# Patient Record
Sex: Female | Born: 1935 | Race: White | Hispanic: No | Smoking: Never smoker
Health system: Southern US, Community
[De-identification: ages and names within clinical notes are randomized; demographics above are authoritative.]

## PROBLEM LIST (undated history)

## (undated) DIAGNOSIS — I10 Essential (primary) hypertension: Secondary | ICD-10-CM

## (undated) DIAGNOSIS — R011 Cardiac murmur, unspecified: Secondary | ICD-10-CM

## (undated) DIAGNOSIS — R202 Paresthesia of skin: Principal | ICD-10-CM

## (undated) DIAGNOSIS — R32 Unspecified urinary incontinence: Secondary | ICD-10-CM

## (undated) DIAGNOSIS — C50919 Malignant neoplasm of unspecified site of unspecified female breast: Secondary | ICD-10-CM

## (undated) DIAGNOSIS — F329 Major depressive disorder, single episode, unspecified: Secondary | ICD-10-CM

## (undated) DIAGNOSIS — H548 Legal blindness, as defined in USA: Secondary | ICD-10-CM

## (undated) DIAGNOSIS — C801 Malignant (primary) neoplasm, unspecified: Secondary | ICD-10-CM

## (undated) DIAGNOSIS — F32A Depression, unspecified: Secondary | ICD-10-CM

## (undated) DIAGNOSIS — C419 Malignant neoplasm of bone and articular cartilage, unspecified: Secondary | ICD-10-CM

## (undated) DIAGNOSIS — D649 Anemia, unspecified: Secondary | ICD-10-CM

## (undated) DIAGNOSIS — F419 Anxiety disorder, unspecified: Secondary | ICD-10-CM

## (undated) HISTORY — PX: ABDOMINAL HYSTERECTOMY: SHX81

## (undated) HISTORY — DX: Major depressive disorder, single episode, unspecified: F32.9

## (undated) HISTORY — PX: TONSILLECTOMY: SUR1361

## (undated) HISTORY — DX: Unspecified urinary incontinence: R32

## (undated) HISTORY — PX: EYE SURGERY: SHX253

## (undated) HISTORY — DX: Depression, unspecified: F32.A

## (undated) HISTORY — DX: Paresthesia of skin: R20.2

## (undated) HISTORY — PX: BACK SURGERY: SHX140

## (undated) HISTORY — DX: Legal blindness, as defined in USA: H54.8

## (undated) HISTORY — PX: BREAST LUMPECTOMY: SHX2

## (undated) HISTORY — DX: Anxiety disorder, unspecified: F41.9

---

## 1998-03-11 ENCOUNTER — Other Ambulatory Visit: Admission: RE | Admit: 1998-03-11 | Discharge: 1998-03-11 | Payer: Self-pay | Admitting: General Surgery

## 1998-03-19 ENCOUNTER — Ambulatory Visit (HOSPITAL_COMMUNITY): Admission: RE | Admit: 1998-03-19 | Discharge: 1998-03-20 | Payer: Self-pay | Admitting: General Surgery

## 1998-03-26 ENCOUNTER — Encounter: Admission: RE | Admit: 1998-03-26 | Discharge: 1998-06-24 | Payer: Self-pay | Admitting: Radiation Oncology

## 1998-12-29 ENCOUNTER — Encounter: Payer: Self-pay | Admitting: Family Medicine

## 1998-12-29 ENCOUNTER — Ambulatory Visit (HOSPITAL_COMMUNITY): Admission: RE | Admit: 1998-12-29 | Discharge: 1998-12-29 | Payer: Self-pay | Admitting: Family Medicine

## 1999-04-05 ENCOUNTER — Emergency Department (HOSPITAL_COMMUNITY): Admission: EM | Admit: 1999-04-05 | Discharge: 1999-04-05 | Payer: Self-pay | Admitting: Emergency Medicine

## 1999-11-24 ENCOUNTER — Encounter: Admission: RE | Admit: 1999-11-24 | Discharge: 1999-11-24 | Payer: Self-pay | Admitting: General Surgery

## 1999-11-24 ENCOUNTER — Encounter: Payer: Self-pay | Admitting: General Surgery

## 1999-12-10 ENCOUNTER — Other Ambulatory Visit: Admission: RE | Admit: 1999-12-10 | Discharge: 1999-12-10 | Payer: Self-pay | Admitting: *Deleted

## 2000-02-29 ENCOUNTER — Encounter: Payer: Self-pay | Admitting: Internal Medicine

## 2000-02-29 ENCOUNTER — Encounter: Admission: RE | Admit: 2000-02-29 | Discharge: 2000-02-29 | Payer: Self-pay | Admitting: Internal Medicine

## 2000-04-26 ENCOUNTER — Encounter: Admission: RE | Admit: 2000-04-26 | Discharge: 2000-04-26 | Payer: Self-pay | Admitting: General Surgery

## 2000-04-26 ENCOUNTER — Encounter: Payer: Self-pay | Admitting: General Surgery

## 2000-11-15 ENCOUNTER — Encounter: Payer: Self-pay | Admitting: General Surgery

## 2000-11-15 ENCOUNTER — Encounter: Admission: RE | Admit: 2000-11-15 | Discharge: 2000-11-15 | Payer: Self-pay | Admitting: General Surgery

## 2001-01-12 ENCOUNTER — Encounter: Payer: Self-pay | Admitting: Neurosurgery

## 2001-01-12 ENCOUNTER — Ambulatory Visit (HOSPITAL_COMMUNITY): Admission: RE | Admit: 2001-01-12 | Discharge: 2001-01-12 | Payer: Self-pay | Admitting: Neurosurgery

## 2001-01-31 ENCOUNTER — Encounter: Admission: RE | Admit: 2001-01-31 | Discharge: 2001-05-01 | Payer: Self-pay | Admitting: Anesthesiology

## 2001-04-17 ENCOUNTER — Encounter: Payer: Self-pay | Admitting: Neurosurgery

## 2001-04-20 ENCOUNTER — Inpatient Hospital Stay (HOSPITAL_COMMUNITY): Admission: RE | Admit: 2001-04-20 | Discharge: 2001-04-21 | Payer: Self-pay | Admitting: Neurosurgery

## 2001-04-20 ENCOUNTER — Encounter: Payer: Self-pay | Admitting: Neurosurgery

## 2001-05-29 ENCOUNTER — Encounter: Payer: Self-pay | Admitting: General Surgery

## 2001-05-29 ENCOUNTER — Encounter: Admission: RE | Admit: 2001-05-29 | Discharge: 2001-05-29 | Payer: Self-pay | Admitting: General Surgery

## 2002-01-04 ENCOUNTER — Encounter: Admission: RE | Admit: 2002-01-04 | Discharge: 2002-01-04 | Payer: Self-pay | Admitting: Family Medicine

## 2002-01-04 ENCOUNTER — Encounter: Payer: Self-pay | Admitting: Family Medicine

## 2002-05-31 ENCOUNTER — Encounter: Payer: Self-pay | Admitting: General Surgery

## 2002-05-31 ENCOUNTER — Encounter: Admission: RE | Admit: 2002-05-31 | Discharge: 2002-05-31 | Payer: Self-pay | Admitting: General Surgery

## 2003-06-12 ENCOUNTER — Encounter: Payer: Self-pay | Admitting: General Surgery

## 2003-06-12 ENCOUNTER — Encounter: Admission: RE | Admit: 2003-06-12 | Discharge: 2003-06-12 | Payer: Self-pay | Admitting: General Surgery

## 2004-05-04 ENCOUNTER — Emergency Department (HOSPITAL_COMMUNITY): Admission: EM | Admit: 2004-05-04 | Discharge: 2004-05-04 | Payer: Self-pay | Admitting: Emergency Medicine

## 2004-06-15 ENCOUNTER — Encounter: Admission: RE | Admit: 2004-06-15 | Discharge: 2004-06-15 | Payer: Self-pay | Admitting: General Surgery

## 2005-06-17 ENCOUNTER — Encounter: Admission: RE | Admit: 2005-06-17 | Discharge: 2005-06-17 | Payer: Self-pay | Admitting: General Surgery

## 2005-12-16 ENCOUNTER — Encounter: Admission: RE | Admit: 2005-12-16 | Discharge: 2005-12-16 | Payer: Self-pay | Admitting: Family Medicine

## 2006-06-12 ENCOUNTER — Inpatient Hospital Stay (HOSPITAL_COMMUNITY): Admission: EM | Admit: 2006-06-12 | Discharge: 2006-06-17 | Payer: Self-pay | Admitting: Emergency Medicine

## 2006-07-12 ENCOUNTER — Encounter: Admission: RE | Admit: 2006-07-12 | Discharge: 2006-07-12 | Payer: Self-pay | Admitting: Orthopedic Surgery

## 2006-08-11 ENCOUNTER — Encounter: Admission: RE | Admit: 2006-08-11 | Discharge: 2006-08-11 | Payer: Self-pay | Admitting: General Surgery

## 2007-08-21 ENCOUNTER — Encounter: Admission: RE | Admit: 2007-08-21 | Discharge: 2007-08-21 | Payer: Self-pay | Admitting: Family Medicine

## 2008-02-23 ENCOUNTER — Encounter: Admission: RE | Admit: 2008-02-23 | Discharge: 2008-02-23 | Payer: Self-pay | Admitting: Gastroenterology

## 2008-02-26 ENCOUNTER — Encounter: Admission: RE | Admit: 2008-02-26 | Discharge: 2008-02-26 | Payer: Self-pay | Admitting: Gastroenterology

## 2008-08-21 ENCOUNTER — Encounter: Admission: RE | Admit: 2008-08-21 | Discharge: 2008-08-21 | Payer: Self-pay | Admitting: Family Medicine

## 2009-03-21 ENCOUNTER — Encounter: Admission: RE | Admit: 2009-03-21 | Discharge: 2009-03-21 | Payer: Self-pay | Admitting: Family Medicine

## 2009-08-22 ENCOUNTER — Encounter: Admission: RE | Admit: 2009-08-22 | Discharge: 2009-08-22 | Payer: Self-pay | Admitting: Family Medicine

## 2010-03-28 ENCOUNTER — Emergency Department (HOSPITAL_COMMUNITY): Admission: EM | Admit: 2010-03-28 | Discharge: 2010-03-29 | Payer: Self-pay | Admitting: Emergency Medicine

## 2010-09-02 ENCOUNTER — Encounter: Admission: RE | Admit: 2010-09-02 | Discharge: 2010-09-02 | Payer: Self-pay | Admitting: Family Medicine

## 2010-11-22 ENCOUNTER — Encounter: Payer: Self-pay | Admitting: General Surgery

## 2011-01-18 LAB — POCT I-STAT, CHEM 8
Chloride: 101 mEq/L (ref 96–112)
Creatinine, Ser: 0.6 mg/dL (ref 0.4–1.2)
Glucose, Bld: 128 mg/dL — ABNORMAL HIGH (ref 70–99)
HCT: 41 % (ref 36.0–46.0)
Hemoglobin: 13.9 g/dL (ref 12.0–15.0)
Potassium: 3.8 mEq/L (ref 3.5–5.1)
Sodium: 136 mEq/L (ref 135–145)
TCO2: 31 mmol/L (ref 0–100)

## 2011-01-18 LAB — CBC
HCT: 39.2 % (ref 36.0–46.0)
Hemoglobin: 12.8 g/dL (ref 12.0–15.0)
RBC: 4.16 MIL/uL (ref 3.87–5.11)
RDW: 15.2 % (ref 11.5–15.5)

## 2011-01-18 LAB — URINALYSIS, ROUTINE W REFLEX MICROSCOPIC
Specific Gravity, Urine: 1.008 (ref 1.005–1.030)
Urobilinogen, UA: 0.2 mg/dL (ref 0.0–1.0)
pH: 7.5 (ref 5.0–8.0)

## 2011-01-18 LAB — DIFFERENTIAL
Basophils Absolute: 0.1 10*3/uL (ref 0.0–0.1)
Eosinophils Relative: 3 % (ref 0–5)
Neutro Abs: 6 10*3/uL (ref 1.7–7.7)

## 2011-03-19 NOTE — H&P (Signed)
Alejandra, Grant NO.:  [id number]   MEDICAL RECORD NO.:  [id number]          PATIENT TYPE:  INP   LOCATION:  1509                         FACILITY:  [facility]   PHYSICIAN:  Alejandra Neither, MD      DATE OF BIRTH:  Aug 10, 1923   DATE OF ADMISSION:  06/12/2006  DATE OF DISCHARGE:                                HISTORY & PHYSICAL   CHIEF COMPLAINT:  Painful, swollen left elbow and left facial injury.   PERTINENT HISTORY:  This is an 77 year old female, legally blindly, who was  out in her neighborhood doing her normal walking and had stumbled and lost  her footing, landing onto her right elbow and facial area. The patient was  brought to [facility] emergency room for treatment. The patient denies any  loss of consciousness. States she bruised her knees but denies any other  injuries.   PAST MEDICAL HISTORY:  1. High blood pressure.  2. Osteoporosis.  3. Hyperlipidemia.  4. Legally blind.   ALLERGIES:  None known.   MEDICATIONS:  Lipitor, KCl, Norvasc, Effexor, Remeron, Lasix, Fosamax,  atenolol, lisinopril, hydrochlorothiazide, timolol eye drops.   SOCIAL HISTORY:  Denies use of alcohol or tobacco.   FAMILY HISTORY:  Noncontributory.   PHYSICAL EXAMINATION:  GENERAL:  Alert and oriented. No acute distress.  VITAL SIGNS:  Blood pressure 110/61, pulse 61, respirations 17, temperature  100.4, height 5 foot 4 inches, weight 125.  HEENT:  The patient is legally blind, has her cane. Head:  Left orbital  ecchymosis with abrasion laterally. Skull nontender, no abrasion.  NECK:  Supple.  CHEST:  Nontender dorsal lumbar spine, nontender rib cage. Good expiratory  excursion. No crepitus. Mild discomfort on deep inspiration. Lungs are  clear.  EXTREMITIES:  Bilateral knees with superficial abrasion. Range of motion is  full. No swelling. Left elbow:  3+ effusion with ecchymosis, markedly  tender, range of motion limited due to pain. Left hand:  Good grip  __________  intact. Nail bed pink and blanched quite well.   LABORATORY DATA:  X-ray revealed left elbow fracture of the olecranon with  osteopenic bone.   IMPRESSION:  Fracture of left elbow olecranon fracture, left orbital facial  contusions, abrasions bilateral knees. The patient is legally blind  __________ high blood pressure and hyperlipidemia.   PLAN:  Open reduction internal fixation, left elbow.      Alejandra Neither, MD  Electronically Signed     AC/MEDQ  D:  06/16/2006  T:  06/16/2006  Job:  [id number]

## 2011-03-19 NOTE — Discharge Summary (Signed)
Alejandra Grant, Alejandra Grant               ACCOUNT NO.:  [id number]   MEDICAL RECORD NO.:  [id number]          PATIENT TYPE:  INP   LOCATION:  1509                         FACILITY:  [facility]   PHYSICIAN:  Myrtie Neither, MD      DATE OF BIRTH:  27-Mar-1923   DATE OF ADMISSION:  06/12/2006  DATE OF DISCHARGE:  06/16/2006                                 DISCHARGE SUMMARY   ADMITTING DIAGNOSES:  1. Fractured olecranon, left elbow.  2. Contused left orbit.   DISCHARGE DIAGNOSES:  1. Fracture olecranon, left elbow.  2. Contused left orbit.   COMPLICATIONS:  None.   __________  None.   OPERATION:  Open reduction and internal fixation of left elbow olecranon  fracture.   PERTINENT HISTORY:  This is an 77 year old female, legally blind patient who  was on her normal walk in her neighborhood and missed a step and fell  landing onto her left elbow as well as hitting the left side of her face.  The patient was brought to [facility] emergency room.  The patient denies  any loss of consciousness.  She complained of pain and swelling of the left  elbow and had abrasions about the left orbit and facial area.   PERTINENT PHYSICAL:  GENERAL:  Alert and oriented in no acute distress.  HEAD:  Left orbital ecchymosis with abrasion laterally.  SKULL:  Nontender.  No abrasion.  NECK:  Supple.  BILATERAL KNEE:  With superficial abrasions.  LEFT ELBOW:  There is 3+ effusion with ecchymosis and tender range of motion  of limb due to pain.  LEFT HAND:  Good grip and __________ .   X-ray revealed olecranon fracture of left elbow with osteopenic bone.   HOSPITAL COURSE:  The patient underwent open reduction and internal fixation  of left olecranon fracture on June 13, 2006.  The patient tolerated the  procedure quite.  Postoperative course was fairly benign.  The patient was  started on ice packs and elevation.  The patient's pain presently is under  control with hydrocodone, afebrile, good finger  movement and minimal  swelling.   The patient is being discharged to continue her home medications which are  multivitamin daily, lisinopril 40 mg daily, atenolol 50 mg daily, K-Lor/Con  8 mEq b.i.d., hydrochlorothiazide 12.5 mg daily, Lasix 40 mg daily,  amlodipine 10 mg daily, doxazosin 4 mg p.o. h.s., mirtazapine 30 mg p.o.  h.s., timolol maleate 0.5% 1 drop right eye every 12 hours, Effexor 75 mg  daily and Vicodin 1 q.6h. p.r.n.   The patient will be discharged in stable and satisfactory condition.  The  patient will need ice packs to the left elbow for another 24 hours.  The  patient is encouraged to use nerf on left hand for gripping exercise.  She  is to return to the office in 10 days.  The patient's son is instructed to  call the office Monday for that appointment.      Myrtie Neither, MD  Electronically Signed     AC/MEDQ  D:  06/16/2006  T:  06/16/2006  Job:  [id number] 

## 2011-03-19 NOTE — Op Note (Signed)
Alejandra Grant, XIN NO.:  [id number]   MEDICAL RECORD NO.:  [id number]          PATIENT TYPE:  INP   LOCATION:  1509                         FACILITY:  [facility]   PHYSICIAN:  Myrtie Neither, MD      DATE OF BIRTH:  May 10, 1923   DATE OF PROCEDURE:  06/13/2006  DATE OF DISCHARGE:                                 OPERATIVE REPORT   PREOPERATIVE DIAGNOSIS:  Fracture left olecranon.   POSTOPERATIVE DIAGNOSIS:  Fracture left olecranon.   ANESTHESIA:  General   PROCEDURE:  Open reduction internal fixation with a 6-mm compression screw  left olecranon fracture; and application of posterior splint.  The patient  was taken to the operating room and was given adequate preoperative  medications and given general anesthesia and intubated.  The left elbow and  upper extremity was prepped with DuraPrep and draped in a sterile manner.  Bovie was used for hemostasis.  The tourniquet was not elevated.  A  posterior incision was made over the left elbow going through the skin and  subcutaneous tissue down through the bursal sac.  Hematoma was evacuated.  The fracture site was identified and the soft tissue removed from the  fracture site and irrigation was done.  Manipulation and reduction of the  fracture site was done.  A guidewire was placed retrograde across the  fracture site.  Debridement was done only to the proximal fragment due to  osteoporotic bone.  A 6 mm compression cancellous screw was placed across  the fracture site holding it in stable and anatomic reduction.  Copious  irrigation was done.  The wound was closed with 2-0 Vicryl for the  subcutaneous tissue, and skin staples to the skin.  A bulky compressive  dressing was applied and a posterior splint applied; and a posterior splint  applied. The patient tolerated the procedure well and went to the recovery  room in stable and satisfactory condition.      Myrtie Neither, MD  Electronically Signed     AC/MEDQ   D:  06/13/2006  T:  06/14/2006  Job:  [id number]

## 2011-03-19 NOTE — Procedures (Signed)
[facility]  Patient:    Alejandra Grant                      MRN: [id number] Proc. Date: 02/01/01 Adm. Date:  [id number] Attending:  Thyra Breed CC:         Meredith Staggers, M.D.  Reinaldo Meeker, M.D.   Procedure Report  PROCEDURE:  Lumbar epidural steroid injection.  DIAGNOSES:  Lumbar radiculopathy with underlying lumbar degenerative disk disease at multiple levels.  HISTORY:  Alejandra Grant is a 77 year old who was sent to [place] by Dr. Aliene Beams for a series of two lumbar epidural steroid injections.  The patient stated that she has a history of low back discomfort, radiating out into the left lower extremity which began about a year ago.  At that time, she had pain in her left lateral thigh which would occur early in the morning and decline as the day went on.  She was initially seen by Dr. Arsenio Loader, her primary care physician, and sent to Dr. Jimmy Footman.  At that time, an MRI was performed which demonstrated broad-based disk protrusion at L4-5, left side worse than right.  There was bilateral L5 nerve encroachment.  In addition, she had facet joint arthropathy.  She was treated conservatively, and this essentially resolved on its own.  In early 2002, she redeveloped similar symptoms, but this persisted through the course of the day and seemed much more intense and worse.  It was a sharp pain with a difference in pain that she noted down the leg.  It is increased by standing or getting in and out of cars and improved by sitting.  There is occasional numbness and tingling along the lateral aspect of the left lower extremity.  She has intermittent weakness of the left lower extremity.  She denied bowel or bladder changes but does have a history of overactive bladder and stress incontinence for which she has been cystoscoped in the past.  Her treatments have included Vioxx and Celebrex with a one week course of corticosteroids which  were not helpful and most recently, Darvocet with Vioxx.  CURRENT MEDICATIONS:  Potassium chloride, Norvasc, Lasix, ______ , Fosamax, Effexor, Remeron, and Timoptic eye drops.  ALLERGIES:  ______ .  FAMILY HISTORY:  The patient really does not know her family history.  Her mother died at birth, and she does not know her father.  Her grandparents raised her, and she lost her grandfather when she was an infant.  She did not keep in touch with her grandmother.  ACTIVE MEDICAL PROBLEMS:  History of breast cancer status post lumpectomies x 3, radiation therapy x 2, currently on ______  followed by Dr. 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  SOCIAL HISTORY:  The patient is a nonsmoker, nondrinker.  She is widowed since 29.  PAST SURGICAL HISTORY:  Breast surgeries x 3, hysterectomy, and cystoscopy.  REVIEW OF SYSTEMS:  GENERAL:  Negative.  HEAD:  Negative.  EYES:  See active medical problems.  NOSE, MOUTH, THROAT:  Negative.  EARS:  Negative. LUNGS:  Negative.  CARDIOVASCULAR:  See active medical problems.  History of hypertension x 30 years.  GI:  Recent constipation which she attributes to her medications.  GU/MUSCULOSKELETAL:  See active medical problems.  NEUROLOGIC: No history of seizure or strokes.  See HPI for pertinent positives. HEMATOLOGIC:  Negative.  CUTANEOUS:  Significant for  recurrent skin cancers which have been excised.  ENDOCRINE:  Negative.  PSYCHIATRIC:  Positive for depression.  ALLERGY/IMMUNOLOGIC:  Negative.  PHYSICAL EXAMINATION:  VITAL SIGNS:  Blood pressure 166/73, heart rate 71, respiratory rate 18, O2 saturations 100%.  Pain level is 8/10.  Temperature is 98.1.  GENERAL:  This is a pleasant thin female in no acute distress.  HEENT:  Head is normocephalic.  Eyes demonstrate a disconjugate gaze with modest pterygium left a mild  pterygium.  Extraocular movements of the left eye are intact.  Nose with patent nares.  Oropharynx demonstrates no mucosal lesions.  NECK:  Odd restriction in range of motion with carotids 2+ and symmetric without bruits.  LUNGS:  Clear.  HEART:  Regular rate and rhythm.  BREASTS/ABDOMEN/PELVIC/RECTAL:  Not performed.  BACK:  Back examination reveals scoliosis to the right with no tenderness to percussion over the vertebrae.  EXTREMITIES:  The patient demonstrates bony enlargement of the PIPs, DIPs, and first carpal and metacarpal joints of the hands as well as of the first MTPs of the feet.  Dorsalis pedis pulses and radial pulses are 2+ and symmetric.  NEUROLOGIC:  The patient is oriented x 4.  Cranial nerves 2-12 are grossly intact.  Deep tendon reflexes are symmetric in the upper and lower extremities.  Motor is significant for left EHL weakness at 4/5.  Sensory exam reveals attenuated pinprick over the lateral aspect of the left calf. Coordination is grossly intact.  MRI most recently from a few weeks ago demonstrated that she had a progression of the L4-5 area with what looks like a fragment 7 mm round impressing on the anterior thecal sac.  IMPRESSION: 1. Low back pain with lumbar radiculopathy in the L5 distribution to the left    with underlying lumbar degenerative disk disease with what looks like a    significant disk protrusion in that direction. 2. Other medical problems as outlined in her HPI, which include history of    breast cancer, hypertension, osteoarthritis, glaucoma, depression,    osteoporosis, scoliosis, retina angiomata.  DISPOSITION:  I discussed the potential risks, benefits, and limitations of a lumbar epidural steroid injection as well as her alternatives to this.  She is interested in proceeding.  DESCRIPTION OF PROCEDURE:  After informed consent was obtained, the patient  was placed in a sitting position and monitored.  Her back was prepped  with Betadine x 3.  A skin wheal was raised at L4-L5 interspace with 1% lidocaine. A 20 gauge Tuohy needle was introduced to the lumbar epidural space to loss of resistance to preservative-free normal saline.  The depth was 4 cm.  There was no CSF nor blood.  Medrol 40 mg in 8 mL of preservative-free normal saline was gently injected.  The needle was flushed with preservative-free normal saline and removed intact.  Postprocedure condition - stable.  DISCHARGE INSTRUCTIONS: 1. Resume previous diet. 2. Limitations on activities per instruction sheet. 3. Continue on current medications. 4. Follow up with me in one week for a repeat injection. DD:  02/01/01 TD:  02/01/01 Job: [id number]

## 2011-03-19 NOTE — Op Note (Signed)
[place]  Patient:    Alejandra Grant, Alejandra Grant                      MRN: [id number] Proc. Date: 04/20/01 Adm. Date:  [id number] Attending:  Gerald Dexter                           Operative Report  PREOPERATIVE DIAGNOSIS:  Herniated disk, L4-5 left.  POSTOPERATIVE DIAGNOSIS:  Herniated disk, L4-5 left.  OPERATION PERFORMED:  Left L4-5 interlaminar laminotomy for excision of herniated disk with operating microscope.  SECONDARY PROCEDURE:  Microdissection L4-5 disk and L5 nerve root left.  SURGEON:  Reinaldo Meeker, M.D.  ASSISTANT:  Julio Sicks, M.D.  ANESTHESIA:  DESCRIPTION OF PROCEDURE:  After being placed in prone position, the patients back was prepped and draped in the usual sterile fashion.  Localizing x-ray was taken prior to incision to identify the appropriate level.  A midline incision was made over the spinous processes of L4 and L5.  Using Bovie cutting current, the incision was carried down to the spinous processes. Subperiosteal dissection was then carried out along the left-sided spinous processes and lamina and the McCullough self-retaining retractor was placed for exposure.  A second x-ray was taken to confirm approach to the L4-5 level and this was correct.  Using the high speed drill, the inferior one third of the L4 lamina and medial one third of the facet joint were removed.  The drill was then used to remove the superior one third of the L5 lamina.  Residual bone and ligamentum flavum were removed in a piecemeal fashion.  The microscope was draped and brought into the field and used for the remainder of the case.  Using microdissection technique, the lateral aspect of the thecal sac and L5 nerve root were identified.  Further coagulation was carried down to the floor of the canal to identify the L4-5 disk which was found to be significantly herniated.  After coagulating on the annulus, the annulus was incised with a 15  blade.  Using pituitary rongeurs and curet, a very thorough disk space was carried out but at the same time great care was taken to avoid injury to the neural elements and this was successfully done.  At this point inspection was carried out in all directions for any evidence of residual compression and none could be identified.  Large amounts of irrigation were carried out and any bleeding controlled with bipolar coagulation and Gelfoam. The wound was then closed using interrupted Vicryl on the muscle, fascia, subcutaneous and subcuticular tissues and staples on the skin.  A sterile dressing was then applied.  The patient was extubated and taken to the recovery room in stable condition. DD:  04/20/01 TD:  04/20/01 Job: [id number]

## 2011-03-19 NOTE — Procedures (Signed)
[facility]  Patient:    Alejandra Grant, Alejandra Grant                      MRN: [id number] Proc. Date: 02/08/01 Adm. Date:  [id number] Attending:  Thyra Breed CC:         Reinaldo Meeker, M.D.                           Procedure Report  PROCEDURE:  Lumbar epidural steroid injection.  DIAGNOSIS:  Lumbar radiculopathy with underlying lumbar degenerative disk disease.  INTERVAL HISTORY:  The patient has noted no improvement after the first injection.  She is here for a repeat injection today.  She noted no untoward effects from the injection such as hyperness, jitteriness, or feeling out of sorts.  MEDICATIONS:  Unchanged.  PHYSICAL EXAMINATION:  Blood pressure 174/76, heart rate 75, respiratory rate 16, and O2 sat 100%.  Pain level was 6/10.  Temperature was 98 degrees.  BACK:  Her back shows good healing from previous injection site.  PROCEDURE:  After an informed consent was obtained, the patient was placed in the sitting position and monitored.  Her back was prepped with Betadine x3.  A skin wheal was raised at the L4-5 interspace with 1% lidocaine.  A 20-gauge Tuohy needle was introduced through the lumbar epidural space with loss of resistance to preservative-free normal saline.  There was no CSF nor blood. 80 mg of Medrol and 8 ml of preservative-free normal saline were gently injected. The needle was flushed with preservative-free normal saline and removed intact.  Postprocedure condition was stable.  DISCHARGE INSTRUCTIONS: 1. Resume previous diet. 2. Limitation of activities per instruction sheet. 3. Continue on current medications. 4. The patient plans to follow up with Reinaldo Meeker, M.D.  I advised her    that we would see her back for a third injection if requested by Reinaldo Meeker, M.D. DD:  02/08/01 TD:  02/08/01 Job: [id number]

## 2011-07-04 ENCOUNTER — Emergency Department (HOSPITAL_COMMUNITY)
Admission: EM | Admit: 2011-07-04 | Discharge: 2011-07-04 | Disposition: A | Discharge status: Home / Self Care | Payer: Medicare Other | Attending: Emergency Medicine | Admitting: Emergency Medicine

## 2011-07-04 ENCOUNTER — Emergency Department (HOSPITAL_COMMUNITY): Payer: Medicare Other

## 2011-07-04 DIAGNOSIS — R42 Dizziness and giddiness: Secondary | ICD-10-CM | POA: Insufficient documentation

## 2011-07-04 DIAGNOSIS — H269 Unspecified cataract: Secondary | ICD-10-CM | POA: Insufficient documentation

## 2011-07-04 DIAGNOSIS — R5381 Other malaise: Secondary | ICD-10-CM | POA: Insufficient documentation

## 2011-07-04 DIAGNOSIS — E781 Pure hyperglyceridemia: Secondary | ICD-10-CM | POA: Insufficient documentation

## 2011-07-04 DIAGNOSIS — Z79899 Other long term (current) drug therapy: Secondary | ICD-10-CM | POA: Insufficient documentation

## 2011-07-04 DIAGNOSIS — I10 Essential (primary) hypertension: Secondary | ICD-10-CM | POA: Insufficient documentation

## 2011-07-04 LAB — URINALYSIS, ROUTINE W REFLEX MICROSCOPIC
Bilirubin Urine: NEGATIVE
Ketones, ur: NEGATIVE mg/dL
Nitrite: NEGATIVE
Protein, ur: NEGATIVE mg/dL
Urobilinogen, UA: 0.2 mg/dL (ref 0.0–1.0)
pH: 7.5 (ref 5.0–8.0)

## 2011-07-04 LAB — COMPREHENSIVE METABOLIC PANEL
Albumin: 3.7 g/dL (ref 3.5–5.2)
BUN: 18 mg/dL (ref 6–23)
Calcium: 10.2 mg/dL (ref 8.4–10.5)
GFR calc Af Amer: >60 mL/min (ref >60)
Glucose, Bld: 156 mg/dL — ABNORMAL HIGH (ref 70–99)
Total Protein: 6.5 g/dL (ref 6.0–8.3)

## 2011-07-04 LAB — DIFFERENTIAL
Basophils Absolute: 0 10*3/uL (ref 0.0–0.1)
Lymphs Abs: 1.9 10*3/uL (ref 0.7–4.0)
Neutrophils Relative %: 54 % (ref 43–77)

## 2011-07-04 LAB — CBC
HCT: 37.1 % (ref 36.0–46.0)
Hemoglobin: 12.5 g/dL (ref 12.0–15.0)
MCV: 90.9 fL (ref 78.0–100.0)
RBC: 4.08 MIL/uL (ref 3.87–5.11)
WBC: 5.6 10*3/uL (ref 4.0–10.5)

## 2011-07-15 ENCOUNTER — Emergency Department (HOSPITAL_COMMUNITY)
Admission: EM | Admit: 2011-07-15 | Discharge: 2011-07-15 | Disposition: A | Discharge status: Home / Self Care | Payer: Medicare Other | Attending: Emergency Medicine | Admitting: Emergency Medicine

## 2011-07-15 DIAGNOSIS — E781 Pure hyperglyceridemia: Secondary | ICD-10-CM | POA: Insufficient documentation

## 2011-07-15 DIAGNOSIS — I10 Essential (primary) hypertension: Secondary | ICD-10-CM | POA: Insufficient documentation

## 2011-07-15 DIAGNOSIS — H409 Unspecified glaucoma: Secondary | ICD-10-CM | POA: Insufficient documentation

## 2011-07-15 DIAGNOSIS — M810 Age-related osteoporosis without current pathological fracture: Secondary | ICD-10-CM | POA: Insufficient documentation

## 2011-07-15 LAB — POCT I-STAT, CHEM 8
BUN: 19 mg/dL (ref 6–23)
Creatinine, Ser: 0.8 mg/dL (ref 0.50–1.10)
HCT: 39 % (ref 36.0–46.0)
Potassium: 3.6 mEq/L (ref 3.5–5.1)
Sodium: 139 mEq/L (ref 135–145)

## 2011-07-15 LAB — URINALYSIS, ROUTINE W REFLEX MICROSCOPIC
Glucose, UA: NEGATIVE mg/dL
Ketones, ur: NEGATIVE mg/dL
Leukocytes, UA: NEGATIVE
Nitrite: NEGATIVE
Protein, ur: NEGATIVE mg/dL
Urobilinogen, UA: 0.2 mg/dL (ref 0.0–1.0)

## 2011-08-05 ENCOUNTER — Other Ambulatory Visit: Payer: Self-pay | Admitting: Family Medicine

## 2011-08-05 DIAGNOSIS — Z1231 Encounter for screening mammogram for malignant neoplasm of breast: Secondary | ICD-10-CM

## 2011-09-06 ENCOUNTER — Ambulatory Visit
Admission: RE | Admit: 2011-09-06 | Discharge: 2011-09-06 | Disposition: A | Discharge status: Home / Self Care | Payer: Medicare Other | Source: Ambulatory Visit | Attending: Family Medicine | Admitting: Family Medicine

## 2011-09-06 DIAGNOSIS — Z1231 Encounter for screening mammogram for malignant neoplasm of breast: Secondary | ICD-10-CM

## 2011-11-09 DIAGNOSIS — I10 Essential (primary) hypertension: Secondary | ICD-10-CM | POA: Diagnosis not present

## 2011-11-09 DIAGNOSIS — R5383 Other fatigue: Secondary | ICD-10-CM | POA: Diagnosis not present

## 2011-11-09 DIAGNOSIS — N318 Other neuromuscular dysfunction of bladder: Secondary | ICD-10-CM | POA: Diagnosis not present

## 2011-11-09 DIAGNOSIS — E7800 Pure hypercholesterolemia, unspecified: Secondary | ICD-10-CM | POA: Diagnosis not present

## 2011-11-13 DIAGNOSIS — H3530 Unspecified macular degeneration: Secondary | ICD-10-CM | POA: Diagnosis not present

## 2011-11-13 DIAGNOSIS — E785 Hyperlipidemia, unspecified: Secondary | ICD-10-CM | POA: Diagnosis not present

## 2011-11-13 DIAGNOSIS — H5410 Blindness, one eye, low vision other eye, unspecified eyes: Secondary | ICD-10-CM | POA: Diagnosis not present

## 2011-11-13 DIAGNOSIS — R262 Difficulty in walking, not elsewhere classified: Secondary | ICD-10-CM | POA: Diagnosis not present

## 2011-11-13 DIAGNOSIS — I10 Essential (primary) hypertension: Secondary | ICD-10-CM | POA: Diagnosis not present

## 2011-11-15 DIAGNOSIS — I10 Essential (primary) hypertension: Secondary | ICD-10-CM | POA: Diagnosis not present

## 2011-11-15 DIAGNOSIS — H3530 Unspecified macular degeneration: Secondary | ICD-10-CM | POA: Diagnosis not present

## 2011-11-15 DIAGNOSIS — R262 Difficulty in walking, not elsewhere classified: Secondary | ICD-10-CM | POA: Diagnosis not present

## 2011-11-15 DIAGNOSIS — H5410 Blindness, one eye, low vision other eye, unspecified eyes: Secondary | ICD-10-CM | POA: Diagnosis not present

## 2011-11-15 DIAGNOSIS — E785 Hyperlipidemia, unspecified: Secondary | ICD-10-CM | POA: Diagnosis not present

## 2011-11-16 DIAGNOSIS — H5410 Blindness, one eye, low vision other eye, unspecified eyes: Secondary | ICD-10-CM | POA: Diagnosis not present

## 2011-11-16 DIAGNOSIS — R262 Difficulty in walking, not elsewhere classified: Secondary | ICD-10-CM | POA: Diagnosis not present

## 2011-11-16 DIAGNOSIS — E785 Hyperlipidemia, unspecified: Secondary | ICD-10-CM | POA: Diagnosis not present

## 2011-11-16 DIAGNOSIS — I10 Essential (primary) hypertension: Secondary | ICD-10-CM | POA: Diagnosis not present

## 2011-11-16 DIAGNOSIS — H3530 Unspecified macular degeneration: Secondary | ICD-10-CM | POA: Diagnosis not present

## 2011-11-18 DIAGNOSIS — H5410 Blindness, one eye, low vision other eye, unspecified eyes: Secondary | ICD-10-CM | POA: Diagnosis not present

## 2011-11-18 DIAGNOSIS — E785 Hyperlipidemia, unspecified: Secondary | ICD-10-CM | POA: Diagnosis not present

## 2011-11-18 DIAGNOSIS — H3530 Unspecified macular degeneration: Secondary | ICD-10-CM | POA: Diagnosis not present

## 2011-11-18 DIAGNOSIS — R262 Difficulty in walking, not elsewhere classified: Secondary | ICD-10-CM | POA: Diagnosis not present

## 2011-11-18 DIAGNOSIS — I10 Essential (primary) hypertension: Secondary | ICD-10-CM | POA: Diagnosis not present

## 2011-11-19 DIAGNOSIS — H5410 Blindness, one eye, low vision other eye, unspecified eyes: Secondary | ICD-10-CM | POA: Diagnosis not present

## 2011-11-19 DIAGNOSIS — E785 Hyperlipidemia, unspecified: Secondary | ICD-10-CM | POA: Diagnosis not present

## 2011-11-19 DIAGNOSIS — H3530 Unspecified macular degeneration: Secondary | ICD-10-CM | POA: Diagnosis not present

## 2011-11-19 DIAGNOSIS — I10 Essential (primary) hypertension: Secondary | ICD-10-CM | POA: Diagnosis not present

## 2011-11-19 DIAGNOSIS — R262 Difficulty in walking, not elsewhere classified: Secondary | ICD-10-CM | POA: Diagnosis not present

## 2011-11-20 DIAGNOSIS — E785 Hyperlipidemia, unspecified: Secondary | ICD-10-CM | POA: Diagnosis not present

## 2011-11-20 DIAGNOSIS — H3530 Unspecified macular degeneration: Secondary | ICD-10-CM | POA: Diagnosis not present

## 2011-11-20 DIAGNOSIS — I10 Essential (primary) hypertension: Secondary | ICD-10-CM | POA: Diagnosis not present

## 2011-11-20 DIAGNOSIS — R262 Difficulty in walking, not elsewhere classified: Secondary | ICD-10-CM | POA: Diagnosis not present

## 2011-11-20 DIAGNOSIS — H5410 Blindness, one eye, low vision other eye, unspecified eyes: Secondary | ICD-10-CM | POA: Diagnosis not present

## 2011-11-22 DIAGNOSIS — H3530 Unspecified macular degeneration: Secondary | ICD-10-CM | POA: Diagnosis not present

## 2011-11-22 DIAGNOSIS — I10 Essential (primary) hypertension: Secondary | ICD-10-CM | POA: Diagnosis not present

## 2011-11-22 DIAGNOSIS — H5410 Blindness, one eye, low vision other eye, unspecified eyes: Secondary | ICD-10-CM | POA: Diagnosis not present

## 2011-11-22 DIAGNOSIS — E785 Hyperlipidemia, unspecified: Secondary | ICD-10-CM | POA: Diagnosis not present

## 2011-11-22 DIAGNOSIS — R262 Difficulty in walking, not elsewhere classified: Secondary | ICD-10-CM | POA: Diagnosis not present

## 2011-11-23 DIAGNOSIS — H3530 Unspecified macular degeneration: Secondary | ICD-10-CM | POA: Diagnosis not present

## 2011-11-23 DIAGNOSIS — E785 Hyperlipidemia, unspecified: Secondary | ICD-10-CM | POA: Diagnosis not present

## 2011-11-23 DIAGNOSIS — R262 Difficulty in walking, not elsewhere classified: Secondary | ICD-10-CM | POA: Diagnosis not present

## 2011-11-23 DIAGNOSIS — I10 Essential (primary) hypertension: Secondary | ICD-10-CM | POA: Diagnosis not present

## 2011-11-23 DIAGNOSIS — H5410 Blindness, one eye, low vision other eye, unspecified eyes: Secondary | ICD-10-CM | POA: Diagnosis not present

## 2011-11-25 DIAGNOSIS — H3530 Unspecified macular degeneration: Secondary | ICD-10-CM | POA: Diagnosis not present

## 2011-11-25 DIAGNOSIS — H5410 Blindness, one eye, low vision other eye, unspecified eyes: Secondary | ICD-10-CM | POA: Diagnosis not present

## 2011-11-25 DIAGNOSIS — R262 Difficulty in walking, not elsewhere classified: Secondary | ICD-10-CM | POA: Diagnosis not present

## 2011-11-25 DIAGNOSIS — E785 Hyperlipidemia, unspecified: Secondary | ICD-10-CM | POA: Diagnosis not present

## 2011-11-25 DIAGNOSIS — I10 Essential (primary) hypertension: Secondary | ICD-10-CM | POA: Diagnosis not present

## 2011-11-26 ENCOUNTER — Encounter (HOSPITAL_COMMUNITY): Payer: Self-pay | Admitting: *Deleted

## 2011-11-26 ENCOUNTER — Emergency Department (HOSPITAL_COMMUNITY)
Admission: EM | Admit: 2011-11-26 | Discharge: 2011-11-26 | Disposition: A | Discharge status: Home / Self Care | Payer: Medicare Other | Attending: Emergency Medicine | Admitting: Emergency Medicine

## 2011-11-26 DIAGNOSIS — R040 Epistaxis: Secondary | ICD-10-CM | POA: Insufficient documentation

## 2011-11-26 DIAGNOSIS — R42 Dizziness and giddiness: Secondary | ICD-10-CM | POA: Diagnosis not present

## 2011-11-26 DIAGNOSIS — I10 Essential (primary) hypertension: Secondary | ICD-10-CM | POA: Insufficient documentation

## 2011-11-26 DIAGNOSIS — Z79899 Other long term (current) drug therapy: Secondary | ICD-10-CM | POA: Insufficient documentation

## 2011-11-26 DIAGNOSIS — R011 Cardiac murmur, unspecified: Secondary | ICD-10-CM | POA: Diagnosis not present

## 2011-11-26 DIAGNOSIS — H548 Legal blindness, as defined in USA: Secondary | ICD-10-CM | POA: Diagnosis not present

## 2011-11-26 HISTORY — DX: Essential (primary) hypertension: I10

## 2011-11-26 LAB — URINALYSIS, ROUTINE W REFLEX MICROSCOPIC
Ketones, ur: NEGATIVE mg/dL
Leukocytes, UA: NEGATIVE
Protein, ur: NEGATIVE mg/dL
Urobilinogen, UA: 0.2 mg/dL (ref 0.0–1.0)

## 2011-11-26 LAB — BASIC METABOLIC PANEL
Calcium: 10.9 mg/dL — ABNORMAL HIGH (ref 8.4–10.5)
Creatinine, Ser: 0.7 mg/dL (ref 0.50–1.10)
GFR calc non Af Amer: 76 mL/min — ABNORMAL LOW (ref >90)
Sodium: 137 mEq/L (ref 135–145)

## 2011-11-26 MED ORDER — HYDRALAZINE HCL 10 MG PO TABS: 10.0000 mg | ORAL_TABLET | Freq: Three times a day (TID) | ORAL | Status: DC

## 2011-11-26 MED ORDER — LISINOPRIL 10 MG PO TABS
10.0000 mg | ORAL_TABLET | Freq: Once | ORAL | Status: AC
  Administered 2011-11-26: 10 mg via ORAL
  Filled 2011-11-26: qty 1

## 2011-11-26 NOTE — ED Provider Notes (Addendum)
History     CSN: [id number]  Arrival date & time 11/26/11  1057   First MD Initiated Contact with Patient 11/26/11 1121      Chief Complaint  Patient presents with  . Hypertension    pt c/o HTN, reports new medication- hydralazine- not helping BP. also c/o tingling in left toes and leg x's 1 week. pt denies complaints of pain.     (Consider location/radiation/quality/duration/timing/severity/associated sxs/prior treatment) The history is provided by the patient.   the patient is an 78 year old, female, who is legally blind and has hypertension, who presents to the emergency department complaining of lightheadedness.  For lightheadedness, caused her to go to the EMS station with a reported a systolic blood pressure greater than 200.  Therefore, she came to the emergency department for further evaluation.  She denies chest pain.  She denies headache, nausea, vomiting, or weakness.  She denies cough, shortness of breath, abdominal pain, or dysuria.  She has not missed any of her medications.her pcp is cynthia white.  Past Medical History  Diagnosis Date  . Hypertension     History reviewed. No pertinent past surgical history.  History reviewed. No pertinent family history.  History  Substance Use Topics  . Smoking status: Not on file  . Smokeless tobacco: Not on file  . Alcohol Use: No    OB History    Grav Para Term Preterm Abortions TAB SAB Ect Mult Living                  Review of Systems  Constitutional: Negative for fever.  HENT: Positive for nosebleeds. Negative for sore throat.        Epistaxis this morning, resolved  Respiratory: Negative for cough and shortness of breath.   Cardiovascular: Negative for chest pain.  Gastrointestinal: Negative for nausea and vomiting.  Genitourinary: Negative for dysuria.  Neurological: Positive for light-headedness. Negative for headaches.  Psychiatric/Behavioral: Negative for confusion.  All other systems reviewed and are  negative.    Allergies  Aspirin  Home Medications   Current Outpatient Rx  Name Route Sig Dispense Refill  . NORVASC PO Oral Take 1 tablet by mouth 2 (two) times daily.    Marland Kitchen DOXAZOSIN MESYLATE 4 MG PO TABS Oral Take 4 mg by mouth at bedtime.    Marland Kitchen HYDRALAZINE HCL 10 MG PO TABS Oral Take 5 mg by mouth 2 (two) times daily.    Marland Kitchen LISINOPRIL-HYDROCHLOROTHIAZIDE PO Oral Take 1 tablet by mouth daily.    Marland Kitchen POTASSIUM CHLORIDE ER PO Oral Take 1 tablet by mouth daily.    Marland Kitchen TIMOLOL MALEATE 0.5 % OP SOLN Both Eyes Place 1 drop into both eyes 2 (two) times daily.      BP 157/76  Pulse 55  Temp(Src) 98.7 F (37.1 C) (Oral)  Resp 12  Wt 122 lb (55.339 kg)  SpO2 98%  Physical Exam  Constitutional: She is oriented to person, place, and time. She appears well-developed and well-nourished.  HENT:  Head: Normocephalic and atraumatic.  Eyes: Pupils are equal, round, and reactive to light.  Neck: Normal range of motion.       Carotid bruit on the right  Cardiovascular: Normal rate and regular rhythm.   Murmur heard. Pulmonary/Chest: Effort normal and breath sounds normal. No respiratory distress. She has no wheezes. She has no rales.  Abdominal: Soft. She exhibits no distension and no mass. There is no tenderness. There is no rebound and no guarding.  Musculoskeletal: Normal range of  motion. She exhibits no edema and no tenderness.  Neurological: She is alert and oriented to person, place, and time. No cranial nerve deficit.  Skin: Skin is warm and dry. No rash noted. No erythema.  Psychiatric: She has a normal mood and affect. Her behavior is normal.    ED Course  Procedures (including critical care time) 78 year old female, with significant hypertension, and lightheadedness.  Presents to the ER for further evaluation.  She denies pain anywhere.  She has no other symptoms.  We will perform blood testing, urinalysis, to see if there were signs of endorgan damage.  Were an etiology for an  elevated blood pressure.  Labs Reviewed  BASIC METABOLIC PANEL - Abnormal; Notable for the following:    Calcium 10.9 (*)    GFR calc non Af Amer 76 (*)    GFR calc Af Amer 88 (*)    All other components within normal limits  URINALYSIS, ROUTINE W REFLEX MICROSCOPIC   No results found.   No diagnosis found.  Feels better.  sbp 157  MDM  htn - bp controlled. No end organ damage.        Nicholes Stairs, MD 11/26/11 1424  Nicholes Stairs, MD 11/29/11 [phone]

## 2011-11-26 NOTE — ED Notes (Signed)
Pt reports increased blood pressure with change in blood pressure medication over the past week; reports light nose bleed this morning with lightheadedness and went to fire station for bp check; reports bp was 200/100 so she came to er for evaluation

## 2011-11-26 NOTE — ED Notes (Signed)
MD at bedside. 

## 2011-11-28 DIAGNOSIS — I10 Essential (primary) hypertension: Secondary | ICD-10-CM | POA: Diagnosis not present

## 2011-11-29 DIAGNOSIS — E785 Hyperlipidemia, unspecified: Secondary | ICD-10-CM | POA: Diagnosis not present

## 2011-11-29 DIAGNOSIS — R262 Difficulty in walking, not elsewhere classified: Secondary | ICD-10-CM | POA: Diagnosis not present

## 2011-11-29 DIAGNOSIS — H3530 Unspecified macular degeneration: Secondary | ICD-10-CM | POA: Diagnosis not present

## 2011-11-29 DIAGNOSIS — I10 Essential (primary) hypertension: Secondary | ICD-10-CM | POA: Diagnosis not present

## 2011-11-29 DIAGNOSIS — H5410 Blindness, one eye, low vision other eye, unspecified eyes: Secondary | ICD-10-CM | POA: Diagnosis not present

## 2011-11-30 DIAGNOSIS — I10 Essential (primary) hypertension: Secondary | ICD-10-CM | POA: Diagnosis not present

## 2011-11-30 DIAGNOSIS — H5410 Blindness, one eye, low vision other eye, unspecified eyes: Secondary | ICD-10-CM | POA: Diagnosis not present

## 2011-11-30 DIAGNOSIS — R262 Difficulty in walking, not elsewhere classified: Secondary | ICD-10-CM | POA: Diagnosis not present

## 2011-11-30 DIAGNOSIS — E785 Hyperlipidemia, unspecified: Secondary | ICD-10-CM | POA: Diagnosis not present

## 2011-11-30 DIAGNOSIS — H3530 Unspecified macular degeneration: Secondary | ICD-10-CM | POA: Diagnosis not present

## 2011-12-01 DIAGNOSIS — H3530 Unspecified macular degeneration: Secondary | ICD-10-CM | POA: Diagnosis not present

## 2011-12-01 DIAGNOSIS — I10 Essential (primary) hypertension: Secondary | ICD-10-CM | POA: Diagnosis not present

## 2011-12-01 DIAGNOSIS — E785 Hyperlipidemia, unspecified: Secondary | ICD-10-CM | POA: Diagnosis not present

## 2011-12-01 DIAGNOSIS — R262 Difficulty in walking, not elsewhere classified: Secondary | ICD-10-CM | POA: Diagnosis not present

## 2011-12-01 DIAGNOSIS — H5410 Blindness, one eye, low vision other eye, unspecified eyes: Secondary | ICD-10-CM | POA: Diagnosis not present

## 2011-12-02 DIAGNOSIS — E785 Hyperlipidemia, unspecified: Secondary | ICD-10-CM | POA: Diagnosis not present

## 2011-12-02 DIAGNOSIS — H5410 Blindness, one eye, low vision other eye, unspecified eyes: Secondary | ICD-10-CM | POA: Diagnosis not present

## 2011-12-02 DIAGNOSIS — R262 Difficulty in walking, not elsewhere classified: Secondary | ICD-10-CM | POA: Diagnosis not present

## 2011-12-02 DIAGNOSIS — I10 Essential (primary) hypertension: Secondary | ICD-10-CM | POA: Diagnosis not present

## 2011-12-02 DIAGNOSIS — H3530 Unspecified macular degeneration: Secondary | ICD-10-CM | POA: Diagnosis not present

## 2011-12-07 DIAGNOSIS — H5410 Blindness, one eye, low vision other eye, unspecified eyes: Secondary | ICD-10-CM | POA: Diagnosis not present

## 2011-12-07 DIAGNOSIS — R262 Difficulty in walking, not elsewhere classified: Secondary | ICD-10-CM | POA: Diagnosis not present

## 2011-12-07 DIAGNOSIS — I10 Essential (primary) hypertension: Secondary | ICD-10-CM | POA: Diagnosis not present

## 2011-12-07 DIAGNOSIS — H3530 Unspecified macular degeneration: Secondary | ICD-10-CM | POA: Diagnosis not present

## 2011-12-07 DIAGNOSIS — E785 Hyperlipidemia, unspecified: Secondary | ICD-10-CM | POA: Diagnosis not present

## 2011-12-08 DIAGNOSIS — E785 Hyperlipidemia, unspecified: Secondary | ICD-10-CM | POA: Diagnosis not present

## 2011-12-08 DIAGNOSIS — I10 Essential (primary) hypertension: Secondary | ICD-10-CM | POA: Diagnosis not present

## 2011-12-08 DIAGNOSIS — H5410 Blindness, one eye, low vision other eye, unspecified eyes: Secondary | ICD-10-CM | POA: Diagnosis not present

## 2011-12-08 DIAGNOSIS — R262 Difficulty in walking, not elsewhere classified: Secondary | ICD-10-CM | POA: Diagnosis not present

## 2011-12-08 DIAGNOSIS — H3530 Unspecified macular degeneration: Secondary | ICD-10-CM | POA: Diagnosis not present

## 2011-12-09 DIAGNOSIS — R262 Difficulty in walking, not elsewhere classified: Secondary | ICD-10-CM | POA: Diagnosis not present

## 2011-12-09 DIAGNOSIS — H5410 Blindness, one eye, low vision other eye, unspecified eyes: Secondary | ICD-10-CM | POA: Diagnosis not present

## 2011-12-09 DIAGNOSIS — I10 Essential (primary) hypertension: Secondary | ICD-10-CM | POA: Diagnosis not present

## 2011-12-09 DIAGNOSIS — H3530 Unspecified macular degeneration: Secondary | ICD-10-CM | POA: Diagnosis not present

## 2011-12-09 DIAGNOSIS — E785 Hyperlipidemia, unspecified: Secondary | ICD-10-CM | POA: Diagnosis not present

## 2012-02-08 DIAGNOSIS — I10 Essential (primary) hypertension: Secondary | ICD-10-CM | POA: Diagnosis not present

## 2012-02-10 DIAGNOSIS — H31019 Macula scars of posterior pole (postinflammatory) (post-traumatic), unspecified eye: Secondary | ICD-10-CM | POA: Diagnosis not present

## 2012-02-10 DIAGNOSIS — H353290 Exudative age-related macular degeneration, unspecified eye, stage unspecified: Secondary | ICD-10-CM | POA: Diagnosis not present

## 2012-02-10 DIAGNOSIS — H409 Unspecified glaucoma: Secondary | ICD-10-CM | POA: Diagnosis not present

## 2012-02-10 DIAGNOSIS — H4010X0 Unspecified open-angle glaucoma, stage unspecified: Secondary | ICD-10-CM | POA: Diagnosis not present

## 2012-02-10 DIAGNOSIS — D1809 Hemangioma of other sites: Secondary | ICD-10-CM | POA: Diagnosis not present

## 2012-07-14 DIAGNOSIS — H6120 Impacted cerumen, unspecified ear: Secondary | ICD-10-CM | POA: Diagnosis not present

## 2012-07-14 DIAGNOSIS — Z23 Encounter for immunization: Secondary | ICD-10-CM | POA: Diagnosis not present

## 2012-07-14 DIAGNOSIS — I10 Essential (primary) hypertension: Secondary | ICD-10-CM | POA: Diagnosis not present

## 2012-07-14 DIAGNOSIS — E785 Hyperlipidemia, unspecified: Secondary | ICD-10-CM | POA: Diagnosis not present

## 2012-07-27 DIAGNOSIS — H353290 Exudative age-related macular degeneration, unspecified eye, stage unspecified: Secondary | ICD-10-CM | POA: Diagnosis not present

## 2012-07-27 DIAGNOSIS — H31019 Macula scars of posterior pole (postinflammatory) (post-traumatic), unspecified eye: Secondary | ICD-10-CM | POA: Diagnosis not present

## 2012-07-27 DIAGNOSIS — D1809 Hemangioma of other sites: Secondary | ICD-10-CM | POA: Diagnosis not present

## 2012-07-27 DIAGNOSIS — H409 Unspecified glaucoma: Secondary | ICD-10-CM | POA: Diagnosis not present

## 2012-07-27 DIAGNOSIS — H4010X0 Unspecified open-angle glaucoma, stage unspecified: Secondary | ICD-10-CM | POA: Diagnosis not present

## 2012-08-14 ENCOUNTER — Other Ambulatory Visit: Payer: Self-pay | Admitting: Family Medicine

## 2012-08-14 DIAGNOSIS — Z1231 Encounter for screening mammogram for malignant neoplasm of breast: Secondary | ICD-10-CM

## 2012-08-21 DIAGNOSIS — H9190 Unspecified hearing loss, unspecified ear: Secondary | ICD-10-CM | POA: Diagnosis not present

## 2012-09-08 ENCOUNTER — Ambulatory Visit
Admission: RE | Admit: 2012-09-08 | Discharge: 2012-09-08 | Disposition: A | Discharge status: Home / Self Care | Payer: Medicare Other | Source: Ambulatory Visit | Attending: Family Medicine | Admitting: Family Medicine

## 2012-09-08 DIAGNOSIS — Z1231 Encounter for screening mammogram for malignant neoplasm of breast: Secondary | ICD-10-CM | POA: Diagnosis not present

## 2012-09-11 DIAGNOSIS — H903 Sensorineural hearing loss, bilateral: Secondary | ICD-10-CM | POA: Diagnosis not present

## 2012-09-13 ENCOUNTER — Other Ambulatory Visit: Payer: Self-pay | Admitting: Family Medicine

## 2012-09-13 DIAGNOSIS — R928 Other abnormal and inconclusive findings on diagnostic imaging of breast: Secondary | ICD-10-CM

## 2012-09-27 ENCOUNTER — Ambulatory Visit
Admission: RE | Admit: 2012-09-27 | Discharge: 2012-09-27 | Disposition: A | Discharge status: Home / Self Care | Payer: Medicare Other | Source: Ambulatory Visit | Attending: Family Medicine | Admitting: Family Medicine

## 2012-09-27 DIAGNOSIS — R928 Other abnormal and inconclusive findings on diagnostic imaging of breast: Secondary | ICD-10-CM | POA: Diagnosis not present

## 2012-10-13 DIAGNOSIS — R159 Full incontinence of feces: Secondary | ICD-10-CM | POA: Diagnosis not present

## 2012-11-10 DIAGNOSIS — R159 Full incontinence of feces: Secondary | ICD-10-CM | POA: Diagnosis not present

## 2012-11-10 DIAGNOSIS — K5901 Slow transit constipation: Secondary | ICD-10-CM | POA: Diagnosis not present

## 2012-12-07 DIAGNOSIS — K5901 Slow transit constipation: Secondary | ICD-10-CM | POA: Diagnosis not present

## 2012-12-07 DIAGNOSIS — R159 Full incontinence of feces: Secondary | ICD-10-CM | POA: Diagnosis not present

## 2013-01-08 DIAGNOSIS — R159 Full incontinence of feces: Secondary | ICD-10-CM | POA: Diagnosis not present

## 2013-01-08 DIAGNOSIS — K5900 Constipation, unspecified: Secondary | ICD-10-CM | POA: Diagnosis not present

## 2013-01-25 DIAGNOSIS — G2581 Restless legs syndrome: Secondary | ICD-10-CM | POA: Diagnosis not present

## 2013-01-25 DIAGNOSIS — L600 Ingrowing nail: Secondary | ICD-10-CM | POA: Diagnosis not present

## 2013-01-25 DIAGNOSIS — I10 Essential (primary) hypertension: Secondary | ICD-10-CM | POA: Diagnosis not present

## 2013-01-25 DIAGNOSIS — E785 Hyperlipidemia, unspecified: Secondary | ICD-10-CM | POA: Diagnosis not present

## 2013-01-30 DIAGNOSIS — H6120 Impacted cerumen, unspecified ear: Secondary | ICD-10-CM | POA: Diagnosis not present

## 2013-01-30 DIAGNOSIS — M7710 Lateral epicondylitis, unspecified elbow: Secondary | ICD-10-CM | POA: Diagnosis not present

## 2013-01-30 DIAGNOSIS — H9190 Unspecified hearing loss, unspecified ear: Secondary | ICD-10-CM | POA: Diagnosis not present

## 2013-01-30 DIAGNOSIS — L723 Sebaceous cyst: Secondary | ICD-10-CM | POA: Diagnosis not present

## 2013-02-02 DIAGNOSIS — N3946 Mixed incontinence: Secondary | ICD-10-CM | POA: Diagnosis not present

## 2013-02-02 DIAGNOSIS — N390 Urinary tract infection, site not specified: Secondary | ICD-10-CM | POA: Diagnosis not present

## 2013-02-15 DIAGNOSIS — H353290 Exudative age-related macular degeneration, unspecified eye, stage unspecified: Secondary | ICD-10-CM | POA: Diagnosis not present

## 2013-02-15 DIAGNOSIS — H31019 Macula scars of posterior pole (postinflammatory) (post-traumatic), unspecified eye: Secondary | ICD-10-CM | POA: Diagnosis not present

## 2013-02-15 DIAGNOSIS — D1809 Hemangioma of other sites: Secondary | ICD-10-CM | POA: Diagnosis not present

## 2013-02-19 ENCOUNTER — Other Ambulatory Visit: Payer: Self-pay | Admitting: Family Medicine

## 2013-02-19 DIAGNOSIS — R921 Mammographic calcification found on diagnostic imaging of breast: Secondary | ICD-10-CM

## 2013-02-26 DIAGNOSIS — R159 Full incontinence of feces: Secondary | ICD-10-CM | POA: Diagnosis not present

## 2013-02-26 DIAGNOSIS — K5901 Slow transit constipation: Secondary | ICD-10-CM | POA: Diagnosis not present

## 2013-03-02 DIAGNOSIS — D1739 Benign lipomatous neoplasm of skin and subcutaneous tissue of other sites: Secondary | ICD-10-CM | POA: Diagnosis not present

## 2013-03-02 DIAGNOSIS — L723 Sebaceous cyst: Secondary | ICD-10-CM | POA: Diagnosis not present

## 2013-03-02 DIAGNOSIS — D481 Neoplasm of uncertain behavior of connective and other soft tissue: Secondary | ICD-10-CM | POA: Diagnosis not present

## 2013-04-02 ENCOUNTER — Ambulatory Visit
Admission: RE | Admit: 2013-04-02 | Discharge: 2013-04-02 | Disposition: A | Discharge status: Home / Self Care | Payer: Medicare Other | Source: Ambulatory Visit | Attending: Family Medicine | Admitting: Family Medicine

## 2013-04-02 DIAGNOSIS — R921 Mammographic calcification found on diagnostic imaging of breast: Secondary | ICD-10-CM

## 2013-04-02 DIAGNOSIS — Z853 Personal history of malignant neoplasm of breast: Secondary | ICD-10-CM | POA: Diagnosis not present

## 2013-04-09 ENCOUNTER — Other Ambulatory Visit: Payer: Self-pay | Admitting: Family Medicine

## 2013-04-09 DIAGNOSIS — R921 Mammographic calcification found on diagnostic imaging of breast: Secondary | ICD-10-CM

## 2013-04-18 ENCOUNTER — Ambulatory Visit
Admission: RE | Admit: 2013-04-18 | Discharge: 2013-04-18 | Disposition: A | Discharge status: Home / Self Care | Payer: Medicare Other | Source: Ambulatory Visit | Attending: Family Medicine | Admitting: Family Medicine

## 2013-04-18 ENCOUNTER — Other Ambulatory Visit (HOSPITAL_COMMUNITY): Payer: Self-pay | Admitting: Radiology

## 2013-04-18 DIAGNOSIS — D0590 Unspecified type of carcinoma in situ of unspecified breast: Secondary | ICD-10-CM | POA: Diagnosis not present

## 2013-04-18 DIAGNOSIS — R928 Other abnormal and inconclusive findings on diagnostic imaging of breast: Secondary | ICD-10-CM | POA: Diagnosis not present

## 2013-04-18 DIAGNOSIS — R921 Mammographic calcification found on diagnostic imaging of breast: Secondary | ICD-10-CM

## 2013-04-18 DIAGNOSIS — C50919 Malignant neoplasm of unspecified site of unspecified female breast: Secondary | ICD-10-CM | POA: Diagnosis not present

## 2013-04-20 ENCOUNTER — Telehealth: Payer: Self-pay | Admitting: *Deleted

## 2013-04-20 DIAGNOSIS — H6120 Impacted cerumen, unspecified ear: Secondary | ICD-10-CM | POA: Diagnosis not present

## 2013-04-20 DIAGNOSIS — C50411 Malignant neoplasm of upper-outer quadrant of right female breast: Secondary | ICD-10-CM | POA: Insufficient documentation

## 2013-04-20 DIAGNOSIS — H902 Conductive hearing loss, unspecified: Secondary | ICD-10-CM | POA: Diagnosis not present

## 2013-04-20 NOTE — Telephone Encounter (Signed)
Called and spoke with patient's son Tawanna Cooler to confirm [facility] appt. For 04/25/13 at 12N.  Instructions and contact information given.  No further questions at this time.

## 2013-04-25 ENCOUNTER — Telehealth: Payer: Self-pay | Admitting: Oncology

## 2013-04-25 ENCOUNTER — Encounter: Payer: Self-pay | Admitting: Oncology

## 2013-04-25 ENCOUNTER — Ambulatory Visit
Admission: RE | Admit: 2013-04-25 | Discharge: 2013-04-25 | Disposition: A | Discharge status: Home / Self Care | Payer: Medicare Other | Source: Ambulatory Visit | Attending: Radiation Oncology | Admitting: Radiation Oncology

## 2013-04-25 ENCOUNTER — Ambulatory Visit: Payer: Medicare Other

## 2013-04-25 ENCOUNTER — Ambulatory Visit (HOSPITAL_BASED_OUTPATIENT_CLINIC_OR_DEPARTMENT_OTHER): Payer: Medicare Other | Admitting: Oncology

## 2013-04-25 ENCOUNTER — Other Ambulatory Visit (HOSPITAL_BASED_OUTPATIENT_CLINIC_OR_DEPARTMENT_OTHER): Payer: Medicare Other | Admitting: Lab

## 2013-04-25 ENCOUNTER — Ambulatory Visit: Payer: Medicare Other | Admitting: Physical Therapy

## 2013-04-25 ENCOUNTER — Encounter: Payer: Self-pay | Admitting: *Deleted

## 2013-04-25 ENCOUNTER — Encounter (INDEPENDENT_AMBULATORY_CARE_PROVIDER_SITE_OTHER): Payer: Self-pay | Admitting: General Surgery

## 2013-04-25 ENCOUNTER — Ambulatory Visit (HOSPITAL_BASED_OUTPATIENT_CLINIC_OR_DEPARTMENT_OTHER): Payer: Medicare Other | Admitting: General Surgery

## 2013-04-25 VITALS — BP 163/70 | HR 67 | Temp 98.9°F | Resp 20 | Ht 64.0 in | Wt 117.3 lb

## 2013-04-25 DIAGNOSIS — C50411 Malignant neoplasm of upper-outer quadrant of right female breast: Secondary | ICD-10-CM

## 2013-04-25 DIAGNOSIS — C50419 Malignant neoplasm of upper-outer quadrant of unspecified female breast: Secondary | ICD-10-CM

## 2013-04-25 LAB — COMPREHENSIVE METABOLIC PANEL (CC13)
ALT: 33 U/L (ref 0–55)
AST: 43 U/L — ABNORMAL HIGH (ref 5–34)
CO2: 29 mEq/L (ref 22–29)
Creatinine: 0.8 mg/dL (ref 0.6–1.1)
Sodium: 139 mEq/L (ref 136–145)
Total Bilirubin: 0.3 mg/dL (ref 0.20–1.20)
Total Protein: 6.7 g/dL (ref 6.4–8.3)

## 2013-04-25 LAB — CBC WITH DIFFERENTIAL/PLATELET
BASO%: 0.7 % (ref 0.0–2.0)
EOS%: 1.6 % (ref 0.0–7.0)
HCT: 37 % (ref 34.8–46.6)
LYMPH%: 29.6 % (ref 14.0–49.7)
MCH: 30.9 pg (ref 25.1–34.0)
MCHC: 34 g/dL (ref 31.5–36.0)
MONO#: 0.7 10*3/uL (ref 0.1–0.9)
NEUT%: 57.9 % (ref 38.4–76.8)
Platelets: 241 10*3/uL (ref 145–400)
RBC: 4.07 10*6/uL (ref 3.70–5.45)
WBC: 6.8 10*3/uL (ref 3.9–10.3)
lymph#: 2 10*3/uL (ref 0.9–3.3)

## 2013-04-25 NOTE — Progress Notes (Signed)
Checked in new pt with no financial concerns. °

## 2013-04-25 NOTE — Progress Notes (Addendum)
Patient ID: Alejandra Grant, female   DOB: Nov 15, 1922, 79 y.o.   MRN: [id number]  No chief complaint on file.   HPI Alejandra Grant is a 79 y.o. female.  She is referred by Dr. Anselmo Pickler at the [facility] for evaluation of another invasive cancer of the right breast laterally. Her primary care physician is Dr. Laurann Montana. She is being evaluated in the in the [facility] today by Dr. Darnelle Catalan, Dr. Mitzi Hansen, and me.  This patient is a very elderly and is here with her 2 sons. She is legally blind but is otherwise independent and ambulatory. Under treatment for hypertension. Has had a lumbar laminectomy and a fractured elbow and is followed with a spastic bladder.  She has had 3 prior breast cancers. In 1991 she underwent left partial mastectomy and left axillary lymph node surgery by Dr. Francina Ames. She had postop radiation therapy. In 1994 she underwent right partial mastectomy Dr. Francina Ames with postop radiation therapy and was on tamoxifen. In 1999 she underwent a left partial mastectomy Dr. Francina Ames. At some point she took arimidex as well. She has not been on any specific treatment for 10 years or more.  Recent mammograms show a 1.6 cm area of microcalcifications deep within the upper outer quadrant of the right breast. Left breast mammogram was normal in November 2013. Image guided biopsy of the right breast showed high-grade DCIS and a focus of microinvasion, receptor positive, HER-2-negative. She has not had an MRI.  We discussed different options for therapy today. We discussed partial mastectomy, total mastectomy, use of antiestrogen therapy with or without surgery. She asked about doing nothing and we discussed the natural history of her cancer with progression and ulceration.. She knows if she cannot have any more radiation therapy. It seemed appropriate to everyone to do something about her cancer since she will probably live 5 or 10 years more.. At the end of the discussion, in her individual  case, we elected to proceed with right partial mastectomy. She states she will decide whether to take antiestrogen therapy or not later.   ADDENDUM: Review of old records from multiple breast surgeries (Dr. Paticia Stack notes) :  11/02/1989 - Right lumpectomy. 9mm IDC, ER / PR strongly positive.  11/16/1989 - right axillary node dissection. 0/17 nodes 1991 - radiation therapy, but no record of antiestrogen therapy  03/12/1992 - right breast biopsy for benign calcifications  07/21/1993 - right lumpectomy (for 1st recurrence on right) ICD; ER 90%, PR 30%, Ki67 11%, grade I Received tamoxifen 01/19/1994 - Left breast biopsy (at 12:30 position) Atypical ductal hyerplasia 03/19/1998 - left partial mastectomy with SLN biopsy Invasive lobular, 2.5 cm., close margin ER 98%, PR less than 10%; Her 2 positive SLN #1 negative SLN#2 rare cytokeratin positive cells/micro met Radiation therapy and 5 years arimidex(Dr. Sabino Gasser)  2014 - current case, second recurrence on right.   HPI  Past Medical History  Diagnosis Date  . Hypertension   . Urinary incontinence   . Depression   . Anxiety   . Blindness, [place] as defined in [place]     both eyes    Past Surgical History  Procedure Laterality Date  . Breast lumpectomy Bilateral     left 1991,right side 1994  . Abdominal hysterectomy    . Back surgery    . Eye surgery      History reviewed. No pertinent family history.  Social History History  Substance Use Topics  . Smoking status: Never Smoker   .  Smokeless tobacco: Not on file  . Alcohol Use: No    Allergies  Allergen Reactions  . Aspirin     Current Outpatient Prescriptions  Medication Sig Dispense Refill  . AmLODIPine Besylate (NORVASC PO) Take 1 tablet by mouth 2 (two) times daily.      . hydrALAZINE (APRESOLINE) 10 MG tablet Take 5 mg by mouth 2 (two) times daily.      . hydrALAZINE (APRESOLINE) 10 MG tablet Take 1 tablet (10 mg total) by mouth 3 (three) times daily.  60 tablet  0  .  LISINOPRIL-HYDROCHLOROTHIAZIDE PO Take 1 tablet by mouth daily.      Marland Kitchen POTASSIUM CHLORIDE ER PO Take 1 tablet by mouth daily.      . timolol (TIMOPTIC) 0.5 % ophthalmic solution Place 1 drop into both eyes 2 (two) times daily.       No current facility-administered medications for this visit.    Review of Systems Review of Systems  Constitutional: Negative for fever, chills and unexpected weight change.  HENT: Positive for hearing loss. Negative for congestion, sore throat, trouble swallowing and voice change.   Eyes: Positive for visual disturbance.  Respiratory: Negative for cough and wheezing.   Cardiovascular: Negative for chest pain, palpitations and leg swelling.  Gastrointestinal: Negative for nausea, vomiting, abdominal pain, diarrhea, constipation, blood in stool, abdominal distention and anal bleeding.  Genitourinary: Positive for urgency, frequency and difficulty urinating. Negative for hematuria and vaginal bleeding.  Musculoskeletal: Negative for arthralgias.  Skin: Negative for rash and wound.  Neurological: Negative for seizures, syncope and headaches.  Hematological: Negative for adenopathy. Does not bruise/bleed easily.  Psychiatric/Behavioral: Negative for confusion. The patient is nervous/anxious.     There were no vitals taken for this visit.  Physical Exam Physical Exam  Constitutional: She is oriented to person, place, and time. She appears well-developed and well-nourished. No distress.  HENT:  Head: Normocephalic and atraumatic.  Nose: Nose normal.  Mouth/Throat: No oropharyngeal exudate.  Eyes: Conjunctivae and EOM are normal. Pupils are equal, round, and reactive to light. Left eye exhibits no discharge. No scleral icterus.  Neck: Neck supple. No JVD present. No tracheal deviation present. No thyromegaly present.  Cardiovascular: Normal rate, regular rhythm and intact distal pulses.   Murmur heard. Soft systolic murmur  Pulmonary/Chest: Effort normal and  breath sounds normal. No respiratory distress. She has no wheezes. She has no rales. She exhibits no tenderness.    Bilateral axillary scars, and no axillary adenopathy. 2 lumpectomy incisions on the left and one on the right. Bruise from recent biopsy upper outer quadrant on the right. Nipple and areolar complexes is normal.  Abdominal: Soft. Bowel sounds are normal. She exhibits no distension and no mass. There is no tenderness. There is no rebound and no guarding.  Musculoskeletal: She exhibits no edema and no tenderness.  Lymphadenopathy:    She has no cervical adenopathy.  Neurological: She is alert and oriented to person, place, and time. She exhibits normal muscle tone. Coordination normal.  Skin: Skin is warm. No rash noted. She is not diaphoretic. No erythema. No pallor.  Psychiatric: She has a normal mood and affect. Her behavior is normal. Judgment and thought content normal.    Data Reviewed Imaging studies. Histology. The breast diagnostic profile. Treatment plan coordinated with Dr. Darnelle Catalan and Dr. Mitzi Hansen   Assessment    Invasive ductal carcinoma right breast, lateral, recurrent. Clinical stage T1c., N0.  History right partial mastectomy, axillary node surgery and radiation therapy in  1994. Upper inner quadrant  History two separate lumpectomies o left, one involving axillary node surgery and radiation therapy.  Hypertension  Legal blindness  Spastic bladder     Plan    I had a very long discussion with the patient and her sons. We discussed standard of care. We discussed mastectomy versus lumpectomy. We discussed doing nothing. We discussed antiestrogen therapy alone. Because of her advanced age we leaned away from mastectomy thinking that might be too morbid and without significant survival benefit. She opted for a right partial mastectomy with needle localization. She will decide whether she will take antiestrogen therapy later.   She will be scheduled for right  partial mastectomy with needle localization in the near future.  I discussed the indications, details, techniques, and numerous risks of the surgery with the patient her sons. She is aware of the risk of bleeding, infection, recurrent cancer, anesthesia complications, skin necrosis, and other unforseen problems. She understands all these issues and all of her questions are answered. She agrees with this plan.       [name]. Derrell Lolling, M.D., [facility] General and Minimally invasive Surgery Breast and Colorectal Surgery Office:   [phone] Pager:   [phone]  04/25/2013, 4:58 PM

## 2013-04-25 NOTE — Progress Notes (Signed)
ID: Alejandra Grant OB: 1923/07/24  MR#: [id number]  XLK#:[id number]  PCP: Alejandra Bradford, MD GYN:   SUClaud Grant OTHER MD: Alejandra Grant, Alejandra Grant, Alejandra Grant, Alejandra Grant   HISTORY OF PRESENT ILLNESS: [name] has a prior history of bilateral lumpectomies, bilateral rest irradiation, and prior treatment with tamoxifen (approximately 4 and half years) and an anastrozole (unknown duration). The data is being obtained from the archives.  More recently some new calcifications were noted in the right breast, and digital right breast mammography with magnification views confirmed a cluster in the posterior third of the breast spanning an area of 1.3 cm. Repeat within 6 months was suggested, and performed 04/02/2013. The patient's breasts are heterogeneously dense. The area of 1.3 cm of microcalcifications had increased in number, and spanned 1.6 cm and this study. Accordingly biopsy was obtained 04/18/2013, and this showed ([id number]) high-grade ductal carcinoma in situ, with a 2 mm area of invasive disease. The invasive tumor was estrogen receptor 100% positive, progesterone receptor negative, with an MIB-1 of 2% and no HER-2 amplification.  The patient's subsequent history is as detailed below  INTERVAL HISTORY: Alejandra was seen at the multidisciplinary breast cancer clinic 04/25/2013 accompanied by her sons Alejandra Grant and Alejandra Grant.  REVIEW OF SYSTEMS: The patient is legally blind. She wears a hearing aid. She has frequent urination and is being followed by urology for that. She has some difficulties walking. She feels anxious and depressed. Otherwise the patient and her family went over our extensive review of systems in noted no recent changes.   PAST MEDICAL HISTORY: Past Medical History  Diagnosis Date  . Hypertension   . Urinary incontinence   . Depression   . Anxiety   . Blindness, [place] defined in [place]     both eyes    PAST SURGICAL HISTORY: Past Surgical History   Procedure Laterality Date  . Breast lumpectomy Bilateral     left 1991,right side 1994  . Abdominal hysterectomy    . Back surgery    . Eye surgery      FAMILY HISTORY No family history on file. The patient's mother died giving birth to the patient, at age 48. The patient did not grow up with her father and has very little information regarding him or his side of the family. She believes he died in his 61s in a state hospital. The patient had no brothers or sisters.  GYNECOLOGIC HISTORY:  The patient does not recall how old she was at menarche. She thinks she went through menopause in her 41s. She is GX P2 with first live birth at age 5. The patient is status post hysterectomy but does not know if her ovaries were removed or not.  SOCIAL HISTORY:  The patient worked as a Nutritional therapist. She is a widow and lives by herself. She uses the [facility] and Center and other support services to get around, do her shopping, etc. Son Alejandra Grant lives in [place] and works in Audiological scientist estate. Son Alejandra Grant lives in [place] and is in Sports coach. The patient has 3 grandchildren. She attends the first [employer] locally.    ADVANCED DIRECTIVES: In place   HEALTH MAINTENANCE: History  Substance Use Topics  . Smoking status: Never Smoker   . Smokeless tobacco: Not on file  . Alcohol Use: No     Colonoscopy:  PAP:  Bone density: October 2008, showing osteoporosis (left hip T score -2.6)  Lipid panel:  Allergies  Allergen Reactions  . Aspirin     Current Outpatient Prescriptions  Medication Sig Dispense Refill  . AmLODIPine Besylate (NORVASC PO) Take 1 tablet by mouth 2 (two) times daily.      . hydrALAZINE (APRESOLINE) 10 MG tablet Take 5 mg by mouth 2 (two) times daily.      . hydrALAZINE (APRESOLINE) 10 MG tablet Take 1 tablet (10 mg total) by mouth 3 (three) times daily.  60 tablet  0  . LISINOPRIL-HYDROCHLOROTHIAZIDE PO Take 1 tablet by mouth daily.       Marland Kitchen POTASSIUM CHLORIDE ER PO Take 1 tablet by mouth daily.      . timolol (TIMOPTIC) 0.5 % ophthalmic solution Place 1 drop into both eyes 2 (two) times daily.       No current facility-administered medications for this visit.    OBJECTIVE: Elderly white woman in no acute distress Filed Vitals:   04/25/13 1310  BP: 163/70  Pulse: 67  Temp: 98.9 F (37.2 C)  Resp: 20     Body mass index is 20.12 kg/(m^2).    ECOG FS: 2  Sclerae unicteric No obvious cervical or supraclavicular adenopathy Lungs good excursion bilaterally Abd not distended MSK no peripheral edema Neuro: non-focal, pleasant affect Breasts: Status post bilateral lumpectomies and recent right biopsy  LAB RESULTS:  CMP     Component Value Date/Time   NA 139 04/25/2013 1250   NA 137 11/26/2011 1220   K 3.8 04/25/2013 1250   K 4.4 11/26/2011 1220   CL 102 04/25/2013 1250   CL 97 11/26/2011 1220   CO2 29 04/25/2013 1250   CO2 28 11/26/2011 1220   GLUCOSE 120* 04/25/2013 1250   GLUCOSE 92 11/26/2011 1220   BUN 19.5 04/25/2013 1250   BUN 18 11/26/2011 1220   CREATININE 0.8 04/25/2013 1250   CREATININE 0.70 11/26/2011 1220   CALCIUM 10.8* 04/25/2013 1250   CALCIUM 10.9* 11/26/2011 1220   PROT 6.7 04/25/2013 1250   PROT 6.5 07/04/2011 1324   ALBUMIN 3.7 04/25/2013 1250   ALBUMIN 3.7 07/04/2011 1324   AST 43* 04/25/2013 1250   AST 28 07/04/2011 1324   ALT 33 04/25/2013 1250   ALT 18 07/04/2011 1324   ALKPHOS 50 04/25/2013 1250   ALKPHOS 48 07/04/2011 1324   BILITOT 0.30 04/25/2013 1250   BILITOT 0.3 07/04/2011 1324   GFRNONAA 76* 11/26/2011 1220   GFRAA 88* 11/26/2011 1220    I No results found for this basename: SPEP, UPEP,  kappa and lambda light chains    Lab Results  Component Value Date   WBC 6.8 04/25/2013   NEUTROABS 3.9 04/25/2013   HGB 12.6 04/25/2013   HCT 37.0 04/25/2013   MCV 90.9 04/25/2013   PLT 241 04/25/2013      Chemistry      Component Value Date/Time   NA 139 04/25/2013 1250   NA 137 11/26/2011 1220   K 3.8  04/25/2013 1250   K 4.4 11/26/2011 1220   CL 102 04/25/2013 1250   CL 97 11/26/2011 1220   CO2 29 04/25/2013 1250   CO2 28 11/26/2011 1220   BUN 19.5 04/25/2013 1250   BUN 18 11/26/2011 1220   CREATININE 0.8 04/25/2013 1250   CREATININE 0.70 11/26/2011 1220      Component Value Date/Time   CALCIUM 10.8* 04/25/2013 1250   CALCIUM 10.9* 11/26/2011 1220   ALKPHOS 50 04/25/2013 1250   ALKPHOS 48 07/04/2011 1324   AST 43* 04/25/2013 1250   AST  28 07/04/2011 1324   ALT 33 04/25/2013 1250   ALT 18 07/04/2011 1324   BILITOT 0.30 04/25/2013 1250   BILITOT 0.3 07/04/2011 1324       No results found for this basename: LABCA2    No components found with this basename: LABCA125    No results found for this basename: INR,  in the last 168 hours  Urinalysis    Component Value Date/Time   COLORURINE YELLOW 11/26/2011 1210   APPEARANCEUR CLEAR 11/26/2011 1210   LABSPEC 1.012 11/26/2011 1210   PHURINE 7.5 11/26/2011 1210   GLUCOSEU NEGATIVE 11/26/2011 1210   HGBUR NEGATIVE 11/26/2011 1210   BILIRUBINUR NEGATIVE 11/26/2011 1210   KETONESUR NEGATIVE 11/26/2011 1210   PROTEINUR NEGATIVE 11/26/2011 1210   UROBILINOGEN 0.2 11/26/2011 1210   NITRITE NEGATIVE 11/26/2011 1210   LEUKOCYTESUR NEGATIVE 11/26/2011 1210    STUDIES: Mm Digital Diagnostic Unilat R  04/02/2013   *RADIOLOGY REPORT*  Clinical Data:  Follow-up right breast calcifications. Status post right lumpectomy for breast cancer in 1994 and left lumpectomy and radiation therapy for breast cancer in 1991.  DIGITAL DIAGNOSTIC RIGHT MAMMOGRAM WITH CAD  Comparison: Previous examinations, the most recent dated 09/27/2012 and 09/08/2012.  Findings:  ACR Breast Density Category 3: The breast tissue is heterogeneously dense.  The previously demonstrate 1.3 cm area of microcalcifications deep in the upper outer right breast have increased in number and area of distribution with more linear forms and more linearly arranged calcifications.  These currently span an area  measuring 1.6 cm in maximum diameter.  These previously spanned an area measuring 1.3 cm in maximum diameter on 09/27/2012.  No findings elsewhere in the right breast suspicious for malignancy.  Mammographic images were processed with CAD.  IMPRESSION: Mild increase in number and area of distribution of suspicious microcalcifications in the deep upper outer right breast.  These also have increased in linearity.  These are suspicious for ductal carcinoma in situ.  Biopsy is recommended.  The options of stereotactic guided core needle biopsy and surgical excisional biopsy were discussed with the patient.  She will discuss with her family these options and will call the [facility] or let her physician know if she decides to have the stereotactic guided core needle biopsy.  RECOMMENDATION: Right breast stereotactic guided core needle biopsy.  I have discussed the findings and recommendations with the patient. Results were also provided in writing at the conclusion of the visit.  If applicable, a reminder letter will be sent to the patient regarding her next appointment.  BI-RADS CATEGORY 4:  Suspicious abnormality - biopsy should be considered.   Original Report Authenticated By: Beckie Salts, M.D.   Mm Rt Breast Bx W Loc Dev 1st Lesion Image Bx Spec Stereo Guide  04/19/2013   **ADDENDUM** CREATED: 04/19/2013 10:53:19  Pathologic results have become available and indicate the presence of extensive high grade DCIS as well as, potentially, microinvasion.  Further evaluation of the tissue is being conducted to assess the possibility of invasion.  These results are concordant with the imaging findings.  I discussed these results with the patient by phone on 04/19/13 at 11am.  She indicated tenderness at the biopsy site but no other problems.  As we discussed, she is scheduled with the Multidisciplinary Clinic on 04/25/13.  **END ADDENDUM** SIGNED BY: Esperanza Heir, M.D.  04/18/2013   *RADIOLOGY  REPORT*  Clinical Data:  Right breast calcifications  STEREOTACTIC-GUIDED VACUUM ASSISTED BIOPSY OF THE RIGHT BREAST AND  SPECIMEN RADIOGRAPH  Comparison: Previous exams.  I met with the patient and we discussed the procedure of stereotactic-guided biopsy, including benefits and alternatives. We discussed the high likelihood of a successful procedure. We discussed the risks of the procedure, including infection, bleeding, tissue injury, clip migration, and inadequate sampling. Informed, written consent was given. The usual time-out protocol was performed immediately prior to the procedure.  Using sterile technique and 2% Lidocaine as local anesthetic, under stereotactic guidance, a 9 gauge vacuum-assisted device was used to perform core needle biopsy of the calcifications located laterally within the right breast using a superior craniocaudal approach. Specimen radiograph was performed, showing multiple representative calcifications within the specimen.  Specimens with calcifications are identified for pathology.  At the conclusion of the procedure, an hourglass shaped tissue marker clip was deployed into the biopsy cavity.  Follow-up 2-view mammogram confirmed clip to be in appropriate position.  IMPRESSION: Stereotactic-guided biopsy of right breast calcifications as discussed.  No apparent complications.   Original Report Authenticated By: Rolla Plate, M.D.    ASSESSMENT: 79 y.o. [place] woman  (1) status post remote lumpectomies to both breasts, with adjuvant radiation bilaterally, and prior history of tamoxifen and Arimidex (records to be reviewed)  (2) Status post right breast biopsy 04/18/2013 for ductal carcinoma in situ, high-grade, with an area of microinvasion; the invasive cancer was estrogen receptor positive at 100%, progesterone receptor and HER-2 negative, with an MIB-1 of 2%.  (3) genetics testing discussed  PLAN: We spent the better part of today's 45-minute visit discussing the  patient's situation at length. We do not have all the details of her past treatments available, but clearly if she has had radiation to both breasts in the past then further radiation is not an option.  The patient and her 2 sons were interested in the possibility of no treatment, but I would be concerned regarding the eventual development of uncontrolled local spread, and they understand I strongly recommend surgery.  The issue of antiestrogen therapy is more complicated. Tamoxifen and aromatase inhibitors are inexpensive and generally well-tolerated but they can have significant side effects, even if rare. Accordingly no decision was made regarding proceeding with anti-estrogen therapy after surgery. Instead we will review the margins and the size of the invasive component after surgery. We will make a definitive decision when the patient returns to see me in late July.  The patient has had bilateral breast cancer as well as multiple breast cancers and she has a noninformative family (her mother died at age 65, the patient had no siblings, and she knows nothing about her father or his family). I think she likely qualify for genetic testing. The patient's sons are considering this and we will discuss it further at the next visit.  Lowella Dell, MD   04/25/2013 3:45 PM

## 2013-04-25 NOTE — Patient Instructions (Signed)
You  have developed a new cancer in the lateral aspect of your right breast. This appears to be a localized finding.  You have had 3 prior breast cancers and have had lymph node surgery on both sides. You have also had radiation therapy on both sides.  We have discussed different options. We have decided to proceed with a right partial mastectomy with needle localization for local control of this disease. You will decide whether to take antiestrogen therapy later.    Lumpectomy, Breast Conserving Surgery A lumpectomy is breast surgery that removes only part of the breast. Another name used may be partial mastectomy. The amount removed varies. Make sure you understand how much of your breast will be removed. Reasons for a lumpectomy:  Any solid breast mass.  Grouped significant nodularity that may be confused with a solitary breast mass. Lumpectomy is the most common form of breast cancer surgery today. The surgeon removes the portion of your breast which contains the tumor (cancer). This is the lump. Some normal tissue around the lump is also removed to be sure that all the tumor has been removed.  If cancer cells are found in the margins where the breast tissue was removed, your surgeon will do more surgery to remove the remaining cancer tissue. This is called re-excision surgery. Radiation and/or chemotherapy treatments are often given following a lumpectomy to kill any cancer cells that could possibly remain.  REASONS YOU MAY NOT BE ABLE TO HAVE BREAST CONSERVING SURGERY:  The tumor is located in more than one place.  Your breast is small and the tumor is large so the breast would be disfigured.  The entire tumor removal is not successful with a lumpectomy.  You cannot commit to a full course of chemotherapy, radiation therapy or are pregnant and cannot have radiation.  You have previously had radiation to the breast to treat cancer. HOW A LUMPECTOMY IS PERFORMED If overnight nursing  is not required following a biopsy, a lumpectomy can be performed as a same-day surgery. This can be done in a hospital, clinic, or surgical center. The anesthesia used will depend on your surgeon. They will discuss this with you. A general anesthetic keeps you sleeping through the procedure. LET YOUR CAREGIVERS KNOW ABOUT THE FOLLOWING:  Allergies  Medications taken including herbs, eye drops, over the counter medications, and creams.  Use of steroids (by mouth or creams)  Previous problems with anesthetics or Novocaine.  Possibility of pregnancy, if this applies  History of blood clots (thrombophlebitis)  History of bleeding or blood problems.  Previous surgery  Other health problems BEFORE THE PROCEDURE You should be present one hour prior to your procedure unless directed otherwise.  AFTER THE PROCEDURE  After surgery, you will be taken to the recovery area where a nurse will watch and check your progress. Once you're awake, stable, and taking fluids well, barring other problems you will be allowed to go home.  Ice packs applied to your operative site may help with discomfort and keep the swelling down.  A small rubber drain may be placed in the breast for a couple of days to prevent a hematoma from developing in the breast.  A pressure dressing may be applied for 24 to 48 hours to prevent bleeding.  Keep the wound dry.  You may resume a normal diet and activities as directed. Avoid strenuous activities affecting the arm on the side of the biopsy site such as tennis, swimming, heavy lifting (more than 10 pounds)  or pulling.  Bruising in the breast is normal following this procedure.  Wearing a bra - even to bed - may be more comfortable and also help keep the dressing on.  Change dressings as directed.  Only take over-the-counter or prescription medicines for pain, discomfort, or fever as directed by your caregiver. Call for your results as instructed by your surgeon.  Remember it is your responsibility to get the results of your lumpectomy if your surgeon asked you to follow-up. Do not assume everything is fine if you have not heard from your caregiver. SEEK MEDICAL CARE IF:   There is increased bleeding (more than a small spot) from the wound.  You notice redness, swelling, or increasing pain in the wound.  Pus is coming from wound.  An unexplained oral temperature above 102 F (38.9 C) develops.  You notice a foul smell coming from the wound or dressing. SEEK IMMEDIATE MEDICAL CARE IF:   You develop a rash.  You have difficulty breathing.  You have any allergic problems. Document Released: 11/29/2006 Document Revised: 01/10/2012 Document Reviewed: 03/02/2007 [facility] Patient Information 2014 [place], [place].

## 2013-04-27 NOTE — Addendum Note (Signed)
Addended by: Billey Co on: 04/27/2013 04:57 PM   Modules accepted: Orders, Medications

## 2013-04-30 ENCOUNTER — Other Ambulatory Visit: Payer: Self-pay | Admitting: Oncology

## 2013-04-30 NOTE — Progress Notes (Signed)
  Additional infiormation on Alejandra Grant:   11/02/1989 - Right lumpectomy. 9mm IDC, ER / PR strongly positive. 11/16/1989 - right axillary node dissection. 0/17 nodes 1991 - radiation therapy, but no record of antiestrogen therapy 03/12/1992 - right breast biopsy for benign calcifications 07/21/1993 - right lumpectomy (for 1st recurrence on right) ICD; ER 90%, PR 30%, Ki67 11%, grade I Received tamoxifen 01/19/1994 - Left breast biopsy (at 12:30 position) Atypical ductal hyerplasia 03/19/1998 - left partial mastectomy with SLN biopsy Invasive lobular, 2.5 cm., close margin ER 98%, PR less than 10%; Her 2 positive SLN #1 negative SLN#2 rare cytokeratin positive cells/micro met Radiation therapy and 5 years arimidex(Dr. Sabino Gasser)

## 2013-05-01 ENCOUNTER — Telehealth: Payer: Self-pay | Admitting: *Deleted

## 2013-05-01 NOTE — Telephone Encounter (Signed)
Called and spoke with patient from [facility] 04/25/13.  No questions or concerns at this time.  Contact information given.

## 2013-05-01 NOTE — Progress Notes (Signed)
Brief note  The patient was seen briefly today in multidisciplinary breast clinic. She has had a history of 3 prior breast cancers. In the past this has included a lumpectomy on each side followed by adjuvant radiotherapy.  The patient is 79 years old but does appear to have a reasonable life expectancy remaining at this time. The consensus was to treat the patient with a lumpectomy with consideration of antiestrogen therapy. Given her history, radiotherapy is not recommended as part of her initial treatment plan.  I discussed this briefly with the patient and her family. Contact information was given to them. I therefore do not anticipate the patient needing radiotherapy but I would be more than happy to see the patient at any point in the future if I can be of assistance for her care.  ------------------------------------------------  Radene Gunning, MD, PhD

## 2013-05-02 ENCOUNTER — Telehealth (INDEPENDENT_AMBULATORY_CARE_PROVIDER_SITE_OTHER): Payer: Self-pay | Admitting: General Surgery

## 2013-05-02 ENCOUNTER — Telehealth (INDEPENDENT_AMBULATORY_CARE_PROVIDER_SITE_OTHER): Payer: Self-pay | Admitting: *Deleted

## 2013-05-02 ENCOUNTER — Other Ambulatory Visit (INDEPENDENT_AMBULATORY_CARE_PROVIDER_SITE_OTHER): Payer: Self-pay | Admitting: *Deleted

## 2013-05-02 DIAGNOSIS — R0789 Other chest pain: Secondary | ICD-10-CM

## 2013-05-02 DIAGNOSIS — C50919 Malignant neoplasm of unspecified site of unspecified female breast: Secondary | ICD-10-CM

## 2013-05-02 NOTE — Telephone Encounter (Signed)
Patient and son Tawanna Cooler are both aware of test 05/07/13 at  [address]  12:45 and NMwhole body bone scan at [facility] 05/15/13 at 11:45 for 12 injection and 3pm return

## 2013-05-02 NOTE — Telephone Encounter (Signed)
Is it the NM whole body bone scan that you are wanting?

## 2013-05-02 NOTE — Telephone Encounter (Signed)
Schedule for:  1)  bone scan 2)  CT chest  Clinical history:  "Recurrent breast cancer. New right chest wall pain"   hmi

## 2013-05-02 NOTE — Telephone Encounter (Signed)
Patient called to report that she has been having pain in her right breast into the rib cage area for a few weeks now.  Patient states she has mentioned this to Dr. Derrell Lolling over at the hospital and he said to let him know if it continues.  Patient states she has the pain all day long however at night when she is tossing and turning it becomes worse.  Patient concerned about what this pain could mean.

## 2013-05-07 ENCOUNTER — Ambulatory Visit
Admission: RE | Admit: 2013-05-07 | Discharge: 2013-05-07 | Disposition: A | Discharge status: Home / Self Care | Payer: Medicare Other | Source: Ambulatory Visit | Attending: General Surgery | Admitting: General Surgery

## 2013-05-07 ENCOUNTER — Encounter (HOSPITAL_COMMUNITY): Payer: Self-pay | Admitting: Pharmacy Technician

## 2013-05-07 DIAGNOSIS — S2249XA Multiple fractures of ribs, unspecified side, initial encounter for closed fracture: Secondary | ICD-10-CM | POA: Diagnosis not present

## 2013-05-07 DIAGNOSIS — R0789 Other chest pain: Secondary | ICD-10-CM

## 2013-05-07 MED ORDER — IOHEXOL 300 MG/ML  SOLN
75.0000 mL | Freq: Once | INTRAMUSCULAR | Status: AC | PRN
  Administered 2013-05-07: 75 mL via INTRAVENOUS

## 2013-05-08 ENCOUNTER — Telehealth (INDEPENDENT_AMBULATORY_CARE_PROVIDER_SITE_OTHER): Payer: Self-pay | Admitting: General Surgery

## 2013-05-08 NOTE — Telephone Encounter (Signed)
CT scan of the chest was performed because of new right anterior chest wall pain. This showed subacute fractures of the right seventh eighth and ninth ribs at the costochondral junction and with signs of early healing. There was no evidence of bony destruction. . There was also noted 10 cm slightly irregular nodule within the superior segment of the right lower lobe, felt to be indeterminate. Given the history of recurrent breast cancer the possibility of metastasis should be considered.  Her bone scan is pending  I called [name] and discussed these findings with her. I told her to go ahead and get the bone scan he also told her that we should proceed with the right breast surgery to remove the cancer from her right breast. I told her that Dr. Darnelle Catalan would discuss the right lung mass with her at some point, but that this would not necessarily change the breast surgery. She expressed understanding and appreciation for the information.   Alejandra Grant. Derrell Lolling, M.D., [facility] General and Minimally invasive Surgery Breast and Colorectal Surgery Office:   [phone] Pager:   [phone]

## 2013-05-10 ENCOUNTER — Other Ambulatory Visit: Payer: Self-pay | Admitting: Oncology

## 2013-05-11 ENCOUNTER — Encounter (HOSPITAL_COMMUNITY): Payer: Self-pay

## 2013-05-11 ENCOUNTER — Encounter (HOSPITAL_COMMUNITY)
Admission: RE | Admit: 2013-05-11 | Discharge: 2013-05-11 | Disposition: A | Discharge status: Home / Self Care | Payer: Medicare Other | Source: Ambulatory Visit | Attending: General Surgery | Admitting: General Surgery

## 2013-05-11 ENCOUNTER — Encounter: Payer: Self-pay | Admitting: *Deleted

## 2013-05-11 DIAGNOSIS — Z01818 Encounter for other preprocedural examination: Secondary | ICD-10-CM | POA: Diagnosis not present

## 2013-05-11 DIAGNOSIS — R071 Chest pain on breathing: Secondary | ICD-10-CM | POA: Diagnosis not present

## 2013-05-11 DIAGNOSIS — C50919 Malignant neoplasm of unspecified site of unspecified female breast: Secondary | ICD-10-CM | POA: Diagnosis not present

## 2013-05-11 HISTORY — DX: Malignant (primary) neoplasm, unspecified: C80.1

## 2013-05-11 HISTORY — DX: Cardiac murmur, unspecified: R01.1

## 2013-05-11 HISTORY — DX: Anemia, unspecified: D64.9

## 2013-05-11 LAB — COMPREHENSIVE METABOLIC PANEL
AST: 26 U/L (ref 0–37)
Albumin: 3.9 g/dL (ref 3.5–5.2)
Alkaline Phosphatase: 57 U/L (ref 39–117)
CO2: 29 mEq/L (ref 19–32)
Chloride: 99 mEq/L (ref 96–112)
Creatinine, Ser: 0.75 mg/dL (ref 0.50–1.10)
GFR calc non Af Amer: 73 mL/min — ABNORMAL LOW (ref >90)
Potassium: 3.6 mEq/L (ref 3.5–5.1)
Total Bilirubin: 0.3 mg/dL (ref 0.3–1.2)

## 2013-05-11 LAB — URINALYSIS, ROUTINE W REFLEX MICROSCOPIC
Bilirubin Urine: NEGATIVE
Glucose, UA: NEGATIVE mg/dL
Hgb urine dipstick: NEGATIVE
Ketones, ur: NEGATIVE mg/dL
Protein, ur: NEGATIVE mg/dL
Urobilinogen, UA: 1 mg/dL (ref 0.0–1.0)

## 2013-05-11 LAB — CBC WITH DIFFERENTIAL/PLATELET
Basophils Absolute: 0 10*3/uL (ref 0.0–0.1)
Basophils Relative: 1 % (ref 0–1)
HCT: 37.5 % (ref 36.0–46.0)
Hemoglobin: 12.8 g/dL (ref 12.0–15.0)
Lymphocytes Relative: 37 % (ref 12–46)
MCHC: 34.1 g/dL (ref 30.0–36.0)
Monocytes Absolute: 0.9 10*3/uL (ref 0.1–1.0)
Monocytes Relative: 12 % (ref 3–12)
Neutro Abs: 3.3 10*3/uL (ref 1.7–7.7)
Neutrophils Relative %: 47 % (ref 43–77)
RDW: 16 % — ABNORMAL HIGH (ref 11.5–15.5)
WBC: 7 10*3/uL (ref 4.0–10.5)

## 2013-05-11 NOTE — Pre-Procedure Instructions (Addendum)
DEZRA MANDELLA  05/11/2013   Your procedure is scheduled on: 7.21.14  Report to [facility] at after breast center [phone] AM.  Call this number if you have problems the morning of surgery: [phone]   Remember:   Do not eat food or drink liquids after midnight.   Take these medicines the morning of surgery with A SIP OF WATER: prozac,hydralazine, eye drops   Do not wear jewelry, make-up or nail polish.  Do not wear lotions, powders, or perfumes. You may wear deodorant.  Do not shave 48 hours prior to surgery. Men may shave face and neck.  Do not bring valuables to the hospital.  [facility] is not responsible                   for any belongings or valuables.  Contacts, dentures or bridgework may not be worn into surgery.  Leave suitcase in the car. After surgery it may be brought to your room.  For patients admitted to the hospital, checkout time is 11:00 AM the day of  discharge.   Patients discharged the day of surgery will not be allowed to drive  home.  Name and phone number of your driver:  Special Instructions: Shower using CHG 2 nights before surgery and the night before surgery.  If you shower the day of surgery use CHG.  Use special wash - you have one bottle of CHG for all showers.  You should use approximately 1/3 of the bottle for each shower.   Please read over the following fact sheets that you were given: Pain Booklet, Coughing and Deep Breathing and Surgical Site Infection Prevention

## 2013-05-12 ENCOUNTER — Encounter: Payer: Self-pay | Admitting: *Deleted

## 2013-05-12 NOTE — Progress Notes (Signed)
Mailed after appt letter to pt. 

## 2013-05-14 ENCOUNTER — Telehealth: Payer: Self-pay | Admitting: Oncology

## 2013-05-14 NOTE — Progress Notes (Signed)
Anesthesia chart review: Patient is a 79 year old female scheduled for right partial mastectomy with needle localization for recurrent right breast cancer on 05/21/2013 by Dr. Derrell Lolling. History includes nonsmoker, hypertension, anxiety, depression, legally blind (due to macular degeneration), anemia, breast cancer status post left lumpectomy 1991 with left partial mastectomy 1999 and right lumpectomy 1994, skin cancer, urinary incontinence, murmur (not specified), tonsillectomy, hysterectomy, cataract extraction, back surgery. PCP is Dr. Laurann Montana.  She was evaluated by cardiologist Dr. Donato Schultz in 01/2011 for first degree AVB with 1.7 second pause on her Holter monitor. He did not think that there was any indication for a PPM at that time.  His note from 02/24/11 mentions PRN cardiology follow-up with continued HTN follow-up with her PCP.  (BP was 159/66, HR 59 bpm at PAT.)  EKG on 05/11/13 showed SB @ 57 bpm, first degree AVB, PACs, septal infarct (age undetermined).  The interpreting cardiologist did not feel that it was significantly changed when compared to the prior tracing in September 2012.    Echo on 01/28/11 showed: Normal LV chamber size the focal basal septal hypertrophy, moderate to severe.  (There is no outflow gradient of SAM), inferoapical thinning and hypokinesis of the LV, LVEF 65-70%. Focal calcification of the non-coronary cusp, very small mobile calcified echodensity on leaflet tip of AV, possible etiologies include Lambl's excresence, old healed vegetation, or degeneration of leaflet tips.  (Dr. Anne Fu planned to monitor clinically.).  Mild aortic root dilatation.  40mm at the sinus of Valsalva.  Doppler findings suggestive of grade 1 diastolic dysfunction without elevated LA pressure. Mild pulmonic regurgitation.    Chest x-ray on 05/11/2013 showed hyperaeration suggestive of emphysema. Stable mild cardiomegaly.  Preoperative labs noted.  Patient has had cardiology evaluation in  2012 with continued PCP follow-up recommended.  Her BP is elevated, but reasonable for surgery.  I reviewed cardiology records with anesthesiologist Dr. Chaney Malling.  Patient will be evaluated by her assigned anesthesiologist on the day of surgery, but if no acute CV symptomology then anticipate that she could proceed as planned.  Velna Ochs [facility]/Anesthesiology Phone [phone] 05/14/2013 5:36 PM

## 2013-05-14 NOTE — Telephone Encounter (Signed)
Per 7/11 pof scheduled pt w/GM 8/6 in the PM. Called pt re appt was not able to reach her - line continues to ring busy. August schedule mailed.

## 2013-05-15 ENCOUNTER — Other Ambulatory Visit: Payer: Self-pay | Admitting: Oncology

## 2013-05-15 ENCOUNTER — Encounter: Payer: Self-pay | Admitting: *Deleted

## 2013-05-15 ENCOUNTER — Encounter (HOSPITAL_COMMUNITY)
Admission: RE | Admit: 2013-05-15 | Discharge: 2013-05-15 | Disposition: A | Discharge status: Home / Self Care | Payer: Medicare Other | Source: Ambulatory Visit | Attending: General Surgery | Admitting: General Surgery

## 2013-05-15 DIAGNOSIS — C50919 Malignant neoplasm of unspecified site of unspecified female breast: Secondary | ICD-10-CM | POA: Insufficient documentation

## 2013-05-15 DIAGNOSIS — R071 Chest pain on breathing: Secondary | ICD-10-CM | POA: Insufficient documentation

## 2013-05-15 DIAGNOSIS — R0789 Other chest pain: Secondary | ICD-10-CM

## 2013-05-15 DIAGNOSIS — R079 Chest pain, unspecified: Secondary | ICD-10-CM | POA: Diagnosis not present

## 2013-05-15 MED ORDER — TECHNETIUM TC 99M MEDRONATE IV KIT
25.0000 | PACK | Freq: Once | INTRAVENOUS | Status: AC | PRN
Start: 2013-05-15 — End: 2013-05-15
  Administered 2013-05-15: 25 via INTRAVENOUS

## 2013-05-15 NOTE — Progress Notes (Signed)
Called son Tawanna Cooler with the results of the bone scan and CT scan. I don't think the 1 cm lesion worsening in the lung has anything to do with her current breast cancer. It only needs to be followed. I suggested we do a chest x-ray in 3 months and a repeat CT of the chest and 6.

## 2013-05-15 NOTE — Progress Notes (Signed)
Mailed after appt letter to pt. 

## 2013-05-16 ENCOUNTER — Telehealth (INDEPENDENT_AMBULATORY_CARE_PROVIDER_SITE_OTHER): Payer: Self-pay

## 2013-05-16 NOTE — Telephone Encounter (Signed)
I called the pt and notified her of the bone scan results below. Surgery is Monday.

## 2013-05-16 NOTE — Telephone Encounter (Signed)
Message copied by Ivory Broad on Wed May 16, 2013 10:23 AM ------      Message from: [name]      Created: Tue May 15, 2013  5:36 PM       Call radiology reports to patient.Good news. No evidence of cancer spread to bones. Just old rib fractures.      hmi ------

## 2013-05-20 MED ORDER — CEFAZOLIN SODIUM-DEXTROSE 2-3 GM-% IV SOLR
2.0000 g | INTRAVENOUS | Status: AC
  Administered 2013-05-21: 2 g via INTRAVENOUS
  Filled 2013-05-20: qty 50

## 2013-05-20 NOTE — H&P (Signed)
Alejandra Grant    MRN:  [id number]   Description: 79 year old female  Provider: [name], MD  Department: [facility]      Diagnoses    Breast cancer of upper-outer quadrant of right female breast    -  Primary    174.4   History and Physical    [name], MD     Status: Addendum                         HPI Alejandra Grant is a 79 y.o. female.  She is referred by Dr. Anselmo Pickler at the [facility] for evaluation of another invasive cancer of the right breast laterally. Her primary care physician is Dr. Laurann Montana. She was evaluated  in the [facility] on 04/25/13  by Dr. Darnelle Catalan, Dr. Mitzi Hansen, and me.   This patient is a very elderly and came  with her 2 sons. She is legally blind but is otherwise independent and ambulatory. Under treatment for hypertension. Has had a lumbar laminectomy and a fractured elbow and is followed with a spastic bladder.   She has had 3 prior breast cancers. In 1991 she underwent left partial mastectomy and left axillary lymph node surgery by Dr. Francina Ames. She had postop radiation therapy. In 1994 she underwent right partial mastectomy Dr. Francina Ames with postop radiation therapy and was on tamoxifen. In 1999 she underwent a left partial mastectomy Dr. Francina Ames. At some point she took arimidex as well. She has not been on any specific treatment for 10 years or more.(see further details below)   Recent mammograms show a 1.6 cm area of microcalcifications deep within the upper outer quadrant of the right breast. Left breast mammogram was normal in November 2013. Image guided biopsy of the right breast showed high-grade DCIS and a focus of microinvasion, receptor positive, HER-2-negative. She has not had an MRI. She mentions some right rib pain.   We discussed different options for therapy today. We discussed partial mastectomy, total mastectomy, use of antiestrogen therapy with or without surgery. She asked about doing nothing  and we discussed the natural history of her cancer with progression and ulceration.. She knows if she cannot have any more radiation therapy. It seemed appropriate to everyone to do something about her cancer since she will probably live 5 or 10 years more.. At the end of the discussion, in her individual case, we elected to proceed with right partial mastectomy. She states she will decide whether to take antiestrogen therapy or not later.    ADDENDUM: Review of old records from multiple breast surgeries (Dr. Paticia Stack notes) :  11/02/1989 - Right lumpectomy. 9mm IDC, ER / PR strongly positive.  11/16/1989 - right axillary node dissection. 0/17 nodes 1991 - radiation therapy, but no record of antiestrogen therapy  03/12/1992 - right breast biopsy for benign calcifications  07/21/1993 - right lumpectomy (for 1st recurrence on right) ICD; ER 90%, PR 30%, Ki67 11%, grade I Received tamoxifen 01/19/1994 - Left breast biopsy (at 12:30 position) Atypical ductal hyerplasia 03/19/1998 - left partial mastectomy with SLN biopsy Invasive lobular, 2.5 cm., close margin ER 98%, PR less than 10%; Her 2 positive SLN #1 negative SLN#2 rare cytokeratin positive cells/micro met Radiation therapy and 5 years arimidex(Dr. Sabino Gasser)   2014 - current case, second recurrence on right.  ADDENDUM: CT Chest 05/07/2013 shows right rib fractures 7,8,9 at site of right chest  wall pain with slight healing. Also 10 mm nodule superior segment of RLL.  Bone scan 05/16/2013 consistent with healing right rib fractures; no evidence for bony mets.  Patient aware.  Discussed right lung nodule with Dr. Darnelle Catalan who recommends followup imaging at later date and no intervention at present. I agree.        Past Medical History   Diagnosis  Date   .  Hypertension     .  Urinary incontinence     .  Depression     .  Anxiety     .  Blindness, [place] defined in [place]         both eyes       Past Surgical History   Procedure   Laterality  Date   .  Breast lumpectomy  Bilateral         left 1991,right side 1994   .  Abdominal hysterectomy       .  Back surgery       .  Eye surgery          History reviewed. No pertinent family history.   Social History History   Substance Use Topics   .  Smoking status:  Never Smoker    .  Smokeless tobacco:  Not on file   .  Alcohol Use:  No       Allergies   Allergen  Reactions   .  Aspirin         Current Outpatient Prescriptions   Medication  Sig  Dispense  Refill   .  AmLODIPine Besylate (NORVASC PO)  Take 1 tablet by mouth 2 (two) times daily.         .  hydrALAZINE (APRESOLINE) 10 MG tablet  Take 5 mg by mouth 2 (two) times daily.         .  hydrALAZINE (APRESOLINE) 10 MG tablet  Take 1 tablet (10 mg total) by mouth 3 (three) times daily.   60 tablet   0   .  LISINOPRIL-HYDROCHLOROTHIAZIDE PO  Take 1 tablet by mouth daily.         Marland Kitchen  POTASSIUM CHLORIDE ER PO  Take 1 tablet by mouth daily.         .  timolol (TIMOPTIC) 0.5 % ophthalmic solution  Place 1 drop into both eyes 2 (two) times daily.              Review of Systems  Constitutional: Negative for fever, chills and unexpected weight change.  HENT: Positive for hearing loss. Negative for congestion, sore throat, trouble swallowing and voice change.   Eyes: Positive for visual disturbance.  Respiratory: Negative for cough and wheezing.   Cardiovascular: Negative for chest pain, palpitations and leg swelling.  Gastrointestinal: Negative for nausea, vomiting, abdominal pain, diarrhea, constipation, blood in stool, abdominal distention and anal bleeding.  Genitourinary: Positive for urgency, frequency and difficulty urinating. Negative for hematuria and vaginal bleeding.  Musculoskeletal: Negative for arthralgias.  Skin: Negative for rash and wound.  Neurological: Negative for seizures, syncope and headaches.  Hematological: Negative for adenopathy. Does not bruise/bleed easily.   Psychiatric/Behavioral: Negative for confusion. The patient is nervous/anxious.          Physical Exam   Constitutional: She is oriented to person, place, and time. She appears well-developed and well-nourished. No distress.  HENT:   Head: Normocephalic and atraumatic.   Nose: Nose normal.   Mouth/Throat: No oropharyngeal exudate.  Eyes: Conjunctivae and  EOM are normal. Pupils are equal, round, and reactive to light. Left eye exhibits no discharge. No scleral icterus.  Neck: Neck supple. No JVD present. No tracheal deviation present. No thyromegaly present.  Cardiovascular: Normal rate, regular rhythm and intact distal pulses.    Murmur heard. Soft systolic murmur  Pulmonary/Chest: Effort normal and breath sounds normal. No respiratory distress. She has no wheezes. She has no rales. She exhibits no tenderness.    Bilateral axillary scars, and no axillary adenopathy. 2 lumpectomy incisions on the left and one on the right. Bruise from recent biopsy upper outer quadrant on the right. Nipple and areolar complexes is normal.  Abdominal: Soft. Bowel sounds are normal. She exhibits no distension and no mass. There is no tenderness. There is no rebound and no guarding.  Musculoskeletal: She exhibits no edema and no tenderness.  Lymphadenopathy:    She has no cervical adenopathy.  Neurological: She is alert and oriented to person, place, and time. She exhibits normal muscle tone. Coordination normal.  Skin: Skin is warm. No rash noted. She is not diaphoretic. No erythema. No pallor.  Psychiatric: She has a normal mood and affect. Her behavior is normal. Judgment and thought content normal.     Data Reviewed Imaging studies. Histology. The breast diagnostic profile. Treatment plan coordinated with Dr. Darnelle Catalan and Dr. Mitzi Hansen    Assessment    Invasive ductal carcinoma right breast, lateral, recurrent. Clinical stage T1c., N0.   History right partial mastectomy, axillary node surgery  and radiation therapy in 1994. Upper inner quadrant   History two separate lumpectomies o left, one involving axillary node surgery and radiation therapy.  Right rib pain, due to benign, healing right 7,8,9  rib fractures  10 mm right lung nodule, sup. Segment RLL, indeterminant etiology.  Hypertension   Legal blindness   Spastic bladder      Plan    I had a very long discussion with the patient and her sons. We discussed standard of care. We discussed mastectomy versus lumpectomy. We discussed doing nothing. We discussed antiestrogen therapy alone. Because of her advanced age we leaned away from mastectomy thinking that might be too morbid and without significant survival benefit. She opted for a right partial mastectomy with needle localization. She will decide whether she will take antiestrogen therapy later.    She will be scheduled for right partial mastectomy with needle localization in the near future.  Dr. Darnelle Catalan will follow right lung nodule.   I discussed the indications, details, techniques, and numerous risks of the surgery with the patient her sons. She is aware of the risk of bleeding, infection, recurrent cancer, anesthesia complications, skin necrosis, and other unforseen problems. She understands all these issues and all of her questions are answered. She agrees with this plan.          [name]. Derrell Lolling, M.D., [facility] General and Minimally invasive Surgery Breast and Colorectal Surgery Office:   [phone] Pager:   [phone]

## 2013-05-21 ENCOUNTER — Encounter (HOSPITAL_COMMUNITY)
Admission: RE | Disposition: A | Discharge status: Home / Self Care | Payer: Self-pay | Source: Ambulatory Visit | Attending: General Surgery

## 2013-05-21 ENCOUNTER — Ambulatory Visit (HOSPITAL_COMMUNITY)
Admission: RE | Admit: 2013-05-21 | Discharge: 2013-05-21 | Disposition: A | Discharge status: Home / Self Care | Payer: Medicare Other | Source: Ambulatory Visit | Attending: General Surgery | Admitting: General Surgery

## 2013-05-21 ENCOUNTER — Ambulatory Visit
Admission: RE | Admit: 2013-05-21 | Discharge: 2013-05-21 | Disposition: A | Discharge status: Home / Self Care | Payer: Medicare Other | Source: Ambulatory Visit | Attending: General Surgery | Admitting: General Surgery

## 2013-05-21 ENCOUNTER — Encounter (HOSPITAL_COMMUNITY): Payer: Self-pay | Admitting: *Deleted

## 2013-05-21 ENCOUNTER — Encounter (HOSPITAL_COMMUNITY): Payer: Self-pay | Admitting: Vascular Surgery

## 2013-05-21 ENCOUNTER — Ambulatory Visit (HOSPITAL_COMMUNITY): Payer: Medicare Other | Admitting: Anesthesiology

## 2013-05-21 DIAGNOSIS — R32 Unspecified urinary incontinence: Secondary | ICD-10-CM | POA: Diagnosis not present

## 2013-05-21 DIAGNOSIS — R911 Solitary pulmonary nodule: Secondary | ICD-10-CM | POA: Insufficient documentation

## 2013-05-21 DIAGNOSIS — F329 Major depressive disorder, single episode, unspecified: Secondary | ICD-10-CM | POA: Insufficient documentation

## 2013-05-21 DIAGNOSIS — C50919 Malignant neoplasm of unspecified site of unspecified female breast: Secondary | ICD-10-CM | POA: Diagnosis not present

## 2013-05-21 DIAGNOSIS — H548 Legal blindness, as defined in USA: Secondary | ICD-10-CM | POA: Diagnosis not present

## 2013-05-21 DIAGNOSIS — F3289 Other specified depressive episodes: Secondary | ICD-10-CM | POA: Insufficient documentation

## 2013-05-21 DIAGNOSIS — Z170 Estrogen receptor positive status [ER+]: Secondary | ICD-10-CM | POA: Insufficient documentation

## 2013-05-21 DIAGNOSIS — H543 Unqualified visual loss, both eyes: Secondary | ICD-10-CM | POA: Diagnosis not present

## 2013-05-21 DIAGNOSIS — D0590 Unspecified type of carcinoma in situ of unspecified breast: Secondary | ICD-10-CM | POA: Diagnosis not present

## 2013-05-21 DIAGNOSIS — Z79899 Other long term (current) drug therapy: Secondary | ICD-10-CM | POA: Diagnosis not present

## 2013-05-21 DIAGNOSIS — F411 Generalized anxiety disorder: Secondary | ICD-10-CM | POA: Diagnosis not present

## 2013-05-21 DIAGNOSIS — C50411 Malignant neoplasm of upper-outer quadrant of right female breast: Secondary | ICD-10-CM

## 2013-05-21 DIAGNOSIS — Z888 Allergy status to other drugs, medicaments and biological substances status: Secondary | ICD-10-CM | POA: Insufficient documentation

## 2013-05-21 DIAGNOSIS — I10 Essential (primary) hypertension: Secondary | ICD-10-CM | POA: Insufficient documentation

## 2013-05-21 DIAGNOSIS — Z923 Personal history of irradiation: Secondary | ICD-10-CM | POA: Diagnosis not present

## 2013-05-21 DIAGNOSIS — N3289 Other specified disorders of bladder: Secondary | ICD-10-CM | POA: Diagnosis not present

## 2013-05-21 DIAGNOSIS — H9190 Unspecified hearing loss, unspecified ear: Secondary | ICD-10-CM | POA: Insufficient documentation

## 2013-05-21 HISTORY — PX: PARTIAL MASTECTOMY WITH NEEDLE LOCALIZATION: SHX6008

## 2013-05-21 SURGERY — PARTIAL MASTECTOMY WITH NEEDLE LOCALIZATION
Anesthesia: General | Site: Breast | Laterality: Right | Wound class: Clean

## 2013-05-21 MED ORDER — HYDROCODONE-ACETAMINOPHEN 5-325 MG PO TABS: 1.0000 | ORAL_TABLET | ORAL | Status: DC | PRN

## 2013-05-21 MED ORDER — FENTANYL CITRATE 0.05 MG/ML IJ SOLN
INTRAMUSCULAR | Status: DC | PRN
  Administered 2013-05-21: 50 ug via INTRAVENOUS

## 2013-05-21 MED ORDER — BUPIVACAINE-EPINEPHRINE PF 0.25-1:200000 % IJ SOLN
INTRAMUSCULAR | Status: AC
  Filled 2013-05-21: qty 30

## 2013-05-21 MED ORDER — CHLORHEXIDINE GLUCONATE 4 % EX LIQD: 1.0000 "application " | Freq: Once | CUTANEOUS | Status: DC

## 2013-05-21 MED ORDER — LACTATED RINGERS IV SOLN
INTRAVENOUS | Status: DC | PRN
  Administered 2013-05-21: 13:00:00 via INTRAVENOUS

## 2013-05-21 MED ORDER — LACTATED RINGERS IV SOLN
Freq: Once | INTRAVENOUS | Status: AC
  Administered 2013-05-21: 13:00:00 via INTRAVENOUS

## 2013-05-21 MED ORDER — BUPIVACAINE-EPINEPHRINE 0.25% -1:200000 IJ SOLN
INTRAMUSCULAR | Status: DC | PRN
  Administered 2013-05-21: 9 mL

## 2013-05-21 MED ORDER — LIDOCAINE HCL (CARDIAC) 20 MG/ML IV SOLN
INTRAVENOUS | Status: DC | PRN
  Administered 2013-05-21: 40 mg via INTRAVENOUS

## 2013-05-21 MED ORDER — PROPOFOL 10 MG/ML IV BOLUS
INTRAVENOUS | Status: DC | PRN
  Administered 2013-05-21: 120 mg via INTRAVENOUS

## 2013-05-21 MED ORDER — GLYCOPYRROLATE 0.2 MG/ML IJ SOLN
INTRAMUSCULAR | Status: DC | PRN
  Administered 2013-05-21: 0.4 mg via INTRAVENOUS

## 2013-05-21 MED ORDER — ONDANSETRON HCL 4 MG/2ML IJ SOLN: 4.0000 mg | Freq: Once | INTRAMUSCULAR | Status: DC | PRN

## 2013-05-21 MED ORDER — HYDROMORPHONE HCL PF 1 MG/ML IJ SOLN: 0.2500 mg | INTRAMUSCULAR | Status: DC | PRN

## 2013-05-21 MED ORDER — 0.9 % SODIUM CHLORIDE (POUR BTL) OPTIME
TOPICAL | Status: DC | PRN
  Administered 2013-05-21: 1000 mL

## 2013-05-21 MED ORDER — ONDANSETRON HCL 4 MG/2ML IJ SOLN
INTRAMUSCULAR | Status: DC | PRN
  Administered 2013-05-21: 4 mg via INTRAVENOUS

## 2013-05-21 SURGICAL SUPPLY — 41 items
BLADE SURG 15 STRL LF DISP TIS (BLADE) ×1 IMPLANT
BLADE SURG 15 STRL SS (BLADE) ×1
CANISTER SUCTION 2500CC (MISCELLANEOUS) ×2 IMPLANT
CHLORAPREP W/TINT 26ML (MISCELLANEOUS) ×2 IMPLANT
CLOTH BEACON ORANGE TIMEOUT ST (SAFETY) ×2 IMPLANT
COVER SURGICAL LIGHT HANDLE (MISCELLANEOUS) ×2 IMPLANT
DERMABOND ADVANCED (GAUZE/BANDAGES/DRESSINGS) ×1
DERMABOND ADVANCED .7 DNX12 (GAUZE/BANDAGES/DRESSINGS) ×1 IMPLANT
DEVICE DUBIN SPECIMEN MAMMOGRA (MISCELLANEOUS) ×2 IMPLANT
DRAPE CHEST BREAST 15X10 FENES (DRAPES) ×2 IMPLANT
DRAPE UTILITY 15X26 W/TAPE STR (DRAPE) ×4 IMPLANT
ELECT CAUTERY BLADE 6.4 (BLADE) ×2 IMPLANT
ELECT REM PT RETURN 9FT ADLT (ELECTROSURGICAL) ×2
ELECTRODE REM PT RTRN 9FT ADLT (ELECTROSURGICAL) ×1 IMPLANT
GLOVE BIO SURGEON STRL SZ7.5 (GLOVE) ×2 IMPLANT
GLOVE BIOGEL PI IND STRL 7.0 (GLOVE) ×1 IMPLANT
GLOVE BIOGEL PI IND STRL 7.5 (GLOVE) ×1 IMPLANT
GLOVE BIOGEL PI INDICATOR 7.0 (GLOVE) ×1
GLOVE BIOGEL PI INDICATOR 7.5 (GLOVE) ×1
GLOVE EUDERMIC 7 POWDERFREE (GLOVE) ×4 IMPLANT
GLOVE SURG SS PI 7.0 STRL IVOR (GLOVE) ×2 IMPLANT
GOWN STRL NON-REIN LRG LVL3 (GOWN DISPOSABLE) ×2 IMPLANT
GOWN STRL REIN XL XLG (GOWN DISPOSABLE) ×2 IMPLANT
KIT BASIN OR (CUSTOM PROCEDURE TRAY) ×2 IMPLANT
KIT MARKER MARGIN INK (KITS) ×2 IMPLANT
KIT ROOM TURNOVER OR (KITS) ×2 IMPLANT
NEEDLE HYPO 25GX1X1/2 BEV (NEEDLE) ×2 IMPLANT
NS IRRIG 1000ML POUR BTL (IV SOLUTION) ×2 IMPLANT
PACK SURGICAL SETUP 50X90 (CUSTOM PROCEDURE TRAY) ×2 IMPLANT
PAD ARMBOARD 7.5X6 YLW CONV (MISCELLANEOUS) ×2 IMPLANT
PENCIL BUTTON HOLSTER BLD 10FT (ELECTRODE) ×2 IMPLANT
SPONGE LAP 18X18 X RAY DECT (DISPOSABLE) ×2 IMPLANT
SUT MNCRL AB 4-0 PS2 18 (SUTURE) ×2 IMPLANT
SUT SILK 2 0 SH (SUTURE) ×2 IMPLANT
SUT VIC AB 3-0 SH 18 (SUTURE) ×2 IMPLANT
SYR BULB 3OZ (MISCELLANEOUS) ×2 IMPLANT
SYR CONTROL 10ML LL (SYRINGE) ×2 IMPLANT
TOWEL OR 17X24 6PK STRL BLUE (TOWEL DISPOSABLE) ×2 IMPLANT
TOWEL OR 17X26 10 PK STRL BLUE (TOWEL DISPOSABLE) ×2 IMPLANT
TUBE CONNECTING 12X1/4 (SUCTIONS) ×2 IMPLANT
YANKAUER SUCT BULB TIP NO VENT (SUCTIONS) ×2 IMPLANT

## 2013-05-21 NOTE — Anesthesia Preprocedure Evaluation (Addendum)
Anesthesia Evaluation  Patient identified by MRN, date of birth, ID band Patient awake    Reviewed: Allergy & Precautions, H&P , NPO status , Patient's Chart, lab work & pertinent test results  Airway Mallampati: II TM Distance: >3 FB Neck ROM: Full    Dental  (+) Teeth Intact and Dental Advisory Given   Pulmonary neg pulmonary ROS,  breath sounds clear to auscultation  Pulmonary exam normal       Cardiovascular hypertension, Pt. on medications + Valvular Problems/Murmurs Rhythm:Regular Rate:Bradycardia     Neuro/Psych PSYCHIATRIC DISORDERS Anxiety Depression    GI/Hepatic negative GI ROS, Neg liver ROS,   Endo/Other  negative endocrine ROS  Renal/GU negative Renal ROS     Musculoskeletal  (+) Arthritis -, Osteoarthritis,    Abdominal Normal abdominal exam  (+)   Peds  Hematology negative hematology ROS (+)   Anesthesia Other Findings Teeth fragile the ones which remain.  Reproductive/Obstetrics                         Anesthesia Physical Anesthesia Plan  ASA: III  Anesthesia Plan: General   Post-op Pain Management:    Induction: Intravenous  Airway Management Planned: LMA  Additional Equipment:   Intra-op Plan:   Post-operative Plan: Extubation in OR  Informed Consent: I have reviewed the patients History and Physical, chart, labs and discussed the procedure including the risks, benefits and alternatives for the proposed anesthesia with the patient or authorized representative who has indicated his/her understanding and acceptance.   Dental advisory given  Plan Discussed with: CRNA and Anesthesiologist  Anesthesia Plan Comments:         Anesthesia Quick Evaluation

## 2013-05-21 NOTE — Interval H&P Note (Signed)
History and Physical Interval Note:  05/21/2013 12:31 PM  Alejandra Grant  has presented today for surgery, with the diagnosis of recurrent cancer right breast  The goals and the  various methods of treatment have been discussed with the patient and family. After consideration of risks, benefits and other options for treatment, the patient has consented to  Procedure(s): PARTIAL MASTECTOMY WITH NEEDLE LOCALIZATION (Right) as a surgical intervention .  The patient's history has been reviewed, patient examined today, no change in status, stable for surgery.  I have reviewed the patient's chart and labs.  Questions were answered to the patient's and her son's satisfaction.     [name]

## 2013-05-21 NOTE — Preoperative (Signed)
Beta Blockers   Took Hydralazine this am,  Pt is on Timolol opthalmic gtts

## 2013-05-21 NOTE — Transfer of Care (Signed)
Immediate Anesthesia Transfer of Care Note  Patient: Alejandra Grant  Procedure(s) Performed: Procedure(s): PARTIAL MASTECTOMY WITH NEEDLE LOCALIZATION (Right)  Patient Location: PACU  Anesthesia Type:General  Level of Consciousness: awake, alert  and oriented  Airway & Oxygen Therapy: Patient Spontanous Breathing and Patient connected to nasal cannula oxygen  Post-op Assessment: Report given to PACU RN and Post -op Vital signs reviewed and stable  Post vital signs: Reviewed and stable  Complications: No apparent anesthesia complications

## 2013-05-21 NOTE — Op Note (Signed)
Patient Name:           Alejandra Grant   Date of Surgery:        05/21/2013  Pre op Diagnosis:     Recurrent invasive ductal carcinoma right breast, lateral, clinical stage T1c., N0   Post op Diagnosis:    Same  Procedure:                 Right partial mastectomy with needle localization  Surgeon:                     [name]. Derrell Lolling, M.D., FACS  Assistant:                      None  Operative Indications:   Alejandra Grant is a 79 y.o. female. She is referred by Dr. Anselmo Pickler at the [facility] for evaluation of another invasive cancer of the right breast laterally. Her primary care physician is Dr. Laurann Montana. She was evaluated in the [facility] on 04/25/13 by Dr. Darnelle Catalan, Dr. Mitzi Hansen, and me.  This patient is a very elderly and came with her 2 sons. She is legally blind but is otherwise independent and ambulatory. Under treatment for hypertension. Has had a lumbar laminectomy and a fractured elbow and is followed with a spastic bladder.  She has had 3 prior breast cancers. In 1991 she underwent left partial mastectomy and left axillary lymph node surgery by Dr. Francina Ames. She had postop radiation therapy. In 1994 she underwent right partial mastectomy Dr. Francina Ames with postop radiation therapy and was on tamoxifen. In 1999 she underwent a left partial mastectomy Dr. Francina Ames. At some point she took arimidex as well. She has not been on any specific treatment for 10 years or more.  Recent mammograms show a 1.6 cm area of microcalcifications deep within the upper outer quadrant of the right breast. Left breast mammogram was normal in November 2013. Image guided biopsy of the right breast showed high-grade DCIS and a focus of microinvasion, receptor positive, HER-2-negative. She has not had an MRI. She mentions some right rib pain, which turned out to be old rib fractures, non-neoplastic.  We discussed different options for therapy . We discussed partial mastectomy, total mastectomy, use of  antiestrogen therapy with or without surgery. She asked about doing nothing and we discussed the natural history of her cancer with progression and ulceration.. She knows if she cannot have any more radiation therapy. It seemed appropriate to everyone to do something about her cancer since she will probably live 5 or 10 years more.. At the end of the discussion, in her individual case, we elected to proceed with right partial mastectomy. She states she will decide whether to take antiestrogen therapy or not later.    Operative Findings:       The wire entered the right breast in the far lateral position at the 9:00 position and was directed medially. Specimen mammogram showed the wire and a marker clip in the near center of the specimen and was felt to be very acceptable by me and by Dr. Deboraha Grant.  Procedure in Detail:          The patient underwent wire localization at the breast center of  [facility] and was then brought to the operating room at [facility]. General anesthesia was induced. The right breast was prepped and draped in a sterile fashion. Surgical time out was performed. Intravenous antibiotics were given.  0.25% Marcaine with epinephrine was used as local infiltration anesthetic.  A transverse radially oriented incision was made in the right breast at the 9:00 position, essentially through the insertion site of the wire. Using  electrocautery I dissected widely around the wire. I could not actually palpate a discrete tumor and so I kept the dissection fairly wide. The specimen was marked with silk sutures and the 6 color ink kit. Specimen mammogram looked good as described above. Specimen was sent to the pathology lab for routine histology. Hemostasis was excellent and achieved with electrocautery. The wound was irrigated with saline. The deeper breast tissues were closed in layers with interrupted sutures of 3-0 Vicryl and the skin closed with a running subcuticular suture of 4-0 Monocryl and  Dermabond. The patient tolerated the procedure well and was taken to recovery room in stable condition. Counts correct. EBL 10 cc. Complications none.     [name]. Derrell Lolling, M.D., FACS General and Minimally Invasive Surgery Breast and Colorectal Surgery  05/21/2013 2:11 PM

## 2013-05-21 NOTE — Progress Notes (Signed)
Dr. Noreene Larsson notified of pt's elevated BP, MD to evaluate.

## 2013-05-21 NOTE — Anesthesia Postprocedure Evaluation (Signed)
  Anesthesia Post-op Note  Patient: Alejandra Grant  Procedure(s) Performed: Procedure(s): PARTIAL MASTECTOMY WITH NEEDLE LOCALIZATION (Right)  Patient Location: PACU  Anesthesia Type:General  Level of Consciousness: awake, alert  and oriented  Airway and Oxygen Therapy: Patient Spontanous Breathing and Patient connected to nasal cannula oxygen  Post-op Pain: mild  Post-op Assessment: Post-op Vital signs reviewed, Patient's Cardiovascular Status Stable, Respiratory Function Stable, Patent Airway and Pain level controlled  Post-op Vital Signs: stable  Complications: No apparent anesthesia complications

## 2013-05-22 ENCOUNTER — Telehealth (INDEPENDENT_AMBULATORY_CARE_PROVIDER_SITE_OTHER): Payer: Self-pay | Admitting: *Deleted

## 2013-05-22 ENCOUNTER — Encounter (HOSPITAL_COMMUNITY): Payer: Self-pay | Admitting: General Surgery

## 2013-05-22 NOTE — Telephone Encounter (Signed)
Patient called to ask if she should be wearing a bar following her lumpectomy yesterday.  Explained to patient that it really is whatever is most comfortable for her.  Explained that if wearing a bra helps with her comfort level then it is fine to do so but if it causes her pain then go without.  Patient states understanding at this time and agreeable.

## 2013-05-28 ENCOUNTER — Telehealth (INDEPENDENT_AMBULATORY_CARE_PROVIDER_SITE_OTHER): Payer: Self-pay

## 2013-05-28 NOTE — Telephone Encounter (Signed)
Message copied by Ivory Broad on Mon May 28, 2013  3:33 PM ------      Message from: Alejandra Grant Mention      Created: Mon May 28, 2013  2:05 PM       Inform patient of Pathology report,. ------

## 2013-05-28 NOTE — Progress Notes (Signed)
Quick Note:  Inform patient of Pathology report,. ______ 

## 2013-05-28 NOTE — Telephone Encounter (Signed)
I notified the pt of the results.

## 2013-05-31 DIAGNOSIS — H353290 Exudative age-related macular degeneration, unspecified eye, stage unspecified: Secondary | ICD-10-CM | POA: Diagnosis not present

## 2013-05-31 DIAGNOSIS — H31019 Macula scars of posterior pole (postinflammatory) (post-traumatic), unspecified eye: Secondary | ICD-10-CM | POA: Diagnosis not present

## 2013-05-31 DIAGNOSIS — D1809 Hemangioma of other sites: Secondary | ICD-10-CM | POA: Diagnosis not present

## 2013-06-06 ENCOUNTER — Ambulatory Visit (HOSPITAL_BASED_OUTPATIENT_CLINIC_OR_DEPARTMENT_OTHER): Payer: Medicare Other | Admitting: Oncology

## 2013-06-06 ENCOUNTER — Telehealth: Payer: Self-pay | Admitting: *Deleted

## 2013-06-06 VITALS — BP 166/76 | HR 70 | Temp 98.8°F | Resp 18 | Ht 64.0 in | Wt 115.9 lb

## 2013-06-06 DIAGNOSIS — C50419 Malignant neoplasm of upper-outer quadrant of unspecified female breast: Secondary | ICD-10-CM | POA: Diagnosis not present

## 2013-06-06 DIAGNOSIS — C50411 Malignant neoplasm of upper-outer quadrant of right female breast: Secondary | ICD-10-CM

## 2013-06-06 NOTE — Progress Notes (Signed)
ID: Alejandra Grant OB: Dec 22, 1922  MR#: [id number]  CSN#:[id number]  PCP: Cala Bradford, MD GYN:   SUClaud Kelp OTHER MD: Dorothy Puffer, Todd McDiarmid, Serena Colonel, Rupinder Kaur   HISTORY OF PRESENT ILLNESS: Ms. Dibenedetto has a prior history of bilateral lumpectomies, bilateral rest irradiation, and prior treatment with tamoxifen (approximately 4 and half years) and an anastrozole (unknown duration). The data is being obtained from the archives.  More recently some new calcifications were noted in the right breast, and digital right breast mammography with magnification views confirmed a cluster in the posterior third of the breast spanning an area of 1.3 cm. Repeat within 6 months was suggested, and performed 04/02/2013. The patient's breasts are heterogeneously dense. The area of 1.3 cm of microcalcifications had increased in number, and spanned 1.6 cm and this study. Accordingly biopsy was obtained 04/18/2013, and this showed ([id number]) high-grade ductal carcinoma in situ, with a 2 mm area of invasive disease. The invasive tumor was estrogen receptor 100% positive, progesterone receptor negative, with an MIB-1 of 2% and no HER-2 amplification.  The patient's subsequent history is as detailed below  INTERVAL HISTORY: Edithe returns today for followup of her breast cancer, accompanied by her son Tawanna Cooler and the patient's granddaughter. Since her last visit here Maame had her definitive surgery  REVIEW OF SYSTEMS: She did well with the surgery, with no unusual fever, bleeding, or pain. She is "back to baseline" doing her usual daily activities. Her biggest concern remains her vision. A detailed review of systems today was otherwise stable PAST MEDICAL HISTORY: Past Medical History  Diagnosis Date  . Hypertension   . Urinary incontinence   . Depression   . Anxiety   . 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 Heart murmur   . Cancer     breast, skin  . Anemia      PAST SURGICAL HISTORY: Past Surgical History  Procedure Laterality Date  . Breast lumpectomy Bilateral     left 1991,right side 1994  . Abdominal hysterectomy    . Back surgery    . Eye surgery Bilateral     cat  . Tonsillectomy    . Partial mastectomy with needle localization Right 05/21/2013    Procedure: PARTIAL MASTECTOMY WITH NEEDLE LOCALIZATION;  Surgeon: [name], MD;  Location: [facility] OR;  Service: General;  Laterality: Right;    FAMILY HISTORY No family history on file. The patient's mother died giving birth to the patient, at age 77. The patient did not grow up with her father and has very little information regarding him or his side of the family. She believes he died in his 45s in a state hospital. The patient had no brothers or sisters.  GYNECOLOGIC HISTORY:  The patient does not recall how old she was at menarche. She thinks she went through menopause in her 93s. She is GX P2 with first live birth at age 79. The patient is status post hysterectomy but does not know if her ovaries were removed or not.  SOCIAL HISTORY:  The patient worked as a Nutritional therapist. She is a widow and lives by herself. She uses the cervical and Center and other support services to get around, do her shopping, etc. Son Elijah Birk lives in [place] and works in Audiological scientist estate. Son Tawanna Cooler lives in [facility] [place] and is in Sports coach. The patient has 3 grandchildren. She attends the first [employer] locally.  ADVANCED DIRECTIVES: In place   HEALTH MAINTENANCE: History  Substance Use Topics  . Smoking status: Never Smoker   . Smokeless tobacco: Not on file  . Alcohol Use: No     Colonoscopy:  PAP:  Bone density: October 2008, showing osteoporosis (left hip T score -2.6)  Lipid panel:  Allergies  Allergen Reactions  . Aspirin     Bleeding / rash  . Emete-Con (Benzquinamide)     unknown    Current Outpatient Prescriptions  Medication Sig  Dispense Refill  . alendronate (FOSAMAX) 70 MG tablet Take 70 mg by mouth every 7 (seven) days.       . ferrous sulfate 325 (65 FE) MG tablet Take 325 mg by mouth daily with breakfast.      . FLUoxetine (PROZAC) 20 MG capsule Take 20 mg by mouth daily.       . hydrALAZINE (APRESOLINE) 25 MG tablet Take 25-50 mg by mouth 4 (four) times daily. 50 mg in the a.m. , 25 mg at noon, 50 mg at dinner, and 25 mg at bedtime      . HYDROcodone-acetaminophen (NORCO/VICODIN) 5-325 MG per tablet Take 1 tablet by mouth every 4 (four) hours as needed for pain.  15 tablet  1  . mirtazapine (REMERON) 30 MG tablet Take 30 mg by mouth at bedtime.       . Multiple Vitamins-Minerals (ICAPS MV PO) Take by mouth 1 day or 1 dose.      . potassium chloride (KLOR-CON) 8 MEQ tablet Take 16 mEq by mouth 2 (two) times daily.      . pravastatin (PRAVACHOL) 40 MG tablet Take 40 mg by mouth daily.      . timolol (TIMOPTIC) 0.5 % ophthalmic solution Place 1 drop into both eyes 2 (two) times daily.      Marya Landry 40-10-25 MG TABS Take 1 tablet by mouth every morning.        No current facility-administered medications for this visit.    OBJECTIVE: Elderly white woman who appears stated age 79 Vitals:   06/06/13 1354  BP: 166/76  Pulse: 70  Temp: 98.8 F (37.1 C)  Resp: 18     Body mass index is 19.88 kg/(m^2).    ECOG FS: 2  Sclerae unicteric No cervical or supraclavicular adenopathy Lungs good excursion bilaterally Abd benign MSK no peripheral edema, no focal spinal tenderness Neuro: non-focal, pleasant affect Breasts: Status post remote lumpectomies bilaterally and recent right lumpectomy. The incision on the right is healing very nicely. There is no dehiscence, erythema, or unusual tenderness. The right axilla is benign. The left breast s unremarkable  LAB RESULTS:  CMP     Component Value Date/Time   NA 139 05/11/2013 1318   NA 139 04/25/2013 1250   K 3.6 05/11/2013 1318   K 3.8 04/25/2013 1250   CL 99  05/11/2013 1318   CL 102 04/25/2013 1250   CO2 29 05/11/2013 1318   CO2 29 04/25/2013 1250   GLUCOSE 142* 05/11/2013 1318   GLUCOSE 120* 04/25/2013 1250   BUN 22 05/11/2013 1318   BUN 19.5 04/25/2013 1250   CREATININE 0.75 05/11/2013 1318   CREATININE 0.8 04/25/2013 1250   CALCIUM 10.6* 05/11/2013 1318   CALCIUM 10.8* 04/25/2013 1250   PROT 6.9 05/11/2013 1318   PROT 6.7 04/25/2013 1250   ALBUMIN 3.9 05/11/2013 1318   ALBUMIN 3.7 04/25/2013 1250   AST 26 05/11/2013 1318   AST 43* 04/25/2013 1250   ALT 17  05/11/2013 1318   ALT 33 04/25/2013 1250   ALKPHOS 57 05/11/2013 1318   ALKPHOS 50 04/25/2013 1250   BILITOT 0.3 05/11/2013 1318   BILITOT 0.30 04/25/2013 1250   GFRNONAA 73* 05/11/2013 1318   GFRAA 85* 05/11/2013 1318    I No results found for this basename: SPEP,  UPEP,   kappa and lambda light chains    Lab Results  Component Value Date   WBC 7.0 05/11/2013   NEUTROABS 3.3 05/11/2013   HGB 12.8 05/11/2013   HCT 37.5 05/11/2013   MCV 90.4 05/11/2013   PLT 249 05/11/2013      Chemistry      Component Value Date/Time   NA 139 05/11/2013 1318   NA 139 04/25/2013 1250   K 3.6 05/11/2013 1318   K 3.8 04/25/2013 1250   CL 99 05/11/2013 1318   CL 102 04/25/2013 1250   CO2 29 05/11/2013 1318   CO2 29 04/25/2013 1250   BUN 22 05/11/2013 1318   BUN 19.5 04/25/2013 1250   CREATININE 0.75 05/11/2013 1318   CREATININE 0.8 04/25/2013 1250      Component Value Date/Time   CALCIUM 10.6* 05/11/2013 1318   CALCIUM 10.8* 04/25/2013 1250   ALKPHOS 57 05/11/2013 1318   ALKPHOS 50 04/25/2013 1250   AST 26 05/11/2013 1318   AST 43* 04/25/2013 1250   ALT 17 05/11/2013 1318   ALT 33 04/25/2013 1250   BILITOT 0.3 05/11/2013 1318   BILITOT 0.30 04/25/2013 1250       No results found for this basename: LABCA2    No components found with this basename: LABCA125    No results found for this basename: INR,  in the last 168 hours  Urinalysis    Component Value Date/Time   COLORURINE YELLOW 05/11/2013 1317    APPEARANCEUR CLEAR 05/11/2013 1317   LABSPEC 1.014 05/11/2013 1317   PHURINE 7.5 05/11/2013 1317   GLUCOSEU NEGATIVE 05/11/2013 1317   HGBUR NEGATIVE 05/11/2013 1317   BILIRUBINUR NEGATIVE 05/11/2013 1317   KETONESUR NEGATIVE 05/11/2013 1317   PROTEINUR NEGATIVE 05/11/2013 1317   UROBILINOGEN 1.0 05/11/2013 1317   NITRITE NEGATIVE 05/11/2013 1317   LEUKOCYTESUR NEGATIVE 05/11/2013 1317    STUDIES: Chest 2 View  05/11/2013   *RADIOLOGY REPORT*  Clinical Data: Preop for partial mastectomy, hypertension  CHEST - 2 VIEW  Comparison: CT chest of 05/07/2013 and chest x-ray of 05/02/2007  Findings: The lungs are clear but hyperaerated suggesting a degree of emphysema.  Mediastinal contours appear stable and cardiomegaly is stable.  The bones are osteopenic.  Surgical clips overlie the right axilla.  IMPRESSION: Hyperaeration suggestive of emphysema.  Stable mild cardiomegaly.   Original Report Authenticated By: Dwyane Dee, M.D.   Nm Bone Scan Whole Body  05/15/2013   *RADIOLOGY REPORT*  Clinical Data: 79 year old female with recurrent breast cancer. Right chest wall and rib pain.  Breast biopsy 4 weeks ago.  The recent chest CT suggesting subacute right seventh - nine rib fractures.  NUCLEAR MEDICINE WHOLE BODY BONE SCINTIGRAPHY  Technique:  Whole body anterior and posterior images were obtained approximately 3 hours after intravenous injection of radiopharmaceutical.  Radiopharmaceutical: CURIE TC-MDP TECHNETIUM TC 28M MEDRONATE IV KIT  Comparison: Chest CT 05/07/2013.  Findings: Linear arrangement of increased radiotracer activity at the right lower ribs near the costovertebral junctions.  These correspond to the findings on the recent CT, and the linear arrangement and appearance on the comparison does suggest sequelae of trauma.  Expected radiotracer  activity in both kidneys in the bladder.  Mild asymmetric right lower lumbar spine (probably L5) activity appears most likely to be degenerative.  Mild  upper thoracic scoliosis.  Elsewhere homogeneous radiotracer activity in the axial skeleton. Appendicular skeleton activity normal except for degenerative appearing asymmetry at the wrists and feet.  IMPRESSION: 1.  Linear arrangement of increased osseous remodelling in the right lower ribs near the costochondral margins.  In conjunction with recent CT these are most compatible with healing rib fractures. 2. No findings suspicious for metastatic disease to bone.   Original Report Authenticated By: Erskine Speed, M.D.   Mm Rt Plc Breast Loc Dev   1st Lesion  Inc Mammo Guide  05/21/2013   *RADIOLOGY REPORT*  Clinical Data: Patient with recurrent right breast cancer  NEEDLE LOCALIZATION WITH MAMMOGRAPHIC GUIDANCE AND SPECIMEN RADIOGRAPH  Comparison:  Previous exam(s).  Patient presents for needle localization prior to surgery. I met with the patient and we discussed the procedure of needle localization including benefits and alternatives. We discussed the high likelihood of a successful procedure. We discussed the risks of the procedure, including infection, bleeding, tissue injury, and further surgery. Informed, written consent was given. The usual time-out protocol was performed immediately prior to the procedure.  Using mammographic guidance, sterile technique, 2% lidocaine and a 5 cm modified Kopans needle the biopsy site marker in the upper outer right breast was localized using a lateral to medial approach.  The films are marked for Dr. Derrell Lolling. Specimen radiograph was performed at Day Surgery  and confirms the hookwire and clip to be present in the tissue sample.  The specimen is marked for pathology.  IMPRESSION: Needle localization of the right breast.  No apparent complications.   Original Report Authenticated By: Jerene Dilling, M.D.   ASSESSMENT: 79 y.o. [place] woman  (1) status post remote lumpectomies to both breasts, with adjuvant radiation bilaterally, and prior history of tamoxifen and  Arimidex (records to be reviewed)  (2) Status post right breast biopsy 04/18/2013 for ductal carcinoma in situ, high-grade, with an area of microinvasion; the invasive cancer was estrogen receptor positive at 100%, progesterone receptor and HER-2 negative, with anMIB-1 of 2%.  (3)  status post right lumpectomy 05/21/2013 for a pT1b pnX, stage IA invasive ductal carcinoma, with negative margins(closest 1 mm from anterior margin)  (4) genetics testing discussed--patient and family prefer to forego genetic testing  (5) the patient has a 1 cm right lower lobe lung mass of unclear etiology, requiring followup.  PLAN: The patient's overall situation was discussed at this morning's multidisciplinary breast cancer conference. It was felt that anti-estrogens should be considered and these were discussed today at that visit extensively.  I would be concerned regarding using tamoxifen, with its risk of clotting, and this woman given her other comorbidities. The concern with aromatase inhibitors, of course, this axilla ration of bone density loss. The patient is already on Fosamax, she is frail, and I think she would be at increasing risk of fracture if we went with an aromatase inhibitor.  The chance of this tumor recurring distally and taking her life is exceedingly remote. We discussed all this in detail, and I think we all feel comfortable with observation alone in Biance's situation. She will see Dr. Derrell Lolling in 3 months, see me again in February, and have this every visit we will obtain a chest x-ray to take another look at the right lower lobe 1 cm lung mass we need to follow. We are going  to repeat a CT scan a year from now.  The patient is a very good understanding of this plan. She is very much in agreement with that.  Lowella Dell, MD   06/06/2013 2:21 PM

## 2013-06-06 NOTE — Telephone Encounter (Signed)
appt made and printed. Pt plans on getting her cxr on 12/11/13...td

## 2013-06-13 ENCOUNTER — Encounter (INDEPENDENT_AMBULATORY_CARE_PROVIDER_SITE_OTHER): Payer: Self-pay | Admitting: General Surgery

## 2013-06-13 ENCOUNTER — Ambulatory Visit (INDEPENDENT_AMBULATORY_CARE_PROVIDER_SITE_OTHER): Payer: Medicare Other | Admitting: General Surgery

## 2013-06-13 VITALS — BP 180/86 | HR 82 | Resp 16 | Ht 64.0 in | Wt 117.0 lb

## 2013-06-13 DIAGNOSIS — C50419 Malignant neoplasm of upper-outer quadrant of unspecified female breast: Secondary | ICD-10-CM

## 2013-06-13 DIAGNOSIS — C50411 Malignant neoplasm of upper-outer quadrant of right female breast: Secondary | ICD-10-CM

## 2013-06-13 NOTE — Patient Instructions (Signed)
You are recovering from your right lumpectomy without any obvious surgical complications.  Keep your appointment with Dr. Darnelle Catalan in 6 months, and be sure to get the chest x-ray and CT scan of chest at that time.  Since you are going to see Dr. Darnelle Catalan in 6 months, and I advise you to return to see me in one year after you get your annual mammograms.

## 2013-06-13 NOTE — Progress Notes (Signed)
Patient ID: Alejandra Grant, female   DOB: 12-28-1922, 79 y.o.   MRN: [id number] History: This patient underwent right partial mastectomy with needle localization on 05/21/2013. She had a invasive ductal carcinoma, stage T1b,  NX, stage IA, negative margins, 1 mm anterior margin. She also has a 1 cm right lower lobe lung mass. She has no complaints about her wound healing. She feels fine. She saw Dr. Darnelle Catalan last week. He felt that antiestrogen therapy was associated with high-risk for complications and elected to follow her with observation only. I agree with this. He is going to see her in 6 months and repeat CT scan chest and chest x-ray. She has had numerous bilateral breast surgery for bilateral breast cancer.  Exam: Patient is alert. Elderly. Ambulating independently. Her sons are not with her today Breasts: Right breast exam shows multiple scars. Recent lumpectomy scar laterally is healing uneventfully. No hematoma. No infection. No drainage.  Assessment: Invasive ductal carcinoma right breast(second recurrence)  , 9:00 position, pathologic stage T1b, NX, negative margins, close but negative anteriorly. Recovering uneventfully in the early postoperative period History right breast cancer x2, 1991 and again in 1994. Status post radiation therapy. Status post tamoxifen therapy History left breast cancer in 1999, treated with radiation therapy and arimidex.  Plan: Wound care discussed. ROM exercises. Follow up with Dr. Darnelle Catalan in 6 months. He will repeat her chest CT for interval evaluationof her right lung nodule Return to see me in one year after she gets her annual mammograms.   [name]. Derrell Lolling, M.D., [facility] General and Minimally invasive Surgery Breast and Colorectal Surgery Office:   [phone] Pager:   [phone]

## 2013-07-23 DIAGNOSIS — D509 Iron deficiency anemia, unspecified: Secondary | ICD-10-CM | POA: Diagnosis not present

## 2013-07-23 DIAGNOSIS — E785 Hyperlipidemia, unspecified: Secondary | ICD-10-CM | POA: Diagnosis not present

## 2013-07-23 DIAGNOSIS — I10 Essential (primary) hypertension: Secondary | ICD-10-CM | POA: Diagnosis not present

## 2013-07-23 DIAGNOSIS — Z23 Encounter for immunization: Secondary | ICD-10-CM | POA: Diagnosis not present

## 2013-07-23 DIAGNOSIS — G2581 Restless legs syndrome: Secondary | ICD-10-CM | POA: Diagnosis not present

## 2013-08-14 ENCOUNTER — Other Ambulatory Visit: Payer: Self-pay | Admitting: Obstetrics

## 2013-08-14 ENCOUNTER — Other Ambulatory Visit: Payer: Self-pay | Admitting: Family Medicine

## 2013-08-14 DIAGNOSIS — Z853 Personal history of malignant neoplasm of breast: Secondary | ICD-10-CM

## 2013-08-14 DIAGNOSIS — Z9889 Other specified postprocedural states: Secondary | ICD-10-CM

## 2013-08-17 ENCOUNTER — Other Ambulatory Visit: Payer: Self-pay | Admitting: *Deleted

## 2013-09-10 ENCOUNTER — Ambulatory Visit
Admission: RE | Admit: 2013-09-10 | Discharge: 2013-09-10 | Disposition: A | Discharge status: Home / Self Care | Payer: Medicare Other | Source: Ambulatory Visit | Attending: Family Medicine | Admitting: Family Medicine

## 2013-09-10 DIAGNOSIS — Z9889 Other specified postprocedural states: Secondary | ICD-10-CM

## 2013-09-10 DIAGNOSIS — R928 Other abnormal and inconclusive findings on diagnostic imaging of breast: Secondary | ICD-10-CM | POA: Diagnosis not present

## 2013-09-10 DIAGNOSIS — Z853 Personal history of malignant neoplasm of breast: Secondary | ICD-10-CM

## 2013-09-25 DIAGNOSIS — H353290 Exudative age-related macular degeneration, unspecified eye, stage unspecified: Secondary | ICD-10-CM | POA: Diagnosis not present

## 2013-09-25 DIAGNOSIS — H409 Unspecified glaucoma: Secondary | ICD-10-CM | POA: Diagnosis not present

## 2013-09-25 DIAGNOSIS — D1809 Hemangioma of other sites: Secondary | ICD-10-CM | POA: Diagnosis not present

## 2013-09-25 DIAGNOSIS — H31019 Macula scars of posterior pole (postinflammatory) (post-traumatic), unspecified eye: Secondary | ICD-10-CM | POA: Diagnosis not present

## 2013-09-25 DIAGNOSIS — H4010X0 Unspecified open-angle glaucoma, stage unspecified: Secondary | ICD-10-CM | POA: Diagnosis not present

## 2013-10-01 DIAGNOSIS — H903 Sensorineural hearing loss, bilateral: Secondary | ICD-10-CM | POA: Diagnosis not present

## 2013-10-09 DIAGNOSIS — H353290 Exudative age-related macular degeneration, unspecified eye, stage unspecified: Secondary | ICD-10-CM | POA: Diagnosis not present

## 2013-10-09 DIAGNOSIS — H31019 Macula scars of posterior pole (postinflammatory) (post-traumatic), unspecified eye: Secondary | ICD-10-CM | POA: Diagnosis not present

## 2013-10-09 DIAGNOSIS — D1809 Hemangioma of other sites: Secondary | ICD-10-CM | POA: Diagnosis not present

## 2013-10-12 ENCOUNTER — Telehealth: Payer: Self-pay | Admitting: *Deleted

## 2013-10-12 NOTE — Telephone Encounter (Signed)
sw pt informed the pt that GCM is on call 12/11/13. gv appt for 12/05/13 w/labs@ 1:30p and ov@ 2pm. Pt is aware...td

## 2013-11-27 DIAGNOSIS — H6120 Impacted cerumen, unspecified ear: Secondary | ICD-10-CM | POA: Diagnosis not present

## 2013-11-27 DIAGNOSIS — H902 Conductive hearing loss, unspecified: Secondary | ICD-10-CM | POA: Diagnosis not present

## 2013-11-29 DIAGNOSIS — H31019 Macula scars of posterior pole (postinflammatory) (post-traumatic), unspecified eye: Secondary | ICD-10-CM | POA: Diagnosis not present

## 2013-11-29 DIAGNOSIS — H353290 Exudative age-related macular degeneration, unspecified eye, stage unspecified: Secondary | ICD-10-CM | POA: Diagnosis not present

## 2013-11-29 DIAGNOSIS — H409 Unspecified glaucoma: Secondary | ICD-10-CM | POA: Diagnosis not present

## 2013-11-29 DIAGNOSIS — D1809 Hemangioma of other sites: Secondary | ICD-10-CM | POA: Diagnosis not present

## 2013-11-29 DIAGNOSIS — H4010X0 Unspecified open-angle glaucoma, stage unspecified: Secondary | ICD-10-CM | POA: Diagnosis not present

## 2013-11-29 DIAGNOSIS — H18429 Band keratopathy, unspecified eye: Secondary | ICD-10-CM | POA: Diagnosis not present

## 2013-12-05 ENCOUNTER — Ambulatory Visit (HOSPITAL_BASED_OUTPATIENT_CLINIC_OR_DEPARTMENT_OTHER): Payer: Medicare Other | Admitting: Oncology

## 2013-12-05 ENCOUNTER — Other Ambulatory Visit (HOSPITAL_BASED_OUTPATIENT_CLINIC_OR_DEPARTMENT_OTHER): Payer: Medicare Other

## 2013-12-05 ENCOUNTER — Ambulatory Visit (HOSPITAL_COMMUNITY)
Admission: RE | Admit: 2013-12-05 | Discharge: 2013-12-05 | Disposition: A | Discharge status: Home / Self Care | Payer: Medicare Other | Source: Ambulatory Visit | Attending: Oncology | Admitting: Oncology

## 2013-12-05 ENCOUNTER — Telehealth: Payer: Self-pay | Admitting: *Deleted

## 2013-12-05 VITALS — BP 148/77 | HR 66 | Temp 97.5°F | Resp 17 | Ht 64.0 in | Wt 116.7 lb

## 2013-12-05 DIAGNOSIS — M948X9 Other specified disorders of cartilage, unspecified sites: Secondary | ICD-10-CM | POA: Insufficient documentation

## 2013-12-05 DIAGNOSIS — C50919 Malignant neoplasm of unspecified site of unspecified female breast: Secondary | ICD-10-CM | POA: Diagnosis not present

## 2013-12-05 DIAGNOSIS — Z170 Estrogen receptor positive status [ER+]: Secondary | ICD-10-CM

## 2013-12-05 DIAGNOSIS — C50419 Malignant neoplasm of upper-outer quadrant of unspecified female breast: Secondary | ICD-10-CM | POA: Diagnosis not present

## 2013-12-05 DIAGNOSIS — C50411 Malignant neoplasm of upper-outer quadrant of right female breast: Secondary | ICD-10-CM

## 2013-12-05 DIAGNOSIS — I771 Stricture of artery: Secondary | ICD-10-CM | POA: Diagnosis not present

## 2013-12-05 DIAGNOSIS — R222 Localized swelling, mass and lump, trunk: Secondary | ICD-10-CM

## 2013-12-05 LAB — COMPREHENSIVE METABOLIC PANEL (CC13)
ALK PHOS: 50 U/L (ref 40–150)
ALT: 21 U/L (ref 0–55)
ANION GAP: 9 meq/L (ref 3–11)
AST: 26 U/L (ref 5–34)
Albumin: 3.9 g/dL (ref 3.5–5.0)
BILIRUBIN TOTAL: 0.24 mg/dL (ref 0.20–1.20)
BUN: 21.2 mg/dL (ref 7.0–26.0)
CO2: 30 mEq/L — ABNORMAL HIGH (ref 22–29)
CREATININE: 0.8 mg/dL (ref 0.6–1.1)
Calcium: 10.4 mg/dL (ref 8.4–10.4)
Chloride: 103 mEq/L (ref 98–109)
GLUCOSE: 124 mg/dL (ref 70–140)
Potassium: 4.2 mEq/L (ref 3.5–5.1)
SODIUM: 142 meq/L (ref 136–145)
TOTAL PROTEIN: 6.5 g/dL (ref 6.4–8.3)

## 2013-12-05 LAB — CBC WITH DIFFERENTIAL/PLATELET
BASO%: 0.7 % (ref 0.0–2.0)
BASOS ABS: 0 10*3/uL (ref 0.0–0.1)
EOS%: 2.7 % (ref 0.0–7.0)
Eosinophils Absolute: 0.2 10*3/uL (ref 0.0–0.5)
HEMATOCRIT: 37.1 % (ref 34.8–46.6)
HEMOGLOBIN: 12.2 g/dL (ref 11.6–15.9)
LYMPH#: 2.3 10*3/uL (ref 0.9–3.3)
LYMPH%: 30.8 % (ref 14.0–49.7)
MCH: 30.9 pg (ref 25.1–34.0)
MCHC: 32.9 g/dL (ref 31.5–36.0)
MCV: 93.9 fL (ref 79.5–101.0)
MONO#: 0.8 10*3/uL (ref 0.1–0.9)
MONO%: 10.7 % (ref 0.0–14.0)
NEUT#: 4.1 10*3/uL (ref 1.5–6.5)
NEUT%: 55.1 % (ref 38.4–76.8)
PLATELETS: 259 10*3/uL (ref 145–400)
RBC: 3.96 10*6/uL (ref 3.70–5.45)
RDW: 16.3 % — ABNORMAL HIGH (ref 11.2–14.5)
WBC: 7.4 10*3/uL (ref 3.9–10.3)

## 2013-12-05 NOTE — Telephone Encounter (Signed)
appts made and printed. Pt is aware of her cxr...td

## 2013-12-05 NOTE — Progress Notes (Signed)
ID: Alejandra Grant OB: [birth year]  MR#: [id number]  HKV#:[id number]  PCP: Vidal Schwalbe, MD GYN:   SUFanny Skates OTHER MD: Kyung Rudd, Todd McDiarmid, Izora Gala, Rupinder Orene Desanctis   HISTORY OF PRESENT ILLNESS: [name] has a prior history of bilateral lumpectomies, bilateral rest irradiation, and prior treatment with tamoxifen (approximately 4 and half years) and an anastrozole (unknown duration). The data is being obtained from the archives.  More recently some new calcifications were noted in the right breast, and digital right breast mammography with magnification views confirmed a cluster in the posterior third of the breast spanning an area of 1.3 cm. Repeat within 6 months was suggested, and performed 04/02/2013. The patient's breasts are heterogeneously dense. The area of 1.3 cm of microcalcifications had increased in number, and spanned 1.6 cm and this study. Accordingly biopsy was obtained 04/18/2013, and this showed ([id number]) high-grade ductal carcinoma in situ, with a 2 mm area of invasive disease. The invasive tumor was estrogen receptor 100% positive, progesterone receptor negative, with an MIB-1 of 2% and no HER-2 amplification.  The patient's subsequent history is as detailed below  INTERVAL HISTORY: Alejandra Grant returns today for followup of her breast cancer. The interval history is unremarkable. She enjoyed the holidays, which is spent with the family visiting. She was driven here today by her friend, normal  REVIEW OF SYSTEMS: She is having some problems with her hearing aid. Her eye problems are followed at [facility] and she is very satisfied with the care she is getting there. She has a balance problem but there have been no falls and certainly she doesn't have any unusual headaches, nausea, vomiting, or dizziness. There is no pain. She has no cough, phlegm production, or pleurisy. A detailed review of systems today was otherwise stable  PAST MEDICAL  HISTORY: Past Medical History  Diagnosis Date  . Hypertension   . Urinary incontinence   . Depression   . Anxiety   . 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 Heart murmur   . Cancer     breast, skin  . Anemia     PAST SURGICAL HISTORY: Past Surgical History  Procedure Laterality Date  . Breast lumpectomy Bilateral     left 1991,right side 1994  . Abdominal hysterectomy    . Back surgery    . Eye surgery Bilateral     cat  . Tonsillectomy    . Partial mastectomy with needle localization Right 05/21/2013    Procedure: PARTIAL MASTECTOMY WITH NEEDLE LOCALIZATION;  Surgeon: Adin Hector, MD;  Location: [facility];  Service: General;  Laterality: Right;    FAMILY HISTORY No family history on file. The patient's mother died giving birth to the patient, at age 80. The patient did not grow up with her father and has very little information regarding him or his side of the family. She believes he died in his 5s in a state hospital. The patient had no brothers or sisters.  GYNECOLOGIC HISTORY:  The patient does not recall how old she was at menarche. She thinks she went through menopause in her 57s. She is GX P2 with first live birth at age 57. The patient is status post hysterectomy but does not know if her ovaries were removed or not.  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 She uses the [employer] and other support services to  get around, do her shopping, etc. Son [name] lives in [place] and works in Scientist, research (life sciences) estate. Son [name] lives in [place] and is in Economist. The patient has 3 grandchildren. She attends the first [employer] locally.    ADVANCED DIRECTIVES: In place   HEALTH MAINTENANCE: History  Substance Use Topics  . Smoking status: Never Smoker   . Smokeless tobacco: Not on file  . Alcohol Use: No     Colonoscopy:  PAP:  Bone density: October 2008,  showing osteoporosis (left hip T score -2.6)  Lipid panel:  Allergies  Allergen Reactions  . Aspirin     Bleeding / rash  . Emete-Con [Benzquinamide]     unknown    Current Outpatient Prescriptions  Medication Sig Dispense Refill  . alendronate (FOSAMAX) 70 MG tablet Take 70 mg by mouth every 7 (seven) days.       . ferrous sulfate 325 (65 FE) MG tablet Take 325 mg by mouth daily with breakfast.      . FLUoxetine (PROZAC) 20 MG capsule Take 20 mg by mouth daily.       . hydrALAZINE (APRESOLINE) 25 MG tablet Take 25-50 mg by mouth 4 (four) times daily. 50 mg in the a.m. , 25 mg at noon, 50 mg at dinner, and 25 mg at bedtime      . HYDROcodone-acetaminophen (NORCO/VICODIN) 5-325 MG per tablet Take 1 tablet by mouth every 4 (four) hours as needed for pain.  15 tablet  1  . mirtazapine (REMERON) 30 MG tablet Take 30 mg by mouth at bedtime.       . Multiple Vitamins-Minerals (ICAPS MV PO) Take by mouth 1 day or 1 dose.      . potassium chloride (KLOR-CON) 8 MEQ tablet Take 16 mEq by mouth 2 (two) times daily.      . pravastatin (PRAVACHOL) 40 MG tablet Take 40 mg by mouth daily.      . timolol (TIMOPTIC) 0.5 % ophthalmic solution Place 1 drop into both eyes 2 (two) times daily.      [name] 40-10-25 MG TABS Take 1 tablet by mouth every morning.        No current facility-administered medications for this visit.    OBJECTIVE: Elderly white woman in no acute distress Filed Vitals:   12/05/13 1300  BP: 148/77  Pulse: 66  Temp: 97.5 F (36.4 C)  Resp: 17     Body mass index is 20.02 kg/(m^2).    ECOG FS: 2  Sclerae unicteric; right eye is blind No cervical or supraclavicular adenopathy Lungs good excursion bilaterally, no rales or rhonchi Abd soft, nontender, positive bowel sounds MSK no peripheral edema, no focal spinal tenderness Neuro: non-focal, pleasant affect, well oriented Breasts: Status post remote lumpectomies bilaterally and never recent right lumpectomy. There is no  evidence of local recurrence on either side, and both axillae are benign. LAB RESULTS:  CMP     Component Value Date/Time   NA 142 12/05/2013 1313   NA 139 05/11/2013 1318   K 4.2 12/05/2013 1313   K 3.6 05/11/2013 1318   CL 99 05/11/2013 1318   CL 102 04/25/2013 1250   CO2 30* 12/05/2013 1313   CO2 29 05/11/2013 1318   GLUCOSE 124 12/05/2013 1313   GLUCOSE 142* 05/11/2013 1318   GLUCOSE 120* 04/25/2013 1250   BUN 21.2 12/05/2013 1313   BUN 22 05/11/2013 1318   CREATININE 0.8 12/05/2013 1313   CREATININE 0.75 05/11/2013  1318   CALCIUM 10.4 12/05/2013 1313   CALCIUM 10.6* 05/11/2013 1318   PROT 6.5 12/05/2013 1313   PROT 6.9 05/11/2013 1318   ALBUMIN 3.9 12/05/2013 1313   ALBUMIN 3.9 05/11/2013 1318   AST 26 12/05/2013 1313   AST 26 05/11/2013 1318   ALT 21 12/05/2013 1313   ALT 17 05/11/2013 1318   ALKPHOS 50 12/05/2013 1313   ALKPHOS 57 05/11/2013 1318   BILITOT 0.24 12/05/2013 1313   BILITOT 0.3 05/11/2013 1318   GFRNONAA 73* 05/11/2013 1318   GFRAA 85* 05/11/2013 1318    I No results found for this basename: SPEP,  UPEP,   kappa and lambda light chains    Lab Results  Component Value Date   WBC 7.4 12/05/2013   NEUTROABS 4.1 12/05/2013   HGB 12.2 12/05/2013   HCT 37.1 12/05/2013   MCV 93.9 12/05/2013   PLT 259 12/05/2013      Chemistry      Component Value Date/Time   NA 142 12/05/2013 1313   NA 139 05/11/2013 1318   K 4.2 12/05/2013 1313   K 3.6 05/11/2013 1318   CL 99 05/11/2013 1318   CL 102 04/25/2013 1250   CO2 30* 12/05/2013 1313   CO2 29 05/11/2013 1318   BUN 21.2 12/05/2013 1313   BUN 22 05/11/2013 1318   CREATININE 0.8 12/05/2013 1313   CREATININE 0.75 05/11/2013 1318      Component Value Date/Time   CALCIUM 10.4 12/05/2013 1313   CALCIUM 10.6* 05/11/2013 1318   ALKPHOS 50 12/05/2013 1313   ALKPHOS 57 05/11/2013 1318   AST 26 12/05/2013 1313   AST 26 05/11/2013 1318   ALT 21 12/05/2013 1313   ALT 17 05/11/2013 1318   BILITOT 0.24 12/05/2013 1313   BILITOT 0.3 05/11/2013 1318       No results found for this  basename: LABCA2    No components found with this basename: LABCA125    No results found for this basename: INR,  in the last 168 hours  Urinalysis    Component Value Date/Time   COLORURINE YELLOW 05/11/2013 [place] 05/11/2013 1317   LABSPEC 1.014 05/11/2013 1317   PHURINE 7.5 05/11/2013 1317   GLUCOSEU NEGATIVE 05/11/2013 1317   HGBUR NEGATIVE 05/11/2013 1317   BILIRUBINUR NEGATIVE 05/11/2013 1317   KETONESUR NEGATIVE 05/11/2013 1317   PROTEINUR NEGATIVE 05/11/2013 1317   UROBILINOGEN 1.0 05/11/2013 1317   NITRITE NEGATIVE 05/11/2013 1317   LEUKOCYTESUR NEGATIVE 05/11/2013 1317    STUDIES:  EXAM:  DIGITAL DIAGNOSTIC BILATERAL MAMMOGRAM WITH CAD  COMPARISON: With priors  ACR Breast Density Category c: The breasts are heterogeneously  dense, which may obscure small masses. Gas  FINDINGS:  There is no suspicious mass in either breast. Two punctate  calcifications are seen in the lateral aspect of the right breast.  They are felt to likely be benign.  Mammographic images were processed with CAD.  IMPRESSION:  Probable benign calcifications in the right breast.  RECOMMENDATION:  Diagnostic right mammogram in 6 months is recommended.  I have discussed the findings and recommendations with the patient.  Results were also provided in writing at the conclusion of the  visit. If applicable, a reminder letter will be sent to the patient  regarding the next appointment.  BI-RADS CATEGORY 3: Probably benign finding(s) - short interval  follow-up suggested.  Electronically Signed  By: Lillia Mountain M.D.  On: 09/10/2013 13:03  ASSESSMENT: 80 y.o. [place] woman  (1)  status post remote lumpectomies to both breasts, with adjuvant radiation bilaterally, and prior history of tamoxifen and Arimidex  (2) Status post right breast biopsy 04/18/2013 for ductal carcinoma in situ, high-grade, with an area of microinvasion; the invasive cancer was estrogen receptor positive at 100%,  progesterone receptor and HER-2 negative, with anMIB-1 of 2%.  (3)  status post right lumpectomy 05/21/2013 for a pT1b pnX, stage IA invasive ductal carcinoma, with negative margins(closest 1 mm from anterior margin)  (4) genetics testing discussed--patient and family prefer to forego genetic testing  (5) the patient has a 1 cm right lower lobe lung mass of unclear etiology, requiring followup.  PLAN: Josslynn is doing remarkably well and will turn 89 next month. She has no symptoms relating to her history of breast cancer or to her pulmonary nodule. We are going to obtain a chest x-ray today and again in a year the same day as the next visit. She is scheduled already for repeat mammography in May and to followup with Dr. Dalbert Batman in July.   I am delighted that she is doing so well. She knows to call for any problems that may develop before her next visit here.  Chauncey Cruel, MD   12/05/2013 2:07 PM

## 2013-12-11 ENCOUNTER — Ambulatory Visit: Payer: Medicare Other | Admitting: Oncology

## 2013-12-11 ENCOUNTER — Other Ambulatory Visit (HOSPITAL_COMMUNITY): Payer: Medicare Other

## 2013-12-11 ENCOUNTER — Other Ambulatory Visit: Payer: Medicare Other

## 2014-01-24 DIAGNOSIS — R071 Chest pain on breathing: Secondary | ICD-10-CM | POA: Diagnosis not present

## 2014-01-25 ENCOUNTER — Other Ambulatory Visit: Payer: Self-pay | Admitting: Family Medicine

## 2014-01-25 ENCOUNTER — Ambulatory Visit
Admission: RE | Admit: 2014-01-25 | Discharge: 2014-01-25 | Disposition: A | Discharge status: Home / Self Care | Payer: Medicare Other | Source: Ambulatory Visit | Attending: Family Medicine | Admitting: Family Medicine

## 2014-01-25 DIAGNOSIS — R0789 Other chest pain: Secondary | ICD-10-CM

## 2014-01-25 DIAGNOSIS — S2239XA Fracture of one rib, unspecified side, initial encounter for closed fracture: Secondary | ICD-10-CM | POA: Diagnosis not present

## 2014-02-11 ENCOUNTER — Other Ambulatory Visit: Payer: Self-pay | Admitting: Family Medicine

## 2014-02-11 DIAGNOSIS — Z853 Personal history of malignant neoplasm of breast: Secondary | ICD-10-CM

## 2014-02-11 DIAGNOSIS — R921 Mammographic calcification found on diagnostic imaging of breast: Secondary | ICD-10-CM

## 2014-02-25 DIAGNOSIS — I10 Essential (primary) hypertension: Secondary | ICD-10-CM | POA: Diagnosis not present

## 2014-02-25 DIAGNOSIS — Z23 Encounter for immunization: Secondary | ICD-10-CM | POA: Diagnosis not present

## 2014-02-25 DIAGNOSIS — E785 Hyperlipidemia, unspecified: Secondary | ICD-10-CM | POA: Diagnosis not present

## 2014-02-25 DIAGNOSIS — M810 Age-related osteoporosis without current pathological fracture: Secondary | ICD-10-CM | POA: Diagnosis not present

## 2014-02-25 DIAGNOSIS — R222 Localized swelling, mass and lump, trunk: Secondary | ICD-10-CM | POA: Diagnosis not present

## 2014-02-26 ENCOUNTER — Other Ambulatory Visit: Payer: Self-pay | Admitting: *Deleted

## 2014-02-26 ENCOUNTER — Other Ambulatory Visit: Payer: Self-pay | Admitting: Family Medicine

## 2014-02-26 DIAGNOSIS — R222 Localized swelling, mass and lump, trunk: Secondary | ICD-10-CM

## 2014-03-01 ENCOUNTER — Ambulatory Visit
Admission: RE | Admit: 2014-03-01 | Discharge: 2014-03-01 | Disposition: A | Discharge status: Home / Self Care | Payer: Medicare Other | Source: Ambulatory Visit | Attending: Family Medicine | Admitting: Family Medicine

## 2014-03-01 DIAGNOSIS — R222 Localized swelling, mass and lump, trunk: Secondary | ICD-10-CM

## 2014-03-01 DIAGNOSIS — R911 Solitary pulmonary nodule: Secondary | ICD-10-CM | POA: Diagnosis not present

## 2014-03-01 MED ORDER — IOHEXOL 300 MG/ML  SOLN
75.0000 mL | Freq: Once | INTRAMUSCULAR | Status: AC | PRN
  Administered 2014-03-01: 75 mL via INTRAVENOUS

## 2014-03-11 ENCOUNTER — Ambulatory Visit
Admission: RE | Admit: 2014-03-11 | Discharge: 2014-03-11 | Disposition: A | Discharge status: Home / Self Care | Payer: Medicare Other | Source: Ambulatory Visit | Attending: Family Medicine | Admitting: Family Medicine

## 2014-03-11 DIAGNOSIS — R928 Other abnormal and inconclusive findings on diagnostic imaging of breast: Secondary | ICD-10-CM | POA: Diagnosis not present

## 2014-03-11 DIAGNOSIS — R921 Mammographic calcification found on diagnostic imaging of breast: Secondary | ICD-10-CM

## 2014-03-11 DIAGNOSIS — Z853 Personal history of malignant neoplasm of breast: Secondary | ICD-10-CM

## 2014-03-13 DIAGNOSIS — T148XXA Other injury of unspecified body region, initial encounter: Secondary | ICD-10-CM | POA: Diagnosis not present

## 2014-03-13 DIAGNOSIS — S2239XA Fracture of one rib, unspecified side, initial encounter for closed fracture: Secondary | ICD-10-CM | POA: Diagnosis not present

## 2014-03-14 DIAGNOSIS — M810 Age-related osteoporosis without current pathological fracture: Secondary | ICD-10-CM | POA: Diagnosis not present

## 2014-03-15 ENCOUNTER — Other Ambulatory Visit (HOSPITAL_COMMUNITY): Payer: Self-pay | Admitting: Family Medicine

## 2014-03-15 ENCOUNTER — Other Ambulatory Visit: Payer: Self-pay | Admitting: Radiology

## 2014-03-15 DIAGNOSIS — T148XXA Other injury of unspecified body region, initial encounter: Secondary | ICD-10-CM

## 2014-03-19 ENCOUNTER — Other Ambulatory Visit (HOSPITAL_COMMUNITY): Payer: Self-pay | Admitting: Family Medicine

## 2014-03-19 ENCOUNTER — Ambulatory Visit (HOSPITAL_COMMUNITY)
Admission: RE | Admit: 2014-03-19 | Discharge: 2014-03-19 | Disposition: A | Discharge status: Home / Self Care | Payer: Medicare Other | Source: Ambulatory Visit | Attending: Family Medicine | Admitting: Family Medicine

## 2014-03-19 DIAGNOSIS — J90 Pleural effusion, not elsewhere classified: Secondary | ICD-10-CM | POA: Diagnosis not present

## 2014-03-19 DIAGNOSIS — R899 Unspecified abnormal finding in specimens from other organs, systems and tissues: Secondary | ICD-10-CM | POA: Diagnosis not present

## 2014-03-19 DIAGNOSIS — T148XXA Other injury of unspecified body region, initial encounter: Secondary | ICD-10-CM

## 2014-03-19 DIAGNOSIS — R222 Localized swelling, mass and lump, trunk: Secondary | ICD-10-CM | POA: Insufficient documentation

## 2014-03-19 DIAGNOSIS — R609 Edema, unspecified: Secondary | ICD-10-CM | POA: Diagnosis not present

## 2014-03-19 NOTE — Procedures (Signed)
Interventional Radiology Procedure Note  Procedure: US aspiration of complex fluid collection overlying the left scapula.  170 mL thin serosanguinous fluid aspirated.  Fluid appearance most c/w liquified hematoma.  Complications: None Recommendations: - Fluid sent for gram stain and culture  Signed,  Criselda Peaches, MD [facility]

## 2014-03-22 LAB — BODY FLUID CULTURE: Culture: NO GROWTH

## 2014-04-04 DIAGNOSIS — H4010X0 Unspecified open-angle glaucoma, stage unspecified: Secondary | ICD-10-CM | POA: Diagnosis not present

## 2014-04-04 DIAGNOSIS — D1809 Hemangioma of other sites: Secondary | ICD-10-CM | POA: Diagnosis not present

## 2014-04-04 DIAGNOSIS — H353290 Exudative age-related macular degeneration, unspecified eye, stage unspecified: Secondary | ICD-10-CM | POA: Diagnosis not present

## 2014-04-04 DIAGNOSIS — H31019 Macula scars of posterior pole (postinflammatory) (post-traumatic), unspecified eye: Secondary | ICD-10-CM | POA: Diagnosis not present

## 2014-04-04 DIAGNOSIS — H409 Unspecified glaucoma: Secondary | ICD-10-CM | POA: Diagnosis not present

## 2014-04-04 DIAGNOSIS — H18429 Band keratopathy, unspecified eye: Secondary | ICD-10-CM | POA: Diagnosis not present

## 2014-04-24 ENCOUNTER — Encounter: Payer: Medicare Other | Admitting: Surgery

## 2014-04-24 ENCOUNTER — Other Ambulatory Visit: Payer: Self-pay | Admitting: Surgery

## 2014-04-24 DIAGNOSIS — C50411 Malignant neoplasm of upper-outer quadrant of right female breast: Secondary | ICD-10-CM

## 2014-04-26 ENCOUNTER — Encounter: Payer: Self-pay | Admitting: Surgery

## 2014-04-26 ENCOUNTER — Encounter (INDEPENDENT_AMBULATORY_CARE_PROVIDER_SITE_OTHER): Payer: Self-pay

## 2014-04-26 ENCOUNTER — Ambulatory Visit
Admission: RE | Admit: 2014-04-26 | Discharge: 2014-04-26 | Disposition: A | Discharge status: Home / Self Care | Payer: Medicare Other | Source: Ambulatory Visit | Attending: Surgery | Admitting: Surgery

## 2014-04-26 ENCOUNTER — Institutional Professional Consult (permissible substitution) (INDEPENDENT_AMBULATORY_CARE_PROVIDER_SITE_OTHER): Payer: Medicare Other | Admitting: Surgery

## 2014-04-26 VITALS — BP 126/70 | HR 65 | Resp 16 | Ht 65.0 in | Wt 118.0 lb

## 2014-04-26 DIAGNOSIS — S2249XA Multiple fractures of ribs, unspecified side, initial encounter for closed fracture: Secondary | ICD-10-CM | POA: Insufficient documentation

## 2014-04-26 DIAGNOSIS — S2239XA Fracture of one rib, unspecified side, initial encounter for closed fracture: Secondary | ICD-10-CM | POA: Insufficient documentation

## 2014-04-26 DIAGNOSIS — S20212A Contusion of left front wall of thorax, initial encounter: Secondary | ICD-10-CM

## 2014-04-26 DIAGNOSIS — J449 Chronic obstructive pulmonary disease, unspecified: Secondary | ICD-10-CM | POA: Diagnosis not present

## 2014-04-26 DIAGNOSIS — S20219A Contusion of unspecified front wall of thorax, initial encounter: Secondary | ICD-10-CM | POA: Insufficient documentation

## 2014-04-26 DIAGNOSIS — S2232XA Fracture of one rib, left side, initial encounter for closed fracture: Secondary | ICD-10-CM

## 2014-04-26 DIAGNOSIS — C50919 Malignant neoplasm of unspecified site of unspecified female breast: Secondary | ICD-10-CM | POA: Diagnosis not present

## 2014-04-26 DIAGNOSIS — C50411 Malignant neoplasm of upper-outer quadrant of right female breast: Secondary | ICD-10-CM

## 2014-04-26 NOTE — Progress Notes (Signed)
Cardiothoracic Surgery Consultation   PCP is Vidal Schwalbe, MD Referring Provider is Vidal Schwalbe, MD  Chief Complaint  Patient presents with  . Fall    resulting in rib fractures and persistent hematoma left posterior chest    HPI:  The patient is an 80 year old woman who reports developing pain in the left back in late March after rolling over in bed. She subsequently developed a mass over this area and xray of the ribs on 01/25/2014 showed left seventh and eighth posterior rib fractures with the seventh appearing old and stable since a prior xray on 12/05/2013. The eighth rib fracture appeared new. A CT scan of the chest showed that the mass appeared to be a fluid collection like a hematoma over the fracture sites. An US guided aspiration yielded 170 cc of bloody fluid and culture and cytology were negative. The fluid collection decreased in size after aspiration but has increased in size since. It is not causing any pain. Past Medical History  Diagnosis Date  . Hypertension   . Urinary incontinence   . Depression   . Anxiety   . 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 Heart murmur   . Cancer     breast, skin  . Anemia     Past Surgical History  Procedure Laterality Date  . Breast lumpectomy Bilateral     left 1991,right side 1994  . Abdominal hysterectomy    . Back surgery    . Eye surgery Bilateral     cat  . Tonsillectomy    . Partial mastectomy with needle localization Right 05/21/2013    Procedure: PARTIAL MASTECTOMY WITH NEEDLE LOCALIZATION;  Surgeon: Adin Hector, MD;  Location: [facility];  Service: General;  Laterality: Right;    History reviewed. No pertinent family history.  Social History History  Substance Use Topics  . Smoking status: Never Smoker   . Smokeless tobacco: Not on file  . Alcohol Use: No    Current Outpatient Prescriptions  Medication Sig Dispense Refill  . alendronate (FOSAMAX) 70 MG tablet Take 70 mg by mouth  every 7 (seven) days.       Marland Kitchen FLUoxetine (PROZAC) 20 MG capsule Take 20 mg by mouth daily.       . hydrALAZINE (APRESOLINE) 25 MG tablet Take 25-50 mg by mouth 4 (four) times daily. 50 mg in the a.m. , 25 mg at noon, 50 mg at dinner, and 25 mg at bedtime      . mirtazapine (REMERON) 30 MG tablet Take 30 mg by mouth at bedtime.       . Multiple Vitamins-Minerals (ICAPS MV PO) Take by mouth 1 day or 1 dose.      . potassium chloride (KLOR-CON) 8 MEQ tablet Take 16 mEq by mouth 2 (two) times daily.      . pravastatin (PRAVACHOL) 40 MG tablet Take 40 mg by mouth daily.      . timolol (TIMOPTIC) 0.5 % ophthalmic solution Place 1 drop into both eyes 2 (two) times daily.      [name] 40-10-25 MG TABS Take 1 tablet by mouth every morning.        No current facility-administered medications for this visit.    Allergies  Allergen Reactions  . Aspirin     Bleeding / rash  . Emete-Con [Benzquinamide]     unknown    Review of Systems  Constitutional: Negative for fever, chills  and unexpected weight change.  HENT: Negative.   Eyes:       Legally blind  Respiratory: Negative for cough, chest tightness and shortness of breath.   Cardiovascular: Negative for chest pain.  Gastrointestinal: Negative.   Musculoskeletal: Positive for back pain.  Skin: Negative.   Neurological: Negative.     BP 126/70  Pulse 65  Resp 16  Ht 5\' 5"  (1.651 m)  Wt 118 lb (53.524 kg)  BMI 19.64 kg/m2  SpO2 98% Physical Exam  Constitutional: She appears well-developed and well-nourished.  Elderly woman in no distress   Neck: Normal range of motion. Neck supple. No thyromegaly present.  Cardiovascular: Normal rate, regular rhythm and normal heart sounds.   No murmur heard. Pulmonary/Chest: Effort normal and breath sounds normal. No respiratory distress. She has no rales. She exhibits no tenderness.  Moderately large seroma over the left posterior back that is compressible with free-flowing fluid. No sign of  infection and no tenderness.  Abdominal: Soft. Bowel sounds are normal. She exhibits no distension. There is no tenderness.  Lymphadenopathy:    She has no cervical adenopathy.  Skin: Skin is warm and dry. No erythema.     Diagnostic Tests:  CLINICAL DATA: Soft tissue mass in left upper back  EXAM:  CT CHEST WITH CONTRAST  TECHNIQUE:  Multidetector CT imaging of the chest was performed during  intravenous contrast administration.  CONTRAST: 37mL OMNIPAQUE IOHEXOL 300 MG/ML SOLN  COMPARISON: None.  FINDINGS:  No pleural effusion identified. No airspace consolidation or  atelectasis. Stable 8 mm right lower lobe lung nodule, image  24/series 3. No new pulmonary nodules are mass is noted.  Trachea is patent and appears midline. There is mild cardiac  enlargement. No pericardial effusion. There is calcified  atherosclerotic disease involving the thoracic aorta. No  pathologically enlarged mediastinal or hilar lymph nodes identified.  No pathologically enlarged axillary or supraclavicular adenopathy.  Surgical clips in the right axilla are noted has are clips in the  left breast. Areas of dystrophic calcification are identified within  the right breast parenchyma.  Incidental imaging through the upper abdomen shows a stable low  attenuation structure within the lateral segment of left hepatic  lobe which measures 7 mm, image 47/series 2. No acute findings are  identified within the upper abdomen.  Review of the visualized osseous structures shows subacute to  chronic left seventh and eighth rib fractures. Within the left  posterior chest wall there is an associated intermediate to low  attenuation mass which measures 6.2 x 3.4 x 7.8 cm. This measures 20  Hounsfield units, image 35/series 2.  IMPRESSION:  1. Intermediate to the low-attenuation mass within the left  posterior chest wall accounts for the large lump and left upper  back. Adjacent subacute to chronic rib fractures are  identified. The  mass is favored to represent hematoma. If clinically indicated, this  could be aspirated under ultrasound guidance.  2. Stable 8 mm right lower lobe nodule. This is been stable for 10  months. Continued followup is indicated. The next followup  examination should be obtained in 18-24 months. This recommendation  follows the consensus statement: Guidelines for Management of Small  Pulmonary Nodules Detected on CT Scans: A Statement from the  [place] as published in Radiology 2005; 237:395-400. Marland Kitchen  Electronically Signed  By: Kerby Moors M.D.  On: 03/01/2014 14:28    CLINICAL DATA: 80 year old female with a several month history of  complex fluid collection in the superficial  soft tissues posterior  to the left scapula. This is associated with several left-sided rib  fractures and may represent a hematoma. Ultrasound-guided aspiration  is required both for therapeutic and diagnostic purposes.  EXAM:  US ASPIRATION  Date: 03/19/2014  PROCEDURE:  1. Ultrasound-guided aspiration of subcutaneous fluid collection  Interventional Radiologist: Criselda Peaches, MD  ANESTHESIA/SEDATION:  None required  TECHNIQUE:  Informed consent was obtained from the patient following explanation  of the procedure, risks, benefits and alternatives. The patient  understands, agrees and consents for the procedure. All questions  were addressed. A time out was performed.  The left posterior thorax was interrogated with ultrasound. There is  a large superficial mildly complex fluid collection overlying the  scapula. A suitable skin entry site was selected and marked. The  region was sterilely prepped and draped in the usual fashion using  Betadine skin prep. Local anesthesia was attained by infiltration  with 1% lidocaine. A small dermatotomy was made. Under real-time  sonographic guidance, a 5 [place] Yueh centesis catheter was advanced  into the fluid collection.  Approximately 170 mL of bloody fluid was  aspirated nearly completely resolving the fluid collection.  The material was divided and sent for Gram stain/culture and  cytology. The patient tolerated the procedure well. There was no  immediate complication.  IMPRESSION:  Successful aspiration of superficial complex fluid collection  overlying the left scapula. 170 mL of thin bloody fluid was  aspirated. The appearance of the fluid is most consistent with  liquified hematoma.  Material was sent for both Gram stain/culture and cytology.  Signed,  Criselda Peaches, MD  [facility]  [facility]  Electronically Signed  By: Jacqulynn Cadet M.D.  On: 03/19/2014 18:51   Impression:  She has an asymptomatic seroma over the left posterior back which probably started out as a hematoma with influx of serous fluid. I would recommend leaving this alone. If it is drained with a needle as before it will recur and will probably end up getting infected due to skin contamination. Then this will turn into a large abscess. Hopefully this will gradually resolve on its own. If it does get infected it will required surgical drainage which could be a long course in this elderly patient.   Plan:  She is in agreement with continuing to observe this since it is asymptomatic. She will contact me if she develops any tenderness, redness, fever, or drainage from the area.

## 2014-05-02 ENCOUNTER — Encounter (HOSPITAL_COMMUNITY): Payer: Self-pay | Admitting: Emergency Medicine

## 2014-05-02 ENCOUNTER — Emergency Department (HOSPITAL_COMMUNITY)
Admission: EM | Admit: 2014-05-02 | Discharge: 2014-05-02 | Disposition: A | Discharge status: Home / Self Care | Payer: Medicare Other | Attending: Emergency Medicine | Admitting: Emergency Medicine

## 2014-05-02 ENCOUNTER — Emergency Department (HOSPITAL_COMMUNITY): Payer: Medicare Other

## 2014-05-02 DIAGNOSIS — Z862 Personal history of diseases of the blood and blood-forming organs and certain disorders involving the immune mechanism: Secondary | ICD-10-CM | POA: Diagnosis not present

## 2014-05-02 DIAGNOSIS — F329 Major depressive disorder, single episode, unspecified: Secondary | ICD-10-CM | POA: Insufficient documentation

## 2014-05-02 DIAGNOSIS — Y939 Activity, unspecified: Secondary | ICD-10-CM | POA: Insufficient documentation

## 2014-05-02 DIAGNOSIS — M25551 Pain in right hip: Secondary | ICD-10-CM

## 2014-05-02 DIAGNOSIS — S79919A Unspecified injury of unspecified hip, initial encounter: Secondary | ICD-10-CM | POA: Insufficient documentation

## 2014-05-02 DIAGNOSIS — Z853 Personal history of malignant neoplasm of breast: Secondary | ICD-10-CM | POA: Insufficient documentation

## 2014-05-02 DIAGNOSIS — R011 Cardiac murmur, unspecified: Secondary | ICD-10-CM | POA: Insufficient documentation

## 2014-05-02 DIAGNOSIS — F3289 Other specified depressive episodes: Secondary | ICD-10-CM | POA: Diagnosis not present

## 2014-05-02 DIAGNOSIS — Y929 Unspecified place or not applicable: Secondary | ICD-10-CM | POA: Insufficient documentation

## 2014-05-02 DIAGNOSIS — M25559 Pain in unspecified hip: Secondary | ICD-10-CM | POA: Diagnosis not present

## 2014-05-02 DIAGNOSIS — I10 Essential (primary) hypertension: Secondary | ICD-10-CM | POA: Diagnosis not present

## 2014-05-02 DIAGNOSIS — F411 Generalized anxiety disorder: Secondary | ICD-10-CM | POA: Insufficient documentation

## 2014-05-02 DIAGNOSIS — M1610 Unilateral primary osteoarthritis, unspecified hip: Secondary | ICD-10-CM | POA: Diagnosis not present

## 2014-05-02 DIAGNOSIS — Z79899 Other long term (current) drug therapy: Secondary | ICD-10-CM | POA: Insufficient documentation

## 2014-05-02 DIAGNOSIS — M169 Osteoarthritis of hip, unspecified: Secondary | ICD-10-CM | POA: Diagnosis not present

## 2014-05-02 DIAGNOSIS — H548 Legal blindness, as defined in USA: Secondary | ICD-10-CM | POA: Diagnosis not present

## 2014-05-02 DIAGNOSIS — R296 Repeated falls: Secondary | ICD-10-CM | POA: Insufficient documentation

## 2014-05-02 DIAGNOSIS — S79929A Unspecified injury of unspecified thigh, initial encounter: Principal | ICD-10-CM

## 2014-05-02 MED ORDER — TRAMADOL HCL 50 MG PO TABS
50.0000 mg | ORAL_TABLET | Freq: Once | ORAL | Status: AC
  Administered 2014-05-02: 50 mg via ORAL
  Filled 2014-05-02: qty 1

## 2014-05-02 NOTE — ED Provider Notes (Signed)
CSN: [id number]     Arrival date & time 05/02/14  1039 History   First MD Initiated Contact with Patient 05/02/14 1048     Chief Complaint  Patient presents with  . Hip Pain    right     (Consider location/radiation/quality/duration/timing/severity/associated sxs/prior Treatment) Patient is a 80 y.o. female presenting with hip pain. The history is provided by the patient.  Hip Pain Pertinent negatives include no chest pain, no abdominal pain, no headaches and no shortness of breath.  pt c/o stumble at fall last pm at home.  States felt a catch in right hip, fell onto right hip. Pain constant, dull, moderate, non radiating. No radicular pain. No numbness/weakness. No faintness or dizziness prior to fall. No head injury or headache. No nv. No neck or back pain. Denies any other pain or injury. No fever or chills. No gu c/o.     Past Medical History  Diagnosis Date  . Hypertension   . Urinary incontinence   . Depression   . Anxiety   . 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 Heart murmur   . Cancer     breast, skin  . Anemia    Past Surgical History  Procedure Laterality Date  . Breast lumpectomy Bilateral     left 1991,right side 1994  . Abdominal hysterectomy    . Back surgery    . Eye surgery Bilateral     cat  . Tonsillectomy    . Partial mastectomy with needle localization Right 05/21/2013    Procedure: PARTIAL MASTECTOMY WITH NEEDLE LOCALIZATION;  Surgeon: Adin Hector, MD;  Location: [facility];  Service: General;  Laterality: Right;   No family history on file. History  Substance Use Topics  . Smoking status: Never Smoker   . Smokeless tobacco: Not on file  . Alcohol Use: No   OB History   Grav Para Term Preterm Abortions TAB SAB Ect Mult Living                 Review of Systems  Constitutional: Negative for fever and chills.  HENT: Negative for sore throat.   Eyes: Negative for pain and visual disturbance.  Respiratory: Negative for  shortness of breath.   Cardiovascular: Negative for chest pain.  Gastrointestinal: Negative for vomiting, abdominal pain and diarrhea.  Genitourinary: Negative for flank pain.  Musculoskeletal: Negative for back pain and neck pain.  Skin: Negative for rash.  Neurological: Negative for weakness, numbness and headaches.  Hematological: Does not bruise/bleed easily.  Psychiatric/Behavioral: Negative for confusion.      Allergies  Aspirin and Emete-con  Home Medications   Prior to Admission medications   Medication Sig Start Date End Date Taking? Authorizing Provider  alendronate (FOSAMAX) 70 MG tablet Take 70 mg by mouth every 7 (seven) days.  02/23/13   Historical Provider, MD  FLUoxetine (PROZAC) 20 MG capsule Take 20 mg by mouth daily.  04/19/13   Historical Provider, MD  hydrALAZINE (APRESOLINE) 25 MG tablet Take 25-50 mg by mouth 4 (four) times daily. 50 mg in the a.m. , 25 mg at noon, 50 mg at dinner, and 25 mg at bedtime    Historical Provider, MD  mirtazapine (REMERON) 30 MG tablet Take 30 mg by mouth at bedtime.  01/24/13   Historical Provider, MD  Multiple Vitamins-Minerals (ICAPS MV PO) Take by mouth 1 day or 1 dose.    Historical Provider, MD  potassium chloride (KLOR-CON) 8  MEQ tablet Take 16 mEq by mouth 2 (two) times daily.    Historical Provider, MD  pravastatin (PRAVACHOL) 40 MG tablet Take 40 mg by mouth daily.    Historical Provider, MD  timolol (TIMOPTIC) 0.5 % ophthalmic solution Place 1 drop into both eyes 2 (two) times daily.    Historical Provider, MD  TRIBENZOR 40-10-25 MG TABS Take 1 tablet by mouth every morning.  01/26/13   Historical Provider, MD   BP 168/61  Pulse 55  Temp(Src) 98.4 F (36.9 C) (Oral)  Resp 18  SpO2 100% Physical Exam  Nursing note and vitals reviewed. Constitutional: She is oriented to person, place, and time. She appears well-developed and well-nourished. No distress.  HENT:  Head: Atraumatic.  Eyes: Conjunctivae are normal. Pupils  are equal, round, and reactive to light. No scleral icterus.  Neck: Neck supple. No tracheal deviation present.  Cardiovascular: Normal rate, regular rhythm, normal heart sounds and intact distal pulses.   Pulmonary/Chest: Effort normal and breath sounds normal. No respiratory distress. She exhibits no tenderness.  Abdominal: Soft. Normal appearance. She exhibits no distension. There is no tenderness.  Genitourinary:  No cva tenderness  Musculoskeletal: Normal range of motion. She exhibits tenderness. She exhibits no edema.  CTLS spine, non tender, aligned, no step off. Right hip tenderness. No deformity noted. Distal pulses palp.     Neurological: She is alert and oriented to person, place, and time.  Motor intact bil. Ambulates w cane.   Skin: Skin is warm and dry. No rash noted.  Psychiatric: She has a normal mood and affect.    ED Course  Procedures (including critical care time) Labs Review    Dg Hip Complete Right  05/02/2014   CLINICAL DATA:  Lateral right hip pain for a few days.  No injury.  EXAM: RIGHT HIP - COMPLETE 2+ VIEW  COMPARISON:  None.  FINDINGS: There is no evidence of hip fracture or dislocation. Degenerative joint changes of bilateral hips with narrowed joint space and osteophyte formation are noted. Marked stool burden is identified within the visualized colon.  IMPRESSION: No acute fracture or dislocation. Degenerative joint changes of bilateral hips. Marked stool burden identified in visualized colon.   Electronically Signed   By: Abelardo Diesel M.D.   On: 05/02/2014 12:37     MDM  Xrays.  Ultram po.  Reviewed nursing notes and prior charts for additional history.    Recheck, pt ambulatory about ED.  Pt states has some ultram left at home which she can take as need for pain.   Discussed xrays w pt, pt appears stable for d/c.     Mirna Mires, MD 05/02/14 1336

## 2014-05-02 NOTE — ED Notes (Addendum)
Pt reports right hip pain for a few days. Pt reports hip "catching" and fell yesterday. Pt denies LOC, CP, or SOB.   Upon assessment swelling noted over left scapula area. Pt reports PCP aware and has been drained once. Pt reports it was diagnosed as hematoma to chest wall post rib fracture. Steinl MD made aware.

## 2014-05-02 NOTE — Discharge Instructions (Signed)
Take your ultram as need for pain. Follow up with your primary care doctor in 1 week if symptoms fail to improve/resolve. Return to ER if worse, new symptoms, weak/faint, severe pain, fevers, other concern.   Hip Pain The hips join the upper legs to the lower pelvis. The bones, cartilage, tendons, and muscles of the hip joint perform a lot of work each day holding your body weight and allowing you to move around. Hip pain is a common symptom. It can range from a minor ache to severe pain on 1 or both hips. Pain may be felt on the inside of the hip joint near the groin, or the outside near the buttocks and upper thigh. There may be swelling or stiffness as well. It occurs more often when a person walks or performs activity. There are many reasons hip pain can develop. CAUSES  It is important to work with your caregiver to identify the cause since many conditions can impact the bones, cartilage, muscles, and tendons of the hips. Causes for hip pain include:  Broken (fractured) bones.  Separation of the thighbone from the hip socket (dislocation).  Torn cartilage of the hip joint.  Swelling (inflammation) of a tendon (tendonitis), the sac within the hip joint (bursitis), or a joint.  A weakening in the abdominal wall (hernia), affecting the nerves to the hip.  Arthritis in the hip joint or lining of the hip joint.  Pinched nerves in the back, hip, or upper thigh.  A bulging disc in the spine (herniated disc).  Rarely, bone infection or cancer. DIAGNOSIS  The location of your hip pain will help your caregiver understand what may be causing the pain. A diagnosis is based on your medical history, your symptoms, results from your physical exam, and results from diagnostic tests. Diagnostic tests may include X-ray exams, a computerized magnetic scan (magnetic resonance imaging, MRI), or bone scan. TREATMENT  Treatment will depend on the cause of your hip pain. Treatment may  include:  Limiting activities and resting until symptoms improve.  Crutches or other walking supports (a cane or brace).  Ice, elevation, and compression.  Physical therapy or home exercises.  Shoe inserts or special shoes.  Losing weight.  Medications to reduce pain.  Undergoing surgery. HOME CARE INSTRUCTIONS   Only take over-the-counter or prescription medicines for pain, discomfort, or fever as directed by your caregiver.  Put ice on the injured area:  Put ice in a plastic bag.  Place a towel between your skin and the bag.  Leave the ice on for 15-20 minutes at a time, 03-04 times a day.  Keep your leg raised (elevated) when possible to lessen swelling.  Avoid activities that cause pain.  Follow specific exercises as directed by your caregiver.  Sleep with a pillow between your legs on your most comfortable side.  Record how often you have hip pain, the location of the pain, and what it feels like. This information may be helpful to you and your caregiver.  Ask your caregiver about returning to work or sports and whether you should drive.  Follow up with your caregiver for further exams, therapy, or testing as directed. SEEK MEDICAL CARE IF:   Your pain or swelling continues or worsens after 1 week.  You are feeling unwell or have chills.  You have increasing difficulty with walking.  You have a loss of sensation or other new symptoms.  You have questions or concerns. SEEK IMMEDIATE MEDICAL CARE IF:   You cannot  put weight on the affected hip.  You have fallen.  You have a sudden increase in pain and swelling in your hip.  You have a fever. MAKE SURE YOU:   Understand these instructions.  Will watch your condition.  Will get help right away if you are not doing well or get worse. Document Released: 04/07/2010 Document Revised: 01/10/2012 Document Reviewed: 04/07/2010 [facility] Patient Information 2015 [place], [place]. This information is not  intended to replace advice given to you by your health care provider. Make sure you discuss any questions you have with your health care provider.

## 2014-05-02 NOTE — ED Notes (Signed)
Pt states that she fell last night and c/o right hip pain. Pt denies hitting her head, LOC, or taking blood thinners. Pt states that couple days before she fell she was having some discomfort in her right hip.

## 2014-05-02 NOTE — ED Notes (Signed)
Pt transported to xray 

## 2014-05-14 DIAGNOSIS — S298XXA Other specified injuries of thorax, initial encounter: Secondary | ICD-10-CM | POA: Diagnosis not present

## 2014-05-14 DIAGNOSIS — S2239XA Fracture of one rib, unspecified side, initial encounter for closed fracture: Secondary | ICD-10-CM | POA: Diagnosis not present

## 2014-05-17 ENCOUNTER — Encounter: Payer: Self-pay | Admitting: Surgery

## 2014-05-17 ENCOUNTER — Ambulatory Visit (INDEPENDENT_AMBULATORY_CARE_PROVIDER_SITE_OTHER): Payer: Medicare Other | Admitting: Surgery

## 2014-05-17 VITALS — BP 156/78 | HR 66 | Resp 16 | Ht 65.0 in | Wt 118.0 lb

## 2014-05-17 DIAGNOSIS — S20212D Contusion of left front wall of thorax, subsequent encounter: Secondary | ICD-10-CM

## 2014-05-17 DIAGNOSIS — S20219A Contusion of unspecified front wall of thorax, initial encounter: Secondary | ICD-10-CM | POA: Diagnosis not present

## 2014-05-17 DIAGNOSIS — Z5189 Encounter for other specified aftercare: Secondary | ICD-10-CM | POA: Diagnosis not present

## 2014-05-17 NOTE — Progress Notes (Signed)
HPI:  The patient returns for reevaluation of a seroma over the left posterior back with underlying rib fractures. She reports developing pain in the left back in late March after rolling over in bed. She subsequently developed a mass over this area and xray of the ribs on 01/25/2014 showed left seventh and eighth posterior rib fractures with the seventh appearing old and stable since a prior xray on 12/05/2013. The old seventh rib fracture was present on CXR last July 2014. The eighth rib fracture appeared new. A CT scan of the chest showed that the mass appeared to be a fluid collection like a hematoma over the fracture sites. An US guided aspiration yielded 170 cc of bloody fluid and culture and cytology were negative. The fluid collection decreased in size after aspiration but has increased in size since. When I saw her on 04/26/2014 she was not complaining of any pain and we decided to treat this conservatively as long as there were no signs of infection. She was recently seen in the ER for right hip pain after a fall but xray was negative for hip fracture. She says she was recently seen in [facility] and reported pain over this area of the left back and was placed on Tramadol which seems to be helping. She does notice some movement sensation in the ribs when using her left arm.   Current Outpatient Prescriptions  Medication Sig Dispense Refill  . acetaminophen (TYLENOL) 500 MG tablet Take 500 mg by mouth every 6 (six) hours as needed for moderate pain.      Marland Kitchen alendronate (FOSAMAX) 70 MG tablet Take 70 mg by mouth every 7 (seven) days.       Marland Kitchen FLUoxetine (PROZAC) 20 MG capsule Take 20 mg by mouth daily.       . hydrALAZINE (APRESOLINE) 25 MG tablet Take 25-50 mg by mouth 4 (four) times daily. 50 mg in the a.m. , 25 mg at noon, 50 mg at dinner, and 25 mg at bedtime      . mirtazapine (REMERON) 30 MG tablet Take 30 mg by mouth at bedtime.       . Multiple Vitamins-Minerals (ICAPS MV PO) Take  by mouth 1 day or 1 dose.      . potassium chloride (KLOR-CON) 8 MEQ tablet Take 16 mEq by mouth 2 (two) times daily.      . pravastatin (PRAVACHOL) 40 MG tablet Take 40 mg by mouth at bedtime.       . prednisoLONE acetate (PRED FORTE) 1 % ophthalmic suspension Place 1 drop into the right eye 2 (two) times daily.      . timolol (TIMOPTIC) 0.5 % ophthalmic solution Place 1 drop into both eyes 2 (two) times daily.      . traMADol (ULTRAM) 50 MG tablet Take 50 mg by mouth every 6 (six) hours as needed.      [name] 40-10-25 MG TABS Take 1 tablet by mouth every morning.        No current facility-administered medications for this visit.     Physical Exam: BP 156/78  Pulse 66  Resp 16  Ht 5\' 5"  (1.651 m)  Wt 118 lb (53.524 kg)  BMI 19.64 kg/m2  SpO2 98% She looks comfortable Lung exam is clear The fluid collection over the left back is soft and nontender and seems a little smaller to me. There is minimal tenderness to palpation of the chest wall beneath it.  Diagnostic Tests: None  today  Impression:  She has a chronic left 7th rib fracture that is at least a year old and a more recent left 8th rib fracture that happened between February and March of 2015. I think she got some surrounding hematoma and this led to a seroma which persists. I would expect this to resolve on its own over time. The rib fractures are displaced and have probably formed fibrous non-unions. She may have pain from this for up to a year and may feel some motion in these ribs with acivity. There is no treatment for this except pain meds as needed. This seroma may take a long time to resolve and may always have some fluid in it but rarely causes any long term problems. Her rib fractures can cause pain for a year or more. I discussed this in detail with the patient and her son [name] by phone.  Plan:  She will use the Ultram as needed for pain.  She will follow up with her primary physician  She does have an 8 mm  nodule in the right lower lobe that has been stable for 10 months but will require a follow up scan in 1 year. This can be followed up by her primary physician.

## 2014-05-31 DIAGNOSIS — I10 Essential (primary) hypertension: Secondary | ICD-10-CM | POA: Diagnosis not present

## 2014-06-04 DIAGNOSIS — T148XXA Other injury of unspecified body region, initial encounter: Secondary | ICD-10-CM | POA: Diagnosis not present

## 2014-06-04 DIAGNOSIS — I10 Essential (primary) hypertension: Secondary | ICD-10-CM | POA: Diagnosis not present

## 2014-06-06 ENCOUNTER — Telehealth: Payer: Self-pay | Admitting: *Deleted

## 2014-06-06 NOTE — Telephone Encounter (Signed)
Alejandra Grant called stating the Tramadol is not taking care of her pain anymore.  The pain mostly occurs with movement.  I reassured her regarding her last visit with Dr. Cyndia Bent. He told her it could take up to a year before she could see a change.  I suggested she call hr PCP since he was the one that had initially presrcibed the Tramadol and she agreed.

## 2014-06-10 DIAGNOSIS — I10 Essential (primary) hypertension: Secondary | ICD-10-CM | POA: Diagnosis not present

## 2014-06-10 DIAGNOSIS — N289 Disorder of kidney and ureter, unspecified: Secondary | ICD-10-CM | POA: Diagnosis not present

## 2014-06-10 DIAGNOSIS — S20219A Contusion of unspecified front wall of thorax, initial encounter: Secondary | ICD-10-CM | POA: Diagnosis not present

## 2014-06-17 ENCOUNTER — Ambulatory Visit (INDEPENDENT_AMBULATORY_CARE_PROVIDER_SITE_OTHER): Payer: Medicare Other | Admitting: General Surgery

## 2014-06-17 ENCOUNTER — Encounter (INDEPENDENT_AMBULATORY_CARE_PROVIDER_SITE_OTHER): Payer: Self-pay | Admitting: General Surgery

## 2014-06-17 VITALS — BP 200/80 | HR 65 | Temp 97.5°F | Ht 65.0 in | Wt 118.0 lb

## 2014-06-17 DIAGNOSIS — C50411 Malignant neoplasm of upper-outer quadrant of right female breast: Secondary | ICD-10-CM

## 2014-06-17 DIAGNOSIS — C50419 Malignant neoplasm of upper-outer quadrant of unspecified female breast: Secondary | ICD-10-CM | POA: Diagnosis not present

## 2014-06-17 NOTE — Patient Instructions (Signed)
Examination of her breasts and all the regional lymph nodes today is normal. The breasts are lumpy but there is no evidence of cancer.  The mammograms of your right breast on 03/11/2014 show stable calcifications on the right.  Be sure to get bilateral mammograms in November of this year  Followup with Dr. Jana Hakim in February of next year  Return to see Dr. Dalbert Batman in one year.

## 2014-06-17 NOTE — Progress Notes (Signed)
Patient ID: Alejandra Grant, female   DOB: Dec 17, 1922, 80 y.o.   MRN: [id number]  History:  This patient underwent right partial mastectomy with needle localization on 05/21/2013. She had a invasive ductal carcinoma, stage T1b, NX, stage IA, negative margins, 1 mm anterior margin. She also has a 1 cm right lower lobe lung mass. She has no complaints about her breast and She feels fine.  She had a right breast mammogram on 03/11/2014 which is category 3, stable calcifications on the right. Followup bilateral mammograms are planned for November. The followup CT scan of the chest 03/01/2014 showed a stable right lower lobe nodule and an old  rib fracture and possible chronic hematoma left posterior chest wall. She is asymptomatic from that at this time  She has had numerous bilateral breast surgery for bilateral breast cancer.   Exam:  Patient is alert. Elderly. Ambulating independently. Her sons are not with her today  Breasts: Right breast exam shows multiple scars. t lumpectomy scar laterally has healed uneventfully. No hematoma. No infection. No drainage.  Left breast likewise shows multiple scars laterally, upper outer, and left axilla. Both breasts have a lumpy areas but there is overlying skin problem. Neck reveals no adenopathy or mass Heart regular rate and rhythm with grade 3 systolic murmur Lungs clear to auscultation bilaterally  Assessment:  Invasive ductal carcinoma right breast(second recurrence) , 9:00 position, pathologic stage T1b, NX, negative margins, close but negative anteriorly. Recovering uneventfully  No evidence of cancer at this time History right breast cancer x2, 1991 and again in 1994. Status post radiation therapy. Status post tamoxifen therapy  History left breast cancer in 1999, treated with radiation therapy and arimidex.   Plan: Repeat bilateral mammograms in November 2015 See Dr. Jana Grant in February 2016. He will continue to follow her for stable RLL  lung  nodule. Return to see me in one year   Alejandra Grant M. Alejandra Grant, M.D., [facility]  General and Minimally invasive Surgery  Breast and Colorectal Surgery  Office: [phone]  Pager: [phone]

## 2014-08-15 ENCOUNTER — Other Ambulatory Visit: Payer: Self-pay | Admitting: Family Medicine

## 2014-08-15 DIAGNOSIS — Z853 Personal history of malignant neoplasm of breast: Secondary | ICD-10-CM

## 2014-08-15 DIAGNOSIS — Z9889 Other specified postprocedural states: Secondary | ICD-10-CM

## 2014-09-11 DIAGNOSIS — F324 Major depressive disorder, single episode, in partial remission: Secondary | ICD-10-CM | POA: Diagnosis not present

## 2014-09-11 DIAGNOSIS — E785 Hyperlipidemia, unspecified: Secondary | ICD-10-CM | POA: Diagnosis not present

## 2014-09-11 DIAGNOSIS — H540 Blindness, both eyes: Secondary | ICD-10-CM | POA: Diagnosis not present

## 2014-09-11 DIAGNOSIS — E559 Vitamin D deficiency, unspecified: Secondary | ICD-10-CM | POA: Diagnosis not present

## 2014-09-11 DIAGNOSIS — M810 Age-related osteoporosis without current pathological fracture: Secondary | ICD-10-CM | POA: Diagnosis not present

## 2014-09-11 DIAGNOSIS — H3530 Unspecified macular degeneration: Secondary | ICD-10-CM | POA: Diagnosis not present

## 2014-09-11 DIAGNOSIS — I10 Essential (primary) hypertension: Secondary | ICD-10-CM | POA: Diagnosis not present

## 2014-09-12 ENCOUNTER — Ambulatory Visit
Admission: RE | Admit: 2014-09-12 | Discharge: 2014-09-12 | Disposition: A | Discharge status: Home / Self Care | Payer: Medicare Other | Source: Ambulatory Visit | Attending: Family Medicine | Admitting: Family Medicine

## 2014-09-12 DIAGNOSIS — Z9889 Other specified postprocedural states: Secondary | ICD-10-CM

## 2014-09-12 DIAGNOSIS — Z853 Personal history of malignant neoplasm of breast: Secondary | ICD-10-CM

## 2014-10-10 DIAGNOSIS — D1809 Hemangioma of other sites: Secondary | ICD-10-CM | POA: Diagnosis not present

## 2014-10-10 DIAGNOSIS — H3532 Exudative age-related macular degeneration: Secondary | ICD-10-CM | POA: Diagnosis not present

## 2014-10-10 DIAGNOSIS — H4010X0 Unspecified open-angle glaucoma, stage unspecified: Secondary | ICD-10-CM | POA: Diagnosis not present

## 2014-10-10 DIAGNOSIS — H31012 Macula scars of posterior pole (postinflammatory) (post-traumatic), left eye: Secondary | ICD-10-CM | POA: Diagnosis not present

## 2014-10-10 DIAGNOSIS — H18421 Band keratopathy, right eye: Secondary | ICD-10-CM | POA: Diagnosis not present

## 2014-10-15 DIAGNOSIS — H903 Sensorineural hearing loss, bilateral: Secondary | ICD-10-CM | POA: Diagnosis not present

## 2014-11-11 DIAGNOSIS — H6123 Impacted cerumen, bilateral: Secondary | ICD-10-CM | POA: Diagnosis not present

## 2014-12-03 ENCOUNTER — Telehealth: Payer: Self-pay | Admitting: Oncology

## 2014-12-03 NOTE — Telephone Encounter (Signed)
pt cld to get appt time & date-gave pt appt time and date-pt understood

## 2014-12-05 ENCOUNTER — Other Ambulatory Visit (HOSPITAL_BASED_OUTPATIENT_CLINIC_OR_DEPARTMENT_OTHER): Payer: Medicare Other

## 2014-12-05 ENCOUNTER — Telehealth: Payer: Self-pay | Admitting: Oncology

## 2014-12-05 ENCOUNTER — Ambulatory Visit (HOSPITAL_BASED_OUTPATIENT_CLINIC_OR_DEPARTMENT_OTHER): Payer: Medicare Other | Admitting: Oncology

## 2014-12-05 ENCOUNTER — Ambulatory Visit (HOSPITAL_COMMUNITY)
Admission: RE | Admit: 2014-12-05 | Discharge: 2014-12-05 | Disposition: A | Discharge status: Home / Self Care | Payer: Medicare Other | Source: Ambulatory Visit | Attending: Oncology | Admitting: Oncology

## 2014-12-05 VITALS — BP 180/62 | HR 61 | Resp 18 | Ht 65.0 in | Wt 116.9 lb

## 2014-12-05 DIAGNOSIS — C50411 Malignant neoplasm of upper-outer quadrant of right female breast: Secondary | ICD-10-CM | POA: Diagnosis not present

## 2014-12-05 DIAGNOSIS — R911 Solitary pulmonary nodule: Secondary | ICD-10-CM | POA: Insufficient documentation

## 2014-12-05 DIAGNOSIS — S20211A Contusion of right front wall of thorax, initial encounter: Secondary | ICD-10-CM

## 2014-12-05 DIAGNOSIS — I517 Cardiomegaly: Secondary | ICD-10-CM | POA: Insufficient documentation

## 2014-12-05 DIAGNOSIS — R918 Other nonspecific abnormal finding of lung field: Secondary | ICD-10-CM | POA: Diagnosis not present

## 2014-12-05 LAB — CBC WITH DIFFERENTIAL/PLATELET
BASO%: 0.4 % (ref 0.0–2.0)
Basophils Absolute: 0 10*3/uL (ref 0.0–0.1)
EOS%: 2.5 % (ref 0.0–7.0)
Eosinophils Absolute: 0.2 10*3/uL (ref 0.0–0.5)
HCT: 38.1 % (ref 34.8–46.6)
HGB: 12.3 g/dL (ref 11.6–15.9)
LYMPH%: 29.4 % (ref 14.0–49.7)
MCH: 29.6 pg (ref 25.1–34.0)
MCHC: 32.3 g/dL (ref 31.5–36.0)
MCV: 91.6 fL (ref 79.5–101.0)
MONO#: 0.8 10*3/uL (ref 0.1–0.9)
MONO%: 10.2 % (ref 0.0–14.0)
NEUT#: 4.6 10*3/uL (ref 1.5–6.5)
NEUT%: 57.5 % (ref 38.4–76.8)
Platelets: 263 10*3/uL (ref 145–400)
RBC: 4.16 10*6/uL (ref 3.70–5.45)
RDW: 15.8 % — ABNORMAL HIGH (ref 11.2–14.5)
WBC: 8 10*3/uL (ref 3.9–10.3)
lymph#: 2.4 10*3/uL (ref 0.9–3.3)

## 2014-12-05 LAB — COMPREHENSIVE METABOLIC PANEL (CC13)
ALBUMIN: 3.9 g/dL (ref 3.5–5.0)
ALT: 20 U/L (ref 0–55)
ANION GAP: 10 meq/L (ref 3–11)
AST: 27 U/L (ref 5–34)
Alkaline Phosphatase: 60 U/L (ref 40–150)
BILIRUBIN TOTAL: 0.3 mg/dL (ref 0.20–1.20)
BUN: 19.7 mg/dL (ref 7.0–26.0)
CHLORIDE: 101 meq/L (ref 98–109)
CO2: 29 mEq/L (ref 22–29)
Calcium: 10.7 mg/dL — ABNORMAL HIGH (ref 8.4–10.4)
Creatinine: 0.7 mg/dL (ref 0.6–1.1)
EGFR: 71 mL/min/{1.73_m2} — ABNORMAL LOW (ref >90)
Glucose: 99 mg/dl (ref 70–140)
POTASSIUM: 4 meq/L (ref 3.5–5.1)
Sodium: 140 mEq/L (ref 136–145)
Total Protein: 6.8 g/dL (ref 6.4–8.3)

## 2014-12-05 NOTE — Progress Notes (Signed)
ID: Alejandra Grant OB: Aug 05, 1923  MR#: [id number]  YFV#:[id number]  PCP: Vidal Schwalbe, MD GYN:   SUFanny Skates OTHER MD: Kyung Rudd, Todd McDiarmid, Izora Gala, Rupinder Orene Desanctis  CHIEF COMPLAINT: Estrogen receptor positive breast cancer  CURRENT TREATMENT: Observation   HISTORY OF PRESENT ILLNESS: From the prior summary note:  Ms. Alejandra Grant has a prior history of bilateral lumpectomies, bilateral rest irradiation, and prior treatment with tamoxifen (approximately 4 and half years) and an anastrozole (unknown duration). The data is being obtained from the archives.  More recently some new calcifications were noted in the right breast, and digital right breast mammography with magnification views confirmed a cluster in the posterior third of the breast spanning an area of 1.3 cm. Repeat within 6 months was suggested, and performed 04/02/2013. The patient's breasts are heterogeneously dense. The area of 1.3 cm of microcalcifications had increased in number, and spanned 1.6 cm and this study. Accordingly biopsy was obtained 04/18/2013, and this showed ([id number]) high-grade ductal carcinoma in situ, with a 2 mm area of invasive disease. The invasive tumor was estrogen receptor 100% positive, progesterone receptor negative, with an MIB-1 of 2% and no HER-2 amplification.  The patient's subsequent history is as detailed below  INTERVAL HISTORY: Alejandra Grant returns today for followup of her early stage breast cancer. The interval history is generally unremarkable. She will be turning 81 in a couple of months. She has no plans "yet" but suspects there is a party a foot.  REVIEW OF SYSTEMS: She feels "alright" in general. Her worse problem is her hearing. She did just get a new hearing aid, but it is beeping all the time and has not been helpful yet. She denies unusual headaches, nausea, vomiting, dizziness, or falls. She has had no worsening shortness of breath, pleurisy, cough,  hemoptysis, or shortness of breath. Overall a detailed review of systems today was entirely stable  PAST MEDICAL HISTORY: Past Medical History  Diagnosis Date  . Hypertension   . Urinary incontinence   . Depression   . Anxiety   . 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 Heart murmur   . Cancer     breast, skin  . Anemia     PAST SURGICAL HISTORY: Past Surgical History  Procedure Laterality Date  . Breast lumpectomy Bilateral     left 1991,right side 1994  . Abdominal hysterectomy    . Back surgery    . Eye surgery Bilateral     cat  . Tonsillectomy    . Partial mastectomy with needle localization Right 05/21/2013    Procedure: PARTIAL MASTECTOMY WITH NEEDLE LOCALIZATION;  Surgeon: Adin Hector, MD;  Location: [facility];  Service: General;  Laterality: Right;    FAMILY HISTORY No family history on file. The patient's mother died giving birth to the patient, at age 62. The patient did not grow up with her father and has very little information regarding him or his side of the family. She believes he died in his 21s in a state hospital. The patient had no brothers or sisters.  GYNECOLOGIC HISTORY:  The patient does not recall how old she was at menarche. She thinks she went through menopause in her 38s. She is GX P2 with first live birth at age 69. The patient is status post hysterectomy but does not know if her ovaries were removed or not.  SOCIAL HISTORY:  The patient worked as a Museum/gallery conservator. She  is a widow and lives by herself. She uses the [employer] and other support services to get around, do her shopping, etc. Son Alejandra Grant lives in [place] and works in Scientist, research (life sciences) estate. Son Alejandra Grant lives in [place] and is in Economist. The patient has 3 grandchildren. She attends the first [employer] locally.    ADVANCED DIRECTIVES: In place   HEALTH MAINTENANCE: History  Substance Use Topics  . Smoking status: Never  Smoker   . Smokeless tobacco: Not on file  . Alcohol Use: No     Colonoscopy:  PAP:  Bone density: October 2008, showing osteoporosis (left hip T score -2.6)  Lipid panel:  Allergies  Allergen Reactions  . Aspirin     Bleeding / rash  . Emete-Con [Benzquinamide]     unknown    Current Outpatient Prescriptions  Medication Sig Dispense Refill  . acetaminophen (TYLENOL) 500 MG tablet Take 500 mg by mouth every 6 (six) hours as needed for moderate pain.    Marland Kitchen alendronate (FOSAMAX) 70 MG tablet Take 70 mg by mouth every 7 (seven) days.     . carvedilol (COREG) 3.125 MG tablet 1 tablet 2 (two) times daily.    Marland Kitchen FLUoxetine (PROZAC) 20 MG capsule Take 20 mg by mouth daily.     . hydrALAZINE (APRESOLINE) 25 MG tablet Take 25-50 mg by mouth 4 (four) times daily. 50 mg in the a.m. , 25 mg at noon, 50 mg at dinner, and 25 mg at bedtime    . mirtazapine (REMERON) 30 MG tablet Take 30 mg by mouth at bedtime.     . Multiple Vitamins-Minerals (ICAPS MV PO) Take by mouth 1 day or 1 dose.    . potassium chloride (KLOR-CON) 8 MEQ tablet Take 16 mEq by mouth 2 (two) times daily.    . pravastatin (PRAVACHOL) 40 MG tablet Take 40 mg by mouth at bedtime.     . prednisoLONE acetate (PRED FORTE) 1 % ophthalmic suspension Place 1 drop into the right eye 2 (two) times daily.    . timolol (TIMOPTIC) 0.5 % ophthalmic solution Place 1 drop into both eyes 2 (two) times daily.    . traMADol (ULTRAM) 50 MG tablet Take 50 mg by mouth every 6 (six) hours as needed.    Alejandra Grant 40-10-25 MG TABS Take 1 tablet by mouth every morning.      No current facility-administered medications for this visit.    OBJECTIVE: Elderly white woman who appears stated age 81 Vitals:   12/05/14 1207  BP: 180/62  Pulse: 61  Resp: 18     Body mass index is 19.45 kg/(m^2).    ECOG FS: 1  Sclerae unicteric; right eye is blind (previously noted) No cervical or supraclavicular adenopathy Lungs no rales or rhonchi, no wheezes  noted Abd soft, nontender, positive bowel sounds MSK no peripheral edema, no focal spinal tenderness Neuro: non-focal, pleasant affect, well oriented Breasts: Status post remote lumpectomies bilaterally and more recent right lumpectomy. There is no evidence of local recurrence on either side, both axillae are benign  LAB RESULTS:  CMP     Component Value Date/Time   NA 140 12/05/2014 1135   NA 139 05/11/2013 1318   K 4.0 12/05/2014 1135   K 3.6 05/11/2013 1318   CL 99 05/11/2013 1318   CL 102 04/25/2013 1250   CO2 29 12/05/2014 1135   CO2 29 05/11/2013 1318   GLUCOSE 99 12/05/2014 1135   GLUCOSE 142*  05/11/2013 1318   GLUCOSE 120* 04/25/2013 1250   BUN 19.7 12/05/2014 1135   BUN 22 05/11/2013 1318   CREATININE 0.7 12/05/2014 1135   CREATININE 0.75 05/11/2013 1318   CALCIUM 10.7* 12/05/2014 1135   CALCIUM 10.6* 05/11/2013 1318   PROT 6.8 12/05/2014 1135   PROT 6.9 05/11/2013 1318   ALBUMIN 3.9 12/05/2014 1135   ALBUMIN 3.9 05/11/2013 1318   AST 27 12/05/2014 1135   AST 26 05/11/2013 1318   ALT 20 12/05/2014 1135   ALT 17 05/11/2013 1318   ALKPHOS 60 12/05/2014 1135   ALKPHOS 57 05/11/2013 1318   BILITOT 0.30 12/05/2014 1135   BILITOT 0.3 05/11/2013 1318   GFRNONAA 73* 05/11/2013 1318   GFRAA 85* 05/11/2013 1318    I No results found for: SPEP  Lab Results  Component Value Date   WBC 8.0 12/05/2014   NEUTROABS 4.6 12/05/2014   HGB 12.3 12/05/2014   HCT 38.1 12/05/2014   MCV 91.6 12/05/2014   PLT 263 12/05/2014      Chemistry      Component Value Date/Time   NA 140 12/05/2014 1135   NA 139 05/11/2013 1318   K 4.0 12/05/2014 1135   K 3.6 05/11/2013 1318   CL 99 05/11/2013 1318   CL 102 04/25/2013 1250   CO2 29 12/05/2014 1135   CO2 29 05/11/2013 1318   BUN 19.7 12/05/2014 1135   BUN 22 05/11/2013 1318   CREATININE 0.7 12/05/2014 1135   CREATININE 0.75 05/11/2013 1318      Component Value Date/Time   CALCIUM 10.7* 12/05/2014 1135   CALCIUM 10.6*  05/11/2013 1318   ALKPHOS 60 12/05/2014 1135   ALKPHOS 57 05/11/2013 1318   AST 27 12/05/2014 1135   AST 26 05/11/2013 1318   ALT 20 12/05/2014 1135   ALT 17 05/11/2013 1318   BILITOT 0.30 12/05/2014 1135   BILITOT 0.3 05/11/2013 1318       No results found for: LABCA2  No components found for: LABCA125  No results for input(s): INR in the last 168 hours.  Urinalysis    Component Value Date/Time   COLORURINE YELLOW 05/11/2013 1317   APPEARANCEUR CLEAR 05/11/2013 1317   LABSPEC 1.014 05/11/2013 1317   PHURINE 7.5 05/11/2013 1317   GLUCOSEU NEGATIVE 05/11/2013 1317   HGBUR NEGATIVE 05/11/2013 1317   BILIRUBINUR NEGATIVE 05/11/2013 1317   KETONESUR NEGATIVE 05/11/2013 1317   PROTEINUR NEGATIVE 05/11/2013 1317   UROBILINOGEN 1.0 05/11/2013 1317   NITRITE NEGATIVE 05/11/2013 1317   LEUKOCYTESUR NEGATIVE 05/11/2013 1317    STUDIES: CLINICAL DATA: Patient is a currently asymptomatic 81 year old female with a personal history of right breast conservation therapy performed in July 2014. She also has a history of more remote bilateral malignant lumpectomies.  EXAM: DIGITAL DIAGNOSTIC BILATERAL MAMMOGRAM WITH CAD  COMPARISON: 08/21/2008 through 03/11/2014.  ACR Breast Density Category c: The breast tissue is heterogeneously dense, which may obscure small masses.  FINDINGS: Digital bilateral diagnostic mammography demonstrates a heterogeneously dense parenchymal pattern which may obscure detection of small masses. Postsurgical changes are identified bilaterally and unchanged. Spot-compression magnification view of the lumpectomy bed in the upper outer right breast demonstrate mild stable postsurgical scarring and effacement of normal fibroglandular tissue. No new mass or suspicious microcalcification. There has been no significant change.  Mammographic images were processed with CAD.  IMPRESSION: Postsurgical changes bilaterally without significant interval  change or mammographic findings of malignancy.  RECOMMENDATION: Correlation with physical exam and diagnostic mammography in 1 year  if clinically indicated  I have discussed the findings and recommendations with the patient. Results were also provided in writing at the conclusion of the visit. If applicable, a reminder letter will be sent to the patient regarding the next appointment.  BI-RADS CATEGORY 2: Benign Finding(s)   Electronically Signed  By: Andres Shad  On: 09/12/2014 11:54  ASSESSMENT: 81 y.o. [place] woman  (1) status post remote lumpectomies to both breasts, with adjuvant radiation bilaterally, and prior history of tamoxifen and Arimidex  (2) Status post right breast biopsy 04/18/2013 for ductal carcinoma in situ, high-grade, with an area of microinvasion; the invasive cancer was estrogen receptor positive at 100%, progesterone receptor and HER-2 negative, with anMIB-1 of 2%.  (3)  status post right lumpectomy 05/21/2013 for a pT1b pnX, stage IA invasive ductal carcinoma, with negative margins(closest 1 mm from anterior margin)  (4) genetics testing discussed--patient and family prefer to forego genetic testing  (5) the patient has a 1 cm right lower lobe lung mass of unclear etiology, requiring followup.  PLAN: We obtained a repeat chest x-ray today and it looks stable to me, but I am awaiting definitive results. We will repeat a chest x-ray again at the next visit, which will be in June. We should obtain a repeat CT of the chest before the visit after that, which will be in October. If the small lung nodule remains stable at that point it will not require further follow-up.  From a breast cancer point of view she is very stable. We could always go back to anti-estrogens, but given the overall situation I feel very comfortable with observation alone.  Minnie has a good understanding of the overall plan. She agrees with it. She will call with any  problems that may develop before her next visit here.  Chauncey Cruel, MD   12/05/2014 12:15 PM

## 2014-12-05 NOTE — Telephone Encounter (Signed)
per pof to sch pt appt-gave pt copy of sch °

## 2014-12-05 NOTE — Addendum Note (Signed)
Addended by: Jaci Carrel A on: 12/05/2014 12:57 PM   Modules accepted: Orders, Medications

## 2014-12-08 ENCOUNTER — Encounter: Payer: Self-pay | Admitting: Oncology

## 2015-01-30 ENCOUNTER — Other Ambulatory Visit: Payer: Self-pay | Admitting: Dermatology

## 2015-01-30 DIAGNOSIS — D485 Neoplasm of uncertain behavior of skin: Secondary | ICD-10-CM | POA: Diagnosis not present

## 2015-01-30 DIAGNOSIS — C44311 Basal cell carcinoma of skin of nose: Secondary | ICD-10-CM | POA: Diagnosis not present

## 2015-01-30 DIAGNOSIS — L918 Other hypertrophic disorders of the skin: Secondary | ICD-10-CM | POA: Diagnosis not present

## 2015-03-13 DIAGNOSIS — C44311 Basal cell carcinoma of skin of nose: Secondary | ICD-10-CM | POA: Diagnosis not present

## 2015-04-10 ENCOUNTER — Ambulatory Visit (HOSPITAL_COMMUNITY)
Admission: RE | Admit: 2015-04-10 | Discharge: 2015-04-10 | Disposition: A | Discharge status: Home / Self Care | Payer: Medicare Other | Source: Ambulatory Visit | Attending: Nurse Practitioner | Admitting: Nurse Practitioner

## 2015-04-10 ENCOUNTER — Other Ambulatory Visit (HOSPITAL_BASED_OUTPATIENT_CLINIC_OR_DEPARTMENT_OTHER): Payer: Medicare Other

## 2015-04-10 ENCOUNTER — Encounter: Payer: Self-pay | Admitting: Nurse Practitioner

## 2015-04-10 ENCOUNTER — Ambulatory Visit (HOSPITAL_BASED_OUTPATIENT_CLINIC_OR_DEPARTMENT_OTHER): Payer: Medicare Other | Admitting: Nurse Practitioner

## 2015-04-10 VITALS — BP 174/63 | HR 63 | Temp 98.4°F | Resp 18 | Ht 65.0 in | Wt 116.2 lb

## 2015-04-10 DIAGNOSIS — C50411 Malignant neoplasm of upper-outer quadrant of right female breast: Secondary | ICD-10-CM | POA: Diagnosis not present

## 2015-04-10 DIAGNOSIS — I517 Cardiomegaly: Secondary | ICD-10-CM | POA: Diagnosis not present

## 2015-04-10 DIAGNOSIS — R918 Other nonspecific abnormal finding of lung field: Secondary | ICD-10-CM

## 2015-04-10 DIAGNOSIS — M810 Age-related osteoporosis without current pathological fracture: Secondary | ICD-10-CM | POA: Diagnosis not present

## 2015-04-10 DIAGNOSIS — R911 Solitary pulmonary nodule: Secondary | ICD-10-CM

## 2015-04-10 LAB — CBC WITH DIFFERENTIAL/PLATELET
BASO%: 1 % (ref 0.0–2.0)
BASOS ABS: 0.1 10*3/uL (ref 0.0–0.1)
EOS ABS: 0.3 10*3/uL (ref 0.0–0.5)
EOS%: 3.9 % (ref 0.0–7.0)
HEMATOCRIT: 37.3 % (ref 34.8–46.6)
HGB: 12.4 g/dL (ref 11.6–15.9)
LYMPH#: 2 10*3/uL (ref 0.9–3.3)
LYMPH%: 28.5 % (ref 14.0–49.7)
MCH: 30 pg (ref 25.1–34.0)
MCHC: 33.2 g/dL (ref 31.5–36.0)
MCV: 90.5 fL (ref 79.5–101.0)
MONO#: 0.7 10*3/uL (ref 0.1–0.9)
MONO%: 10.4 % (ref 0.0–14.0)
NEUT#: 4 10*3/uL (ref 1.5–6.5)
NEUT%: 56.2 % (ref 38.4–76.8)
PLATELETS: 239 10*3/uL (ref 145–400)
RBC: 4.12 10*6/uL (ref 3.70–5.45)
RDW: 16.4 % — ABNORMAL HIGH (ref 11.2–14.5)
WBC: 7 10*3/uL (ref 3.9–10.3)

## 2015-04-10 LAB — COMPREHENSIVE METABOLIC PANEL (CC13)
ALBUMIN: 3.7 g/dL (ref 3.5–5.0)
ALT: 19 U/L (ref 0–55)
AST: 31 U/L (ref 5–34)
Alkaline Phosphatase: 56 U/L (ref 40–150)
Anion Gap: 7 mEq/L (ref 3–11)
BILIRUBIN TOTAL: 0.33 mg/dL (ref 0.20–1.20)
BUN: 21 mg/dL (ref 7.0–26.0)
CALCIUM: 10.2 mg/dL (ref 8.4–10.4)
CO2: 27 meq/L (ref 22–29)
Chloride: 102 mEq/L (ref 98–109)
Creatinine: 0.8 mg/dL (ref 0.6–1.1)
EGFR: 66 mL/min/{1.73_m2} — ABNORMAL LOW (ref >90)
GLUCOSE: 134 mg/dL (ref 70–140)
Potassium: 3.7 mEq/L (ref 3.5–5.1)
Sodium: 137 mEq/L (ref 136–145)
TOTAL PROTEIN: 6.4 g/dL (ref 6.4–8.3)

## 2015-04-10 NOTE — Progress Notes (Signed)
ID: Alejandra Grant OB: 1923-01-30  MR#: [id number]  ELT#:[id number]  PCP: Vidal Schwalbe, MD GYN:   SUFanny Skates OTHER MD: Kyung Rudd, Todd McDiarmid, Izora Gala, Rupinder Orene Desanctis  CHIEF COMPLAINT: Estrogen receptor positive breast cancer  CURRENT TREATMENT: Observation  BREAST CANCER HISTORY: From the prior summary note:  [name] has a prior history of bilateral lumpectomies, bilateral rest irradiation, and prior treatment with tamoxifen (approximately 4 and half years) and an anastrozole (unknown duration). The data is being obtained from the archives.  More recently some new calcifications were noted in the right breast, and digital right breast mammography with magnification views confirmed a cluster in the posterior third of the breast spanning an area of 1.3 cm. Repeat within 6 months was suggested, and performed 04/02/2013. The patient's breasts are heterogeneously dense. The area of 1.3 cm of microcalcifications had increased in number, and spanned 1.6 cm and this study. Accordingly biopsy was obtained 04/18/2013, and this showed ([id number]) high-grade ductal carcinoma in situ, with a 2 mm area of invasive disease. The invasive tumor was estrogen receptor 100% positive, progesterone receptor negative, with an MIB-1 of 2% and no HER-2 amplification.  The patient's subsequent history is as detailed below  INTERVAL HISTORY: Alejandra Grant returns today for follow up of her early stage breast cancer, accompanied by her son. The interval history is generally unremarkable. She "can't see and can't hear" but otherwise does not have many complaints to offer.  REVIEW OF SYSTEMS: [name] denies fevers, chills, nausea, or vomiting. She is chronically constipated and uses a combination of suppositories and prune juice. She continues to wear a hearing aid to her left ear. She denies headaches, dizziness, or weakness. She denies shortness of breath, chest pain, cough, or palpitations.  A detailed review of systems is otherwise stable.  PAST MEDICAL HISTORY: Past Medical History  Diagnosis Date  . Hypertension   . Urinary incontinence   . Depression   . Anxiety   . 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 Heart murmur   . Cancer     breast, skin  . Anemia     PAST SURGICAL HISTORY: Past Surgical History  Procedure Laterality Date  . Breast lumpectomy Bilateral     left 1991,right side 1994  . Abdominal hysterectomy    . Back surgery    . Eye surgery Bilateral     cat  . Tonsillectomy    . Partial mastectomy with needle localization Right 05/21/2013    Procedure: PARTIAL MASTECTOMY WITH NEEDLE LOCALIZATION;  Surgeon: Adin Hector, MD;  Location: [facility];  Service: General;  Laterality: Right;    FAMILY HISTORY No family history on file. The patient's mother died giving birth to the patient, at age 65. The patient did not grow up with her father and has very little information regarding him or his side of the family. She believes he died in his 50s in a state hospital. The patient had no brothers or sisters.  GYNECOLOGIC HISTORY:  The patient does not recall how old she was at menarche. She thinks she went through menopause in her 54s. She is GX P2 with first live birth at age 50. The patient is status post hysterectomy but does not know if her ovaries were removed or not.  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 She uses the [employer] and other support services  to get around, do her shopping, etc. Son Alejandra Grant lives in [place] and works in Scientist, research (life sciences) estate. Son Alejandra Grant lives in [place] and is in Economist. The patient has 3 grandchildren. She attends the first [employer] locally.    ADVANCED DIRECTIVES: In place   HEALTH MAINTENANCE: History  Substance Use Topics  . Smoking status: Never Smoker   . Smokeless tobacco: Not on file  . Alcohol  Use: No     Colonoscopy:  PAP:  Bone density: October 2008, showing osteoporosis (left hip T score -2.6)  Lipid panel:  Allergies  Allergen Reactions  . Aspirin     Bleeding / rash  . Emete-Con [Benzquinamide]     unknown    Current Outpatient Prescriptions  Medication Sig Dispense Refill  . alendronate (FOSAMAX) 70 MG tablet Take 70 mg by mouth every 7 (seven) days.     . carvedilol (COREG) 3.125 MG tablet 1 tablet 2 (two) times daily.    Marland Kitchen FLUoxetine (PROZAC) 20 MG capsule Take 20 mg by mouth daily.     . hydrALAZINE (APRESOLINE) 25 MG tablet Take 25-50 mg by mouth 4 (four) times daily. 50 mg in the a.m. , 25 mg at noon, 50 mg at dinner, and 25 mg at bedtime    . mirtazapine (REMERON) 30 MG tablet Take 30 mg by mouth at bedtime.     . Multiple Vitamins-Minerals (ICAPS MV PO) Take by mouth 1 day or 1 dose.    . potassium chloride (KLOR-CON) 8 MEQ tablet Take 16 mEq by mouth 2 (two) times daily.    . pravastatin (PRAVACHOL) 40 MG tablet Take 40 mg by mouth at bedtime.     . prednisoLONE acetate (PRED FORTE) 1 % ophthalmic suspension Place 1 drop into the right eye 2 (two) times daily.    . timolol (TIMOPTIC) 0.5 % ophthalmic solution Place 1 drop into both eyes 2 (two) times daily.    [name] 40-10-25 MG TABS Take 1 tablet by mouth every morning.     . fluorometholone (FML) 0.1 % ophthalmic suspension     . traMADol (ULTRAM) 50 MG tablet Take 50 mg by mouth every 6 (six) hours as needed.     No current facility-administered medications for this visit.    OBJECTIVE: Elderly white woman who appears stated age 81 Vitals:   04/10/15 1344  BP: 174/63  Pulse: 63  Temp: 98.4 F (36.9 C)  Resp: 18     Body mass index is 19.34 kg/(m^2).    ECOG FS: 1  Skin: warm, dry  HEENT: sclerae anicteric, conjunctivae pink, oropharynx clear. Right eye blindness No thrush or mucositis.  Lymph Nodes: No cervical or supraclavicular lymphadenopathy  Lungs: clear to auscultation  bilaterally, no rales, wheezes, or rhonci  Heart: regular rate and rhythm  Abdomen: round, soft, non tender, positive bowel sounds  Musculoskeletal: No focal spinal tenderness, no peripheral edema  Neuro: non focal, well oriented, positive affect  Breast: bilateral breasts status post lumpectomies. No evidence of recurrent disease. Bilateral axillae benign   LAB RESULTS:  CMP     Component Value Date/Time   NA 137 04/10/2015 1327   NA 139 05/11/2013 1318   K 3.7 04/10/2015 1327   K 3.6 05/11/2013 1318   CL 99 05/11/2013 1318   CL 102 04/25/2013 1250   CO2 27 04/10/2015 1327   CO2 29 05/11/2013 1318   GLUCOSE 134 04/10/2015 1327   GLUCOSE 142* 05/11/2013 1318  GLUCOSE 120* 04/25/2013 1250   BUN 21.0 04/10/2015 1327   BUN 22 05/11/2013 1318   CREATININE 0.8 04/10/2015 1327   CREATININE 0.75 05/11/2013 1318   CALCIUM 10.2 04/10/2015 1327   CALCIUM 10.6* 05/11/2013 1318   PROT 6.4 04/10/2015 1327   PROT 6.9 05/11/2013 1318   ALBUMIN 3.7 04/10/2015 1327   ALBUMIN 3.9 05/11/2013 1318   AST 31 04/10/2015 1327   AST 26 05/11/2013 1318   ALT 19 04/10/2015 1327   ALT 17 05/11/2013 1318   ALKPHOS 56 04/10/2015 1327   ALKPHOS 57 05/11/2013 1318   BILITOT 0.33 04/10/2015 1327   BILITOT 0.3 05/11/2013 1318   GFRNONAA 73* 05/11/2013 1318   GFRAA 85* 05/11/2013 1318    I No results found for: SPEP  Lab Results  Component Value Date   WBC 7.0 04/10/2015   NEUTROABS 4.0 04/10/2015   HGB 12.4 04/10/2015   HCT 37.3 04/10/2015   MCV 90.5 04/10/2015   PLT 239 04/10/2015      Chemistry      Component Value Date/Time   NA 137 04/10/2015 1327   NA 139 05/11/2013 1318   K 3.7 04/10/2015 1327   K 3.6 05/11/2013 1318   CL 99 05/11/2013 1318   CL 102 04/25/2013 1250   CO2 27 04/10/2015 1327   CO2 29 05/11/2013 1318   BUN 21.0 04/10/2015 1327   BUN 22 05/11/2013 1318   CREATININE 0.8 04/10/2015 1327   CREATININE 0.75 05/11/2013 1318      Component Value Date/Time    CALCIUM 10.2 04/10/2015 1327   CALCIUM 10.6* 05/11/2013 1318   ALKPHOS 56 04/10/2015 1327   ALKPHOS 57 05/11/2013 1318   AST 31 04/10/2015 1327   AST 26 05/11/2013 1318   ALT 19 04/10/2015 1327   ALT 17 05/11/2013 1318   BILITOT 0.33 04/10/2015 1327   BILITOT 0.3 05/11/2013 1318       No results found for: LABCA2  No components found for: LABCA125  No results for input(s): INR in the last 168 hours.  Urinalysis    Component Value Date/Time   COLORURINE YELLOW 05/11/2013 1317   APPEARANCEUR CLEAR 05/11/2013 1317   LABSPEC 1.014 05/11/2013 1317   PHURINE 7.5 05/11/2013 1317   GLUCOSEU NEGATIVE 05/11/2013 1317   HGBUR NEGATIVE 05/11/2013 1317   BILIRUBINUR NEGATIVE 05/11/2013 1317   KETONESUR NEGATIVE 05/11/2013 1317   PROTEINUR NEGATIVE 05/11/2013 1317   UROBILINOGEN 1.0 05/11/2013 1317   NITRITE NEGATIVE 05/11/2013 1317   LEUKOCYTESUR NEGATIVE 05/11/2013 1317    STUDIES: CLINICAL DATA: Patient is a currently asymptomatic 81 year old female with a personal history of right breast conservation therapy performed in July 2014. She also has a history of more remote bilateral malignant lumpectomies.  EXAM: DIGITAL DIAGNOSTIC BILATERAL MAMMOGRAM WITH CAD  COMPARISON: 08/21/2008 through 03/11/2014.  ACR Breast Density Category c: The breast tissue is heterogeneously dense, which may obscure small masses.  FINDINGS: Digital bilateral diagnostic mammography demonstrates a heterogeneously dense parenchymal pattern which may obscure detection of small masses. Postsurgical changes are identified bilaterally and unchanged. Spot-compression magnification view of the lumpectomy bed in the upper outer right breast demonstrate mild stable postsurgical scarring and effacement of normal fibroglandular tissue. No new mass or suspicious microcalcification. There has been no significant change.  Mammographic images were processed with CAD.  IMPRESSION: Postsurgical  changes bilaterally without significant interval change or mammographic findings of malignancy.  RECOMMENDATION: Correlation with physical exam and diagnostic mammography in 1 year if clinically indicated  I have discussed the findings and recommendations with the patient. Results were also provided in writing at the conclusion of the visit. If applicable, a reminder letter will be sent to the patient regarding the next appointment.  BI-RADS CATEGORY 2: Benign Finding(s)   Electronically Signed  By: Andres Shad  On: 09/12/2014 11:54  ASSESSMENT: 81 y.o. [place] woman  (1) status post remote lumpectomies to both breasts, with adjuvant radiation bilaterally, and prior history of tamoxifen and Arimidex  (2) Status post right breast biopsy 04/18/2013 for ductal carcinoma in situ, high-grade, with an area of microinvasion; the invasive cancer was estrogen receptor positive at 100%, progesterone receptor and HER-2 negative, with anMIB-1 of 2%.  (3)  status post right lumpectomy 05/21/2013 for a pT1b pnX, stage IA invasive ductal carcinoma, with negative margins(closest 1 mm from anterior margin)  (4) genetics testing discussed--patient and family prefer to forego genetic testing  (5) the patient has a 1 cm right lower lobe lung mass of unclear etiology, requiring followup.  PLAN: Talana is doing well as far as her breast cancer is concerned. She shows no signs of recurrent disease. The labs were reviewed in detail and were entirely stable. I am sending her for a repeat chest xray immediately following this visit.   Fern will return in 4 months for labs and a follow up visit with Dr. Jana Hakim. Prior to this visit she will have a chest CT. If the right lower lung mass is stable we will discontinue follow up. She understands and agrees with this plan. She has been encouraged to call with any issues that might arise before her next visit here.  Laurie Panda, NP    04/10/2015 2:26 PM

## 2015-04-15 ENCOUNTER — Other Ambulatory Visit: Payer: Self-pay | Admitting: *Deleted

## 2015-04-15 ENCOUNTER — Telehealth: Payer: Self-pay | Admitting: *Deleted

## 2015-04-15 ENCOUNTER — Telehealth: Payer: Self-pay | Admitting: Oncology

## 2015-04-15 NOTE — Telephone Encounter (Signed)
Confirmed appointment for October. Mailed calendar.

## 2015-04-15 NOTE — Telephone Encounter (Signed)
Patient called requesting results of cxr from last Thursday. Called patient to report "no acute findings" on cxr. Patient verbalized understanding.

## 2015-05-21 DIAGNOSIS — R350 Frequency of micturition: Secondary | ICD-10-CM | POA: Diagnosis not present

## 2015-05-21 DIAGNOSIS — N3946 Mixed incontinence: Secondary | ICD-10-CM | POA: Diagnosis not present

## 2015-06-09 ENCOUNTER — Other Ambulatory Visit: Payer: Self-pay | Admitting: General Surgery

## 2015-06-09 DIAGNOSIS — R918 Other nonspecific abnormal finding of lung field: Secondary | ICD-10-CM | POA: Diagnosis not present

## 2015-06-09 DIAGNOSIS — N63 Unspecified lump in breast: Secondary | ICD-10-CM | POA: Diagnosis not present

## 2015-06-09 DIAGNOSIS — I10 Essential (primary) hypertension: Secondary | ICD-10-CM | POA: Diagnosis not present

## 2015-06-09 DIAGNOSIS — Z853 Personal history of malignant neoplasm of breast: Secondary | ICD-10-CM | POA: Diagnosis not present

## 2015-06-09 DIAGNOSIS — R011 Cardiac murmur, unspecified: Secondary | ICD-10-CM | POA: Diagnosis not present

## 2015-06-09 DIAGNOSIS — F329 Major depressive disorder, single episode, unspecified: Secondary | ICD-10-CM | POA: Diagnosis not present

## 2015-06-09 DIAGNOSIS — F419 Anxiety disorder, unspecified: Secondary | ICD-10-CM | POA: Diagnosis not present

## 2015-06-09 DIAGNOSIS — N6312 Unspecified lump in the right breast, upper inner quadrant: Secondary | ICD-10-CM

## 2015-06-10 ENCOUNTER — Other Ambulatory Visit: Payer: Self-pay | Admitting: General Surgery

## 2015-06-12 ENCOUNTER — Other Ambulatory Visit: Payer: Medicare Other

## 2015-06-12 DIAGNOSIS — D1809 Hemangioma of other sites: Secondary | ICD-10-CM | POA: Diagnosis not present

## 2015-06-12 DIAGNOSIS — H3532 Exudative age-related macular degeneration: Secondary | ICD-10-CM | POA: Diagnosis not present

## 2015-06-12 DIAGNOSIS — H31012 Macula scars of posterior pole (postinflammatory) (post-traumatic), left eye: Secondary | ICD-10-CM | POA: Diagnosis not present

## 2015-06-16 DIAGNOSIS — E785 Hyperlipidemia, unspecified: Secondary | ICD-10-CM | POA: Diagnosis not present

## 2015-06-16 DIAGNOSIS — I10 Essential (primary) hypertension: Secondary | ICD-10-CM | POA: Diagnosis not present

## 2015-06-16 DIAGNOSIS — G2581 Restless legs syndrome: Secondary | ICD-10-CM | POA: Diagnosis not present

## 2015-06-16 DIAGNOSIS — E559 Vitamin D deficiency, unspecified: Secondary | ICD-10-CM | POA: Diagnosis not present

## 2015-06-16 DIAGNOSIS — M549 Dorsalgia, unspecified: Secondary | ICD-10-CM | POA: Diagnosis not present

## 2015-06-17 ENCOUNTER — Telehealth: Payer: Self-pay | Admitting: *Deleted

## 2015-06-17 NOTE — Telephone Encounter (Signed)
TC from patient requesting results from chest xray done in June and and also asking when her next appt is with Dr. Jana Hakim. Reviewed xray results with pt-no acute findings, and informed her of her next appt in October.  Patient voiced understanding.

## 2015-06-18 ENCOUNTER — Ambulatory Visit
Admission: RE | Admit: 2015-06-18 | Discharge: 2015-06-18 | Disposition: A | Discharge status: Home / Self Care | Payer: Medicare Other | Source: Ambulatory Visit | Attending: General Surgery | Admitting: General Surgery

## 2015-06-18 ENCOUNTER — Other Ambulatory Visit: Payer: Self-pay | Admitting: General Surgery

## 2015-06-18 DIAGNOSIS — N6312 Unspecified lump in the right breast, upper inner quadrant: Secondary | ICD-10-CM

## 2015-06-18 DIAGNOSIS — N63 Unspecified lump in breast: Secondary | ICD-10-CM | POA: Diagnosis not present

## 2015-06-18 DIAGNOSIS — Z853 Personal history of malignant neoplasm of breast: Secondary | ICD-10-CM | POA: Diagnosis not present

## 2015-06-23 ENCOUNTER — Other Ambulatory Visit: Payer: Self-pay | Admitting: General Surgery

## 2015-07-02 ENCOUNTER — Ambulatory Visit
Admission: RE | Admit: 2015-07-02 | Discharge: 2015-07-02 | Disposition: A | Discharge status: Home / Self Care | Payer: Medicare Other | Source: Ambulatory Visit | Attending: General Surgery | Admitting: General Surgery

## 2015-07-02 ENCOUNTER — Other Ambulatory Visit: Payer: Self-pay | Admitting: General Surgery

## 2015-07-02 DIAGNOSIS — N63 Unspecified lump in breast: Secondary | ICD-10-CM | POA: Diagnosis not present

## 2015-07-02 DIAGNOSIS — N6312 Unspecified lump in the right breast, upper inner quadrant: Secondary | ICD-10-CM

## 2015-07-02 DIAGNOSIS — C50411 Malignant neoplasm of upper-outer quadrant of right female breast: Secondary | ICD-10-CM | POA: Diagnosis not present

## 2015-07-14 DIAGNOSIS — R918 Other nonspecific abnormal finding of lung field: Secondary | ICD-10-CM | POA: Diagnosis not present

## 2015-07-14 DIAGNOSIS — C50911 Malignant neoplasm of unspecified site of right female breast: Secondary | ICD-10-CM | POA: Diagnosis not present

## 2015-07-14 DIAGNOSIS — F419 Anxiety disorder, unspecified: Secondary | ICD-10-CM | POA: Diagnosis not present

## 2015-07-14 DIAGNOSIS — R011 Cardiac murmur, unspecified: Secondary | ICD-10-CM | POA: Diagnosis not present

## 2015-07-14 DIAGNOSIS — I10 Essential (primary) hypertension: Secondary | ICD-10-CM | POA: Diagnosis not present

## 2015-07-14 DIAGNOSIS — F329 Major depressive disorder, single episode, unspecified: Secondary | ICD-10-CM | POA: Diagnosis not present

## 2015-07-14 DIAGNOSIS — Z853 Personal history of malignant neoplasm of breast: Secondary | ICD-10-CM | POA: Diagnosis not present

## 2015-08-01 ENCOUNTER — Telehealth: Payer: Self-pay | Admitting: *Deleted

## 2015-08-01 NOTE — Telephone Encounter (Signed)
Received voice message from patient stating,"Dr. Dalbert Batman took me off of some medication about two weeks ago, because I was having side effects and Dr. Jana Hakim is suppose to follow up. Dr. Dalbert Batman was going to talk with Dr. Jana Hakim. Did they talk? Patient's phone has been out of order for the last two weeks. Return number is [phone].

## 2015-08-04 ENCOUNTER — Telehealth: Payer: Self-pay | Admitting: *Deleted

## 2015-08-04 NOTE — Telephone Encounter (Signed)
Called pt to discuss symptoms of anastrozole. Relate she had numbness and tingling in her hand and feet. Pt d/c on 08/01/15. Discussed that those are some common s/e from anastrozole and to not take further until she sees Dr. Jana Hakim on 10/11. Received verbal understanding. Encourage pt to call with further needs.

## 2015-08-06 ENCOUNTER — Telehealth: Payer: Self-pay

## 2015-08-06 DIAGNOSIS — Z23 Encounter for immunization: Secondary | ICD-10-CM | POA: Diagnosis not present

## 2015-08-06 NOTE — Telephone Encounter (Signed)
Returned patient's call regarding questions regarding her CT that is scheduled for 08/08/15- LVM.

## 2015-08-07 ENCOUNTER — Other Ambulatory Visit: Payer: Self-pay

## 2015-08-07 ENCOUNTER — Telehealth: Payer: Self-pay | Admitting: Oncology

## 2015-08-07 DIAGNOSIS — C50411 Malignant neoplasm of upper-outer quadrant of right female breast: Secondary | ICD-10-CM

## 2015-08-07 NOTE — Progress Notes (Signed)
Patient is to have labs prior to the CT.   Request placed to schedulers who are to notify patient.

## 2015-08-07 NOTE — Telephone Encounter (Signed)
Called patient per pof but patient already had the appointment information per pof but we did make it 12;15    anne

## 2015-08-08 ENCOUNTER — Encounter (HOSPITAL_COMMUNITY): Payer: Self-pay

## 2015-08-08 ENCOUNTER — Other Ambulatory Visit (HOSPITAL_BASED_OUTPATIENT_CLINIC_OR_DEPARTMENT_OTHER): Payer: Medicare Other

## 2015-08-08 ENCOUNTER — Ambulatory Visit (HOSPITAL_COMMUNITY)
Admission: RE | Admit: 2015-08-08 | Discharge: 2015-08-08 | Disposition: A | Discharge status: Home / Self Care | Payer: Medicare Other | Source: Ambulatory Visit | Attending: Nurse Practitioner | Admitting: Nurse Practitioner

## 2015-08-08 DIAGNOSIS — E042 Nontoxic multinodular goiter: Secondary | ICD-10-CM | POA: Diagnosis not present

## 2015-08-08 DIAGNOSIS — Z853 Personal history of malignant neoplasm of breast: Secondary | ICD-10-CM | POA: Diagnosis not present

## 2015-08-08 DIAGNOSIS — C50411 Malignant neoplasm of upper-outer quadrant of right female breast: Secondary | ICD-10-CM | POA: Diagnosis not present

## 2015-08-08 DIAGNOSIS — R911 Solitary pulmonary nodule: Secondary | ICD-10-CM | POA: Insufficient documentation

## 2015-08-08 LAB — COMPREHENSIVE METABOLIC PANEL (CC13)
ALBUMIN: 3.7 g/dL (ref 3.5–5.0)
ALT: 16 U/L (ref 0–55)
AST: 28 U/L (ref 5–34)
Alkaline Phosphatase: 62 U/L (ref 40–150)
Anion Gap: 7 mEq/L (ref 3–11)
BUN: 17.5 mg/dL (ref 7.0–26.0)
CHLORIDE: 103 meq/L (ref 98–109)
CO2: 29 meq/L (ref 22–29)
Calcium: 10.2 mg/dL (ref 8.4–10.4)
Creatinine: 0.7 mg/dL (ref 0.6–1.1)
EGFR: 72 mL/min/{1.73_m2} — AB (ref >90)
GLUCOSE: 104 mg/dL (ref 70–140)
POTASSIUM: 3.6 meq/L (ref 3.5–5.1)
SODIUM: 139 meq/L (ref 136–145)
Total Bilirubin: <0.3 mg/dL (ref 0.20–1.20)
Total Protein: 6.3 g/dL — ABNORMAL LOW (ref 6.4–8.3)

## 2015-08-08 LAB — CBC WITH DIFFERENTIAL/PLATELET
BASO%: 0.4 % (ref 0.0–2.0)
BASOS ABS: 0 10*3/uL (ref 0.0–0.1)
EOS%: 2.2 % (ref 0.0–7.0)
Eosinophils Absolute: 0.2 10*3/uL (ref 0.0–0.5)
HCT: 35.5 % (ref 34.8–46.6)
HEMOGLOBIN: 11.9 g/dL (ref 11.6–15.9)
LYMPH%: 29 % (ref 14.0–49.7)
MCH: 30.4 pg (ref 25.1–34.0)
MCHC: 33.5 g/dL (ref 31.5–36.0)
MCV: 90.6 fL (ref 79.5–101.0)
MONO#: 0.8 10*3/uL (ref 0.1–0.9)
MONO%: 10.9 % (ref 0.0–14.0)
NEUT#: 4 10*3/uL (ref 1.5–6.5)
NEUT%: 57.5 % (ref 38.4–76.8)
Platelets: 209 10*3/uL (ref 145–400)
RBC: 3.92 10*6/uL (ref 3.70–5.45)
RDW: 16.6 % — AB (ref 11.2–14.5)
WBC: 6.9 10*3/uL (ref 3.9–10.3)
lymph#: 2 10*3/uL (ref 0.9–3.3)

## 2015-08-08 MED ORDER — IOHEXOL 300 MG/ML  SOLN
75.0000 mL | Freq: Once | INTRAMUSCULAR | Status: AC | PRN
  Administered 2015-08-08: 75 mL via INTRAVENOUS

## 2015-08-12 ENCOUNTER — Telehealth: Payer: Self-pay | Admitting: Oncology

## 2015-08-12 ENCOUNTER — Ambulatory Visit (HOSPITAL_BASED_OUTPATIENT_CLINIC_OR_DEPARTMENT_OTHER): Payer: Medicare Other | Admitting: Oncology

## 2015-08-12 ENCOUNTER — Other Ambulatory Visit: Payer: Medicare Other

## 2015-08-12 VITALS — BP 159/57 | HR 65 | Temp 98.5°F | Resp 18 | Ht 65.0 in | Wt 115.8 lb

## 2015-08-12 DIAGNOSIS — C50411 Malignant neoplasm of upper-outer quadrant of right female breast: Secondary | ICD-10-CM | POA: Diagnosis not present

## 2015-08-12 DIAGNOSIS — G629 Polyneuropathy, unspecified: Secondary | ICD-10-CM | POA: Diagnosis not present

## 2015-08-12 DIAGNOSIS — H548 Legal blindness, as defined in USA: Secondary | ICD-10-CM

## 2015-08-12 DIAGNOSIS — C50911 Malignant neoplasm of unspecified site of right female breast: Secondary | ICD-10-CM | POA: Insufficient documentation

## 2015-08-12 DIAGNOSIS — Z923 Personal history of irradiation: Secondary | ICD-10-CM | POA: Diagnosis not present

## 2015-08-12 DIAGNOSIS — Z79811 Long term (current) use of aromatase inhibitors: Secondary | ICD-10-CM | POA: Diagnosis not present

## 2015-08-12 DIAGNOSIS — Z86000 Personal history of in-situ neoplasm of breast: Secondary | ICD-10-CM

## 2015-08-12 DIAGNOSIS — Z170 Estrogen receptor positive status [ER+]: Secondary | ICD-10-CM

## 2015-08-12 NOTE — Progress Notes (Signed)
ID: Guadalupe Maple OB: 1923/03/26  MR#: [id number]  FMB#:[id number]  PCP: Vidal Schwalbe, MD GYN:   SUFanny Skates OTHER MD: Kyung Rudd, Todd McDiarmid, Izora Gala, Rupinder Orene Desanctis  CHIEF COMPLAINT: Estrogen receptor positive breast cancer  CURRENT TREATMENT: anastrozole  BREAST CANCER HISTORY: From the prior summary note:  Ms. Omara has a prior history of bilateral lumpectomies, bilateral rest irradiation, and prior treatment with tamoxifen (approximately 4 and half years) and an anastrozole (unknown duration). The data is being obtained from the archives.  More recently some new calcifications were noted in the right breast, and digital right breast mammography with magnification views confirmed a cluster in the posterior third of the breast spanning an area of 1.3 cm. Repeat within 6 months was suggested, and performed 04/02/2013. The patient's breasts are heterogeneously dense. The area of 1.3 cm of microcalcifications had increased in number, and spanned 1.6 cm and this study. Accordingly biopsy was obtained 04/18/2013, and this showed ([id number]) high-grade ductal carcinoma in situ, with a 2 mm area of invasive disease. The invasive tumor was estrogen receptor 100% positive, progesterone receptor negative, with an MIB-1 of 2% and no HER-2 amplification.  The patient's subsequent history is as detailed below  INTERVAL HISTORY: Sheri returns today for follow up of her early stage breast cancer, accompanied by her  2 sons, [name] and [name]. Since her last visit here she had diagnostic mammography with tomography of the right breast 06/18/2015 to evaluate a right upper outer quadrant mass which was easily palpable. This was mammographically silent but by ultrasound it was found to measure 1.1 cm. at the 11:00 position. At the 1:00 position there was a separate 0.6 cm mass of unclear etiology.  The larger mass at the 11:00 position was biopsied on 07/02/2015. It showed (SAA  [phone]) an invasive lobular breast cancer, estrogen receptor 100% positive, progesterone receptor 95% positive, with an MIB-1 of 5% and no evidence of HER-2 amplification, the signals ratio being 1.77 and the number per cell 3.10.  Recall that her prior right-sided breast cancer was ductal in subtype.  Since her last visit here she also had a restaging CT of the chest which shows the nodule we were following to be completely stable and likely benign  She is here to discuss additional therapy  REVIEW OF SYSTEMS: Chelcey did well with her recent biopsy. She was started on anastrozole by Dr. Dalbert Batman on a postop visit. After about 3 weeks she started having tingling not only in her feet and hands but also up her left leg. This is more prominent on the left side than the right side. She stopped the medication September 30. She thinks the symptoms, which she attributed to the medication, are better, but they are still present. There is very little tingling or burning in the right hand, there is more in the left hand. There is more on the left foot than the right foot. Aside from that problem of course she is legally blind and hard of hearing. She continues to live by herself and she uses the [employer] and friends to drive her shopping for food and so on. She has problems from her spastic bladder. She describes herself is anxious and depressed. A detailed review of systems today was otherwise noncontributory  PAST MEDICAL HISTORY: Past Medical History  Diagnosis Date  . Hypertension   . Urinary incontinence   . Depression   . Anxiety   . Blindness, [place] defined in [place]  both eyes  . Heart murmur   . Cancer (HCC)     breast, skin  . Anemia     PAST SURGICAL HISTORY: Past Surgical History  Procedure Laterality Date  . Breast lumpectomy Bilateral     left 1991,right side 1994  . Abdominal hysterectomy    . Back surgery    . Eye surgery Bilateral     cat  . Tonsillectomy    .  Partial mastectomy with needle localization Right 05/21/2013    Procedure: PARTIAL MASTECTOMY WITH NEEDLE LOCALIZATION;  Surgeon: Adin Hector, MD;  Location: [facility];  Service: General;  Laterality: Right;    FAMILY HISTORY No family history on file. The patient's mother died giving birth to the patient, at age 24. The patient did not grow up with her father and has very little information regarding him or his side of the family. She believes he died in his 52s in a state hospital. The patient had no brothers or sisters.  GYNECOLOGIC HISTORY:  The patient does not recall how old she was at menarche. She thinks she went through menopause in her 60s. She is GX P2 with first live birth at age 69. The patient is status post hysterectomy but does not know if her ovaries were removed or not.  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 She uses the [employer] and other support services to get around, do her shopping, etc. Son [name] lives in [place] and works in Scientist, research (life sciences) estate. Son [name] lives in [place] and is in Economist. The patient has 3 grandchildren. She attends the first [employer] locally.    ADVANCED DIRECTIVES: In place   HEALTH MAINTENANCE: Social History  Substance Use Topics  . Smoking status: Never Smoker   . Smokeless tobacco: Not on file  . Alcohol Use: No     Colonoscopy:  PAP:  Bone density: October 2008, showing osteoporosis (left hip T score -2.6)  Lipid panel:  Allergies  Allergen Reactions  . Aspirin     Bleeding / rash  . Emete-Con [Benzquinamide]     unknown    Current Outpatient Prescriptions  Medication Sig Dispense Refill  . alendronate (FOSAMAX) 70 MG tablet Take 70 mg by mouth every 7 (seven) days.     . carvedilol (COREG) 3.125 MG tablet 1 tablet 2 (two) times daily.    . fluorometholone (FML) 0.1 % ophthalmic suspension     . FLUoxetine (PROZAC) 20  MG capsule Take 20 mg by mouth daily.     . hydrALAZINE (APRESOLINE) 25 MG tablet Take 25-50 mg by mouth 4 (four) times daily. 50 mg in the a.m. , 25 mg at noon, 50 mg at dinner, and 25 mg at bedtime    . mirtazapine (REMERON) 30 MG tablet Take 30 mg by mouth at bedtime.     . Multiple Vitamins-Minerals (ICAPS MV PO) Take by mouth 1 day or 1 dose.    . potassium chloride (KLOR-CON) 8 MEQ tablet Take 16 mEq by mouth 2 (two) times daily.    . pravastatin (PRAVACHOL) 40 MG tablet Take 40 mg by mouth at bedtime.     . prednisoLONE acetate (PRED FORTE) 1 % ophthalmic suspension Place 1 drop into the right eye 2 (two) times daily.    . timolol (TIMOPTIC) 0.5 % ophthalmic solution Place 1 drop into both eyes 2 (two) times daily.    Marland Kitchen  traMADol (ULTRAM) 50 MG tablet Take 50 mg by mouth every 6 (six) hours as needed.    [name] 40-10-25 MG TABS Take 1 tablet by mouth every morning.      No current facility-administered medications for this visit.    OBJECTIVE: Elderly white woman in no acute distress Filed Vitals:   08/12/15 1304  BP: 159/57  Pulse: 65  Temp: 98.5 F (36.9 C)  Resp: 18     Body mass index is 19.27 kg/(m^2).    ECOG FS: 1  Sclerae unicteric, white matter partially covering right cornea Oropharynx clear, slightly dry No cervical or supraclavicular adenopathy Lungs no rales or rhonchi Heart regular rate and rhythm, 2/6 systolic murmur  Abd soft, nontender, positive bowel sounds, no masses palpated MSK scoliosis but no focal spinal tenderness, no upper extremity lymphedema, bilateral straight leg raising to 60 Neuro: nonfocal and specifically 5 minus over 5 grip bilaterally, 5 over 5 dorsiflexion bilaterally, with slight alteration in sensation mid calf down on the left not elsewhere, well oriented, appropriate affect Breasts:      LAB RESULTS:  CMP     Component Value Date/Time   NA 139 08/08/2015 1219   NA 139 05/11/2013 1318   K 3.6 08/08/2015 1219   K 3.6  05/11/2013 1318   CL 99 05/11/2013 1318   CL 102 04/25/2013 1250   CO2 29 08/08/2015 1219   CO2 29 05/11/2013 1318   GLUCOSE 104 08/08/2015 1219   GLUCOSE 142* 05/11/2013 1318   GLUCOSE 120* 04/25/2013 1250   BUN 17.5 08/08/2015 1219   BUN 22 05/11/2013 1318   CREATININE 0.7 08/08/2015 1219   CREATININE 0.75 05/11/2013 1318   CALCIUM 10.2 08/08/2015 1219   CALCIUM 10.6* 05/11/2013 1318   PROT 6.3* 08/08/2015 1219   PROT 6.9 05/11/2013 1318   ALBUMIN 3.7 08/08/2015 1219   ALBUMIN 3.9 05/11/2013 1318   AST 28 08/08/2015 1219   AST 26 05/11/2013 1318   ALT 16 08/08/2015 1219   ALT 17 05/11/2013 1318   ALKPHOS 62 08/08/2015 1219   ALKPHOS 57 05/11/2013 1318   BILITOT <0.30 08/08/2015 1219   BILITOT 0.3 05/11/2013 1318   GFRNONAA 73* 05/11/2013 1318   GFRAA 85* 05/11/2013 1318    I No results found for: SPEP  Lab Results  Component Value Date   WBC 6.9 08/08/2015   NEUTROABS 4.0 08/08/2015   HGB 11.9 08/08/2015   HCT 35.5 08/08/2015   MCV 90.6 08/08/2015   PLT 209 08/08/2015      Chemistry      Component Value Date/Time   NA 139 08/08/2015 1219   NA 139 05/11/2013 1318   K 3.6 08/08/2015 1219   K 3.6 05/11/2013 1318   CL 99 05/11/2013 1318   CL 102 04/25/2013 1250   CO2 29 08/08/2015 1219   CO2 29 05/11/2013 1318   BUN 17.5 08/08/2015 1219   BUN 22 05/11/2013 1318   CREATININE 0.7 08/08/2015 1219   CREATININE 0.75 05/11/2013 1318      Component Value Date/Time   CALCIUM 10.2 08/08/2015 1219   CALCIUM 10.6* 05/11/2013 1318   ALKPHOS 62 08/08/2015 1219   ALKPHOS 57 05/11/2013 1318   AST 28 08/08/2015 1219   AST 26 05/11/2013 1318   ALT 16 08/08/2015 1219   ALT 17 05/11/2013 1318   BILITOT <0.30 08/08/2015 1219   BILITOT 0.3 05/11/2013 1318       No results found for: LABCA2  No components found  for: [id number]  No results for input(s): INR in the last 168 hours.  Urinalysis    Component Value Date/Time   COLORURINE YELLOW 05/11/2013 1317    APPEARANCEUR CLEAR 05/11/2013 1317   LABSPEC 1.014 05/11/2013 1317   PHURINE 7.5 05/11/2013 1317   GLUCOSEU NEGATIVE 05/11/2013 1317   HGBUR NEGATIVE 05/11/2013 1317   BILIRUBINUR NEGATIVE 05/11/2013 1317   KETONESUR NEGATIVE 05/11/2013 1317   PROTEINUR NEGATIVE 05/11/2013 1317   UROBILINOGEN 1.0 05/11/2013 1317   NITRITE NEGATIVE 05/11/2013 1317   LEUKOCYTESUR NEGATIVE 05/11/2013 1317    STUDIES: Ct Chest W Contrast  08/08/2015   CLINICAL DATA:  Subsequent encounter for lung nodule. Right breast cancer diagnosed 2 years ago. More remote history of left-sided breast cancer.  EXAM: CT CHEST WITH CONTRAST  TECHNIQUE: Multidetector CT imaging of the chest was performed during intravenous contrast administration.  CONTRAST:  61m OMNIPAQUE IOHEXOL 300 MG/ML  SOLN  COMPARISON:  CT scan from 03/01/2014.  Chest CT from 05/07/2013.  FINDINGS: Mediastinum / Lymph Nodes: There is no axillary lymphadenopathy. Surgical clips are noted in the axillary regions bilaterally. No mediastinal lymphadenopathy. There is no hilar lymphadenopathy. Tiny hiatal hernia noted. Esophagus otherwise unremarkable. Heart is enlarged. No pericardial effusion. Bilateral small thyroid nodules are stable.  Lungs / Pleura: No focal airspace consolidation. No pulmonary edema or pleural effusion. Interstitial disease has a peripheral predominance and changes in the anterior right lung may be related to previous radiation and are stable since prior study.  8 mm right lower lobe pulmonary nodule (image 25 series 5) is unchanged since 03/01/2014. No new pulmonary parenchymal nodule or mass.  MSK / Soft Tissues: Bones are diffusely demineralized. Nonacute posterior right eighth rib fracture noted. The left posterior lateral chest wall lesion measured on the previous study at 6.2 x 3.4 cm now measures 3.4 x 2.1 cm (image 38 series 2).  Upper Abdomen: Gallbladder is decompressed. There is dilatation of the extrahepatic common duct up to about 9  mm diameter, not substantially changed since 05/07/2013. Mild prominence of the main pancreatic duct is also unchanged. No adrenal nodule or mass.  IMPRESSION: 1. Stable 8 mm right lower lobe pulmonary nodule. This is unchanged since 05/07/2013, suggesting a benign process. 2. Left posterior lateral chest wall lesion, along the inferior tip of the left scapula, is smaller than previously although indeterminate by imaging. Given the interval decrease in size, benign etiology is the most likely consideration. 3. Stable small bilateral thyroid nodules. 4. No substantial change in extrahepatic biliary duct dilatation.   Electronically Signed   By: EMisty StanleyM.D.   On: 08/08/2015 14:12     ASSESSMENT: 81y.o. [place] woman  (1) status post remote lumpectomies to both breasts, with adjuvant radiation bilaterally, and prior history of tamoxifen and Arimidex  (2) Status post right breast biopsy 04/18/2013 for ductal carcinoma in situ, high-grade, with an area of microinvasion; the invasive cancer was estrogen receptor positive at 100%, progesterone receptor and HER-2 negative, with anMIB-1 of 2%.  (3)  status post right lumpectomy 05/21/2013 for a pT1b pnX, stage IA invasive ductal carcinoma, with negative margins(closest 1 mm from anterior margin)  (4) genetics testing discussed--patient and family prefer to forego genetic testing  (5) the patient had an 0.8 cm right lower lobe lung mass and a left posterior chest wall mass (likely hemangioma)   (a) repeat Chest CT 08/08/2015 shows the right lower lobe mass to be unchanged  (b) left posterior chest wall mass has decreased  in size c/w 03/01/2014 (6.2 cm to 3.4 cm)  (c) no further chest CT follow-up needed   (6) right breast biopsy 07/02/2015 shows a clinically T1c NX invasive lobular breast cancer,  Grade 2, strongly estrogen and progesterone receptor positive, with an MIB-1 of 5% and no HER-2 amplification   (7) anastrozole started September  2016 but discontinued by the patient because of peripheral neuropathy symptoms.    PLAN: I spent approximately 50 minutes with Heath Lark and her family going over her treatment options. She has a new documented cancer in the right breast and possibly a second area of concern, which has not yet been biopsied. One option accordingly would be mastectomy.  A second option would be observation. This is a lobular cancer and it was not seen by mammography. It was seen on ultrasonography. He would do mammography and ultrasonography every 6 months and if there is significant growth then we could reconsider the observation only option.  However the best option I think, concurring with Dr. Dalbert Batman, is that she take anti-estrogens. She tolerated anastrozole well in the past and it could well work again at this point. However she stopped it because of the new neuropathy she has developed a which is described above.  Anastrozole generally does not cause neuropathy and if it were to cause neuropathy would probably be over a prolonged period of time and not in a few weeks as was the case area did the neuropathy is a little bit better but certainly has not cleared and she has been off the medication now for about 3 weeks.  We decided that I would call her again 3 weeks from now. If she is still having neuropathy, as I expect, we will resume the anastrozole. If the neuropathy has completely cleared, we will start her in exemestane.  However I do think the neuropathy needs workup. She has met with neurology before, and I am placing a referral to Dr. Jannifer Franklin for further evaluation of that particular problem.  Otherwise probably will return to see me early December. She knows to call for any problems that may develop before that visit. Chauncey Cruel, MD   08/12/2015 1:11 PM

## 2015-08-12 NOTE — Telephone Encounter (Signed)
Gave patient avs report and appointments for December and f/u with Dr. Jannifer Franklin @ [place] 10/20 @ 8:30 am.

## 2015-08-13 ENCOUNTER — Other Ambulatory Visit: Payer: Self-pay

## 2015-08-13 DIAGNOSIS — Z1231 Encounter for screening mammogram for malignant neoplasm of breast: Secondary | ICD-10-CM

## 2015-08-18 ENCOUNTER — Encounter (HOSPITAL_COMMUNITY): Payer: Self-pay | Admitting: *Deleted

## 2015-08-18 ENCOUNTER — Emergency Department (EMERGENCY_DEPARTMENT_HOSPITAL): Payer: Medicare Other

## 2015-08-18 ENCOUNTER — Emergency Department (HOSPITAL_COMMUNITY)
Admission: EM | Admit: 2015-08-18 | Discharge: 2015-08-18 | Disposition: A | Discharge status: Home / Self Care | Payer: Medicare Other | Attending: Physician Assistant | Admitting: Physician Assistant

## 2015-08-18 ENCOUNTER — Emergency Department (HOSPITAL_COMMUNITY): Payer: Medicare Other

## 2015-08-18 DIAGNOSIS — Z862 Personal history of diseases of the blood and blood-forming organs and certain disorders involving the immune mechanism: Secondary | ICD-10-CM | POA: Diagnosis not present

## 2015-08-18 DIAGNOSIS — I10 Essential (primary) hypertension: Secondary | ICD-10-CM | POA: Diagnosis not present

## 2015-08-18 DIAGNOSIS — L03114 Cellulitis of left upper limb: Secondary | ICD-10-CM | POA: Diagnosis not present

## 2015-08-18 DIAGNOSIS — Z85828 Personal history of other malignant neoplasm of skin: Secondary | ICD-10-CM | POA: Insufficient documentation

## 2015-08-18 DIAGNOSIS — H548 Legal blindness, as defined in USA: Secondary | ICD-10-CM | POA: Insufficient documentation

## 2015-08-18 DIAGNOSIS — F329 Major depressive disorder, single episode, unspecified: Secondary | ICD-10-CM | POA: Insufficient documentation

## 2015-08-18 DIAGNOSIS — Z7952 Long term (current) use of systemic steroids: Secondary | ICD-10-CM | POA: Diagnosis not present

## 2015-08-18 DIAGNOSIS — F419 Anxiety disorder, unspecified: Secondary | ICD-10-CM | POA: Insufficient documentation

## 2015-08-18 DIAGNOSIS — Z79899 Other long term (current) drug therapy: Secondary | ICD-10-CM | POA: Insufficient documentation

## 2015-08-18 DIAGNOSIS — Z853 Personal history of malignant neoplasm of breast: Secondary | ICD-10-CM | POA: Diagnosis not present

## 2015-08-18 DIAGNOSIS — R609 Edema, unspecified: Secondary | ICD-10-CM

## 2015-08-18 DIAGNOSIS — L03119 Cellulitis of unspecified part of limb: Secondary | ICD-10-CM

## 2015-08-18 DIAGNOSIS — R011 Cardiac murmur, unspecified: Secondary | ICD-10-CM | POA: Insufficient documentation

## 2015-08-18 DIAGNOSIS — M25532 Pain in left wrist: Secondary | ICD-10-CM | POA: Diagnosis present

## 2015-08-18 DIAGNOSIS — M7989 Other specified soft tissue disorders: Secondary | ICD-10-CM | POA: Diagnosis not present

## 2015-08-18 LAB — BASIC METABOLIC PANEL
Anion gap: 11 (ref 5–15)
BUN: 14 mg/dL (ref 6–20)
CO2: 26 mmol/L (ref 22–32)
CREATININE: 0.54 mg/dL (ref 0.44–1.00)
Calcium: 9.9 mg/dL (ref 8.9–10.3)
Chloride: 97 mmol/L — ABNORMAL LOW (ref 101–111)
GFR calc Af Amer: >60 mL/min (ref >60)
GLUCOSE: 103 mg/dL — AB (ref 65–99)
Potassium: 3.6 mmol/L (ref 3.5–5.1)
SODIUM: 134 mmol/L — AB (ref 135–145)

## 2015-08-18 LAB — CBC WITH DIFFERENTIAL/PLATELET
BASOS ABS: 0 10*3/uL (ref 0.0–0.1)
Basophils Relative: 0 %
EOS ABS: 0 10*3/uL (ref 0.0–0.7)
EOS PCT: 0 %
HCT: 36 % (ref 36.0–46.0)
Hemoglobin: 12 g/dL (ref 12.0–15.0)
Lymphocytes Relative: 12 %
Lymphs Abs: 1.4 10*3/uL (ref 0.7–4.0)
MCH: 29.9 pg (ref 26.0–34.0)
MCHC: 33.3 g/dL (ref 30.0–36.0)
MCV: 89.8 fL (ref 78.0–100.0)
MONO ABS: 1.4 10*3/uL — AB (ref 0.1–1.0)
Monocytes Relative: 12 %
Neutro Abs: 8.8 10*3/uL — ABNORMAL HIGH (ref 1.7–7.7)
Neutrophils Relative %: 76 %
PLATELETS: 231 10*3/uL (ref 150–400)
RBC: 4.01 MIL/uL (ref 3.87–5.11)
RDW: 16.2 % — AB (ref 11.5–15.5)
WBC: 11.6 10*3/uL — AB (ref 4.0–10.5)

## 2015-08-18 MED ORDER — HYDROCODONE-ACETAMINOPHEN 5-325 MG PO TABS: 1.0000 | ORAL_TABLET | Freq: Four times a day (QID) | ORAL | Status: DC | PRN

## 2015-08-18 MED ORDER — MORPHINE SULFATE (PF) 4 MG/ML IV SOLN
4.0000 mg | Freq: Once | INTRAVENOUS | Status: AC
  Administered 2015-08-18: 4 mg via INTRAVENOUS
  Filled 2015-08-18: qty 1

## 2015-08-18 MED ORDER — ONDANSETRON HCL 4 MG/2ML IJ SOLN
4.0000 mg | Freq: Once | INTRAMUSCULAR | Status: AC
  Administered 2015-08-18: 4 mg via INTRAVENOUS
  Filled 2015-08-18: qty 2

## 2015-08-18 MED ORDER — CLINDAMYCIN HCL 300 MG PO CAPS: 300.0000 mg | ORAL_CAPSULE | Freq: Three times a day (TID) | ORAL | Status: DC

## 2015-08-18 NOTE — ED Notes (Signed)
Called patient's son for him to pick her up.  Waiting for phone call from son.

## 2015-08-18 NOTE — ED Notes (Signed)
UNABLE TO COLLECT LABS PATIENT WENT TO THE BATHROOM.

## 2015-08-18 NOTE — ED Notes (Addendum)
Pt and family reports pt woke up with left wrist swelling, pain, and numbness. Radial pulse present. Can wiggle fingers some. Denies trauma or injury. Pain 8/10, doesn't given an exact number just reports a lot of pain.   Pt has white spot on right eye which family reports is chronic.

## 2015-08-18 NOTE — Progress Notes (Signed)
VASCULAR LAB PRELIMINARY  PRELIMINARY  PRELIMINARY  PRELIMINARY  Left upper extremity venous duplex completed.    Preliminary report:  Left :  No evidence of DVT or superficial thrombosis.    Jamesetta Greenhalgh, RVS 08/18/2015, 3:04 PM

## 2015-08-18 NOTE — ED Notes (Signed)
Placed patient on 2 L of O2 due to her O2 level was around 89-90 per prior nurse.

## 2015-08-18 NOTE — ED Notes (Signed)
Son Alejandra Grant : [phone].  Call when needs ride home.

## 2015-08-18 NOTE — ED Provider Notes (Signed)
CSN: [id number]     Arrival date & time 08/18/15  1103 History   First MD Initiated Contact with Patient 08/18/15 1123     Chief Complaint  Patient presents with  . Left Wrist Pain      (Consider location/radiation/quality/duration/timing/severity/associated sxs/prior Treatment) HPI Comments: Pt comes in with c/o left wrist pain and numbness. She states that she has not had any injury. She was told by oncology that she likely had neuropathy. She was not having swelling and redness to the area, but she has started in the last 2 days. She tried ultram for the symptoms without relief. Denies history of a clot. Denies cp and sob.   The history is provided by the patient. No language interpreter was used.    Past Medical History  Diagnosis Date  . Hypertension   . Urinary incontinence   . Depression   . Anxiety   . 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 Heart murmur   . Cancer (HCC)     breast, skin  . Anemia    Past Surgical History  Procedure Laterality Date  . Breast lumpectomy Bilateral     left 1991,right side 1994  . Abdominal hysterectomy    . Back surgery    . Eye surgery Bilateral     cat  . Tonsillectomy    . Partial mastectomy with needle localization Right 05/21/2013    Procedure: PARTIAL MASTECTOMY WITH NEEDLE LOCALIZATION;  Surgeon: Adin Hector, MD;  Location: [facility];  Service: General;  Laterality: Right;   History reviewed. No pertinent family history. Social History  Substance Use Topics  . Smoking status: Never Smoker   . Smokeless tobacco: None  . Alcohol Use: No   OB History    No data available     Review of Systems  All other systems reviewed and are negative.     Allergies  Aspirin and Emete-con  Home Medications   Prior to Admission medications   Medication Sig Start Date End Date Taking? Authorizing Provider  alendronate (FOSAMAX) 70 MG tablet Take 70 mg by mouth every 7 (seven) days.  02/23/13  Yes Historical  Provider, MD  carvedilol (COREG) 3.125 MG tablet 1 tablet 2 (two) times daily. 06/10/14  Yes Historical Provider, MD  FLUoxetine (PROZAC) 20 MG capsule Take 20 mg by mouth daily.  04/19/13  Yes Historical Provider, MD  hydrALAZINE (APRESOLINE) 25 MG tablet Take 50 mg by mouth 4 (four) times daily.    Yes Historical Provider, MD  mirtazapine (REMERON) 30 MG tablet Take 30 mg by mouth at bedtime.  01/24/13  Yes Historical Provider, MD  potassium chloride (KLOR-CON) 8 MEQ tablet Take 16 mEq by mouth 2 (two) times daily.   Yes Historical Provider, MD  pravastatin (PRAVACHOL) 40 MG tablet Take 40 mg by mouth at bedtime.    Yes Historical Provider, MD  timolol (TIMOPTIC) 0.5 % ophthalmic solution Place 1 drop into both eyes 2 (two) times daily.   Yes Historical Provider, MD  TRIBENZOR 40-10-25 MG TABS Take 1 tablet by mouth every morning.  01/26/13  Yes Historical Provider, MD  fluorometholone (FML) 0.1 % ophthalmic suspension  04/08/15   Historical Provider, MD  Multiple Vitamins-Minerals (ICAPS MV PO) Take by mouth 1 day or 1 dose.    Historical Provider, MD  prednisoLONE acetate (PRED FORTE) 1 % ophthalmic suspension Place 1 drop into the right eye 2 (two) times daily.    Historical  Provider, MD  traMADol (ULTRAM) 50 MG tablet Take 50 mg by mouth every 6 (six) hours as needed.    Historical Provider, MD   BP 150/81 mmHg  Pulse 83  Temp(Src) 99.2 F (37.3 C) (Oral)  Resp 18  SpO2 98% Physical Exam  Constitutional: She is oriented to person, place, and time. She appears well-developed and well-nourished.  Cardiovascular: Normal rate and regular rhythm.   Pulmonary/Chest: Effort normal and breath sounds normal.  Musculoskeletal: Normal range of motion.  Obvious swelling and redness noted to the left wrist. With mild redness going up the arm on the medial aspect. Pulses intact. Full rom  Neurological: She is alert and oriented to person, place, and time.  Psychiatric: She has a normal mood and affect.   Nursing note and vitals reviewed.   ED Course  Procedures (including critical care time) Labs Review Labs Reviewed  BASIC METABOLIC PANEL - Abnormal; Notable for the following:    Sodium 134 (*)    Chloride 97 (*)    Glucose, Bld 103 (*)    All other components within normal limits  CBC WITH DIFFERENTIAL/PLATELET - Abnormal; Notable for the following:    WBC 11.6 (*)    RDW 16.2 (*)    Neutro Abs 8.8 (*)    Monocytes Absolute 1.4 (*)    All other components within normal limits    Imaging Review Dg Wrist Complete Left  08/18/2015  CLINICAL DATA:  Numbness pain swelling left wrist no injury EXAM: LEFT WRIST - COMPLETE 3+ VIEW COMPARISON:  None. FINDINGS: Mild arthritic change involving the articulation between the scaphoid and the trapezium and trapezoid. Calcification of the triangular fibrocartilage. Increased scapholunate distance suggesting tear of the scapholunate ligament. No fracture or dislocation. Mild arthritic change first carpal metacarpal joint. IMPRESSION: Degenerative changes with no acute findings. Electronically Signed   By: Skipper Cliche M.D.   On: 08/18/2015 12:12   I have personally reviewed and evaluated these images and lab results as part of my medical decision-making.   EKG Interpretation None      MDM   Final diagnoses:  Swelling  Cellulitis of wrist    No sign of dvt. Don't think that septic joint. Will send home on clindamycin. Will switch from ultram to hydrocodone. Discussed return precautions with pt and family    Glendell Docker, NP 08/18/15 1531  Courteney Julio Alm, MD 08/18/15 1655

## 2015-08-18 NOTE — Discharge Instructions (Signed)
Don't take the ultram take this medication for the pain. If the redness worsens or continues you need to follow up with your doctor Cellulitis Cellulitis is an infection of the skin and the tissue beneath it. The infected area is usually red and tender. Cellulitis occurs most often in the arms and lower legs.  CAUSES  Cellulitis is caused by bacteria that enter the skin through cracks or cuts in the skin. The most common types of bacteria that cause cellulitis are staphylococci and streptococci. SIGNS AND SYMPTOMS   Redness and warmth.  Swelling.  Tenderness or pain.  Fever. DIAGNOSIS  Your health care provider can usually determine what is wrong based on a physical exam. Blood tests may also be done. TREATMENT  Treatment usually involves taking an antibiotic medicine. HOME CARE INSTRUCTIONS   Take your antibiotic medicine as directed by your health care provider. Finish the antibiotic even if you start to feel better.  Keep the infected arm or leg elevated to reduce swelling.  Apply a warm cloth to the affected area up to 4 times per day to relieve pain.  Take medicines only as directed by your health care provider.  Keep all follow-up visits as directed by your health care provider. SEEK MEDICAL CARE IF:   You notice red streaks coming from the infected area.  Your red area gets larger or turns dark in color.  Your bone or joint underneath the infected area becomes painful after the skin has healed.  Your infection returns in the same area or another area.  You notice a swollen bump in the infected area.  You develop new symptoms.  You have a fever. SEEK IMMEDIATE MEDICAL CARE IF:   You feel very sleepy.  You develop vomiting or diarrhea.  You have a general ill feeling (malaise) with muscle aches and pains.   This information is not intended to replace advice given to you by your health care provider. Make sure you discuss any questions you have with your health  care provider.   Document Released: 07/28/2005 Document Revised: 07/09/2015 Document Reviewed: 01/03/2012 Elsevier Interactive Patient Education [employer].

## 2015-08-21 ENCOUNTER — Encounter: Payer: Self-pay | Admitting: Neurology

## 2015-08-21 ENCOUNTER — Ambulatory Visit (INDEPENDENT_AMBULATORY_CARE_PROVIDER_SITE_OTHER): Payer: Medicare Other | Admitting: Neurology

## 2015-08-21 VITALS — BP 173/68 | HR 61 | Ht 61.0 in | Wt 117.5 lb

## 2015-08-21 DIAGNOSIS — E538 Deficiency of other specified B group vitamins: Secondary | ICD-10-CM

## 2015-08-21 DIAGNOSIS — R5382 Chronic fatigue, unspecified: Secondary | ICD-10-CM | POA: Diagnosis not present

## 2015-08-21 DIAGNOSIS — R202 Paresthesia of skin: Secondary | ICD-10-CM

## 2015-08-21 HISTORY — DX: Paresthesia of skin: R20.2

## 2015-08-21 NOTE — Progress Notes (Signed)
Reason for visit: Paresthesias  Referring physician: Dr. Orrin Brigham is a 81 y.o. female  History of present illness:  Alejandra Grant is a 81 year old right-handed white female with a history of breast cancer. The patient has been on hormonal therapy, she was placed on anastrozole in September 2016. Shortly thereafter, the patient has developed some persistent paresthesias of the hands and feet. The patient has gone off of the medication, but the sensations continued. In retrospect, the patient believes that she has had some intermittent symptoms of the feet and possibly the hands for 1-2 years prior to the onset of these symptoms. Since the onset of the sensory symptoms, she has had some worsening of balance, and she is dropping things out of her hands. The patient uses a cane for ambulation, she denies any actual falls. She does have chronic issues with control of the bladder. She denies any weakness of extremities. More recently, she developed some discomfort in the left arm up to the shoulder, she went to the emergency room, she was told that she had cellulitis and she has been treated for this with good improvement of the pain. The patient denies any neck pain or neck discomfort, or shock sensations down arms or legs. She denies any headaches or dizziness. She was seen through this office 5 years ago with problems with balance. Workup at that time was relatively unremarkable. The patient is sent for further evaluation.  Past Medical History  Diagnosis Date  . Hypertension   . Urinary incontinence   . Depression   . Anxiety   . 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 Heart murmur   . Cancer (HCC)     breast, skin  . Anemia   . Paresthesia 08/21/2015    Past Surgical History  Procedure Laterality Date  . Breast lumpectomy Bilateral     left 1991,right side 1994  . Abdominal hysterectomy    . Back surgery    . Eye surgery Bilateral     cat  .  Tonsillectomy    . Partial mastectomy with needle localization Right 05/21/2013    Procedure: PARTIAL MASTECTOMY WITH NEEDLE LOCALIZATION;  Surgeon: Adin Hector, MD;  Location: [facility];  Service: General;  Laterality: Right;    History reviewed. No pertinent family history.  Social history:  reports that she has never smoked. She has never used smokeless tobacco. She reports that she does not drink alcohol or use illicit drugs.  Medications:  Prior to Admission medications   Medication Sig Start Date End Date Taking? Authorizing Provider  alendronate (FOSAMAX) 70 MG tablet Take 70 mg by mouth every 7 (seven) days.  02/23/13  Yes Historical Provider, MD  CALCIUM PO Take 1 tablet by mouth daily.   Yes Historical Provider, MD  carvedilol (COREG) 3.125 MG tablet 1 tablet 2 (two) times daily. 06/10/14  Yes Historical Provider, MD  clindamycin (CLEOCIN) 300 MG capsule Take 1 capsule (300 mg total) by mouth 3 (three) times daily. 08/18/15  Yes Glendell Docker, NP  FLUoxetine (PROZAC) 20 MG capsule Take 20 mg by mouth daily.  04/19/13  Yes Historical Provider, MD  FLUZONE HIGH-DOSE 0.5 ML SUSY Inject 1 Dose into the muscle once. 08/06/15  Yes Historical Provider, MD  hydrALAZINE (APRESOLINE) 25 MG tablet Take 50 mg by mouth 4 (four) times daily.    Yes Historical Provider, MD  mirtazapine (REMERON) 30 MG tablet Take 30 mg by  mouth at bedtime.  01/24/13  Yes Historical Provider, MD  Multiple Vitamins-Minerals (ICAPS MV PO) Take by mouth 1 day or 1 dose.   Yes Historical Provider, MD  potassium chloride (KLOR-CON) 8 MEQ tablet Take 16 mEq by mouth 2 (two) times daily.   Yes Historical Provider, MD  pravastatin (PRAVACHOL) 40 MG tablet Take 40 mg by mouth at bedtime.    Yes Historical Provider, MD  prednisoLONE acetate (PRED FORTE) 1 % ophthalmic suspension Place 1 drop into the right eye 2 (two) times daily.   Yes Historical Provider, MD  rOPINIRole (REQUIP) 1 MG tablet Take 1 mg by mouth at bedtime.  07/25/15  Yes Historical Provider, MD  timolol (TIMOPTIC) 0.5 % ophthalmic solution Place 1 drop into both eyes 2 (two) times daily.   Yes Historical Provider, MD  traMADol (ULTRAM) 50 MG tablet Take 50-100 mg by mouth every 6 (six) hours as needed for moderate pain.    Yes Historical Provider, MD  TRIBENZOR 40-10-25 MG TABS Take 1 tablet by mouth every morning.  01/26/13  Yes Historical Provider, MD  anastrozole (ARIMIDEX) 1 MG tablet Take 1 mg by mouth daily. 07/14/15   Historical Provider, MD      Allergies  Allergen Reactions  . Aspirin     Bleeding / rash  . Emete-Con [Benzquinamide]     unknown    ROS:  Out of a complete 14 system review of symptoms, the patient complains only of the following symptoms, and all other reviewed systems are negative.  Joint swelling Numbness, dizziness  Blood pressure 173/68, pulse 61, height 5\' 1"  (1.549 m), weight 117 lb 8 oz (53.298 kg).  Physical Exam  General: The patient is alert and cooperative at the time of the examination.  Eyes: Pupils are equal, round, and reactive to light on the left. Discs are flat on the left, the patient is blind on the right.  Neck: The neck is supple, no carotid bruits are noted.  Respiratory: The respiratory examination is clear.  Cardiovascular: The cardiovascular examination reveals a regular rate and rhythm, no obvious murmurs or rubs are noted.  Skin: Extremities are without significant edema.  Neurologic Exam  Mental status: The patient is alert and oriented x 3 at the time of the examination. The patient has apparent normal recent and remote memory, with an apparently normal attention span and concentration ability.  Cranial nerves: Facial symmetry is present. There is good sensation of the face to pinprick and soft touch bilaterally. The strength of the facial muscles and the muscles to head turning and shoulder shrug are normal bilaterally. Speech is well enunciated, no aphasia or dysarthria is  noted. Extraocular movements are full. Visual fields are full. The tongue is midline, and the patient has symmetric elevation of the soft palate. No obvious hearing deficits are noted. A jaw tremor is present.  Motor: The motor testing reveals 5 over 5 strength of all 4 extremities. Good symmetric motor tone is noted throughout.  Sensory: Sensory testing is intact to pinprick, soft touch, vibration sensation, and position sense on all 4 extremities. No stocking pattern pinprick sensory deficit is seen. No evidence of extinction is noted.  Coordination: Cerebellar testing reveals good finger-nose-finger and heel-to-shin bilaterally.  Gait and station: Gait is slightly wide-based. Tandem gait was not attempted. The patient uses a cane for ambulation. Romberg is negative. No drift is seen.  Reflexes: Deep tendon reflexes are symmetric and normal bilaterally, with exception that the ankle jerk reflexes are  depressed bilaterally. Toes are downgoing bilaterally.   Assessment/Plan:  1. Paresthesias, all 4 extremities  2. Mild gait disturbance  The patient reports intermittent symptoms of the extremities dating back one to 2 years, with onset of more persistent symptoms in September 2016. The patient reports onset of paresthesias in the hands and the feet at the same time. This pattern would be unusual for a true peripheral neuropathy. The anastrozole is generally not associated with a peripheral neuropathy. The patient will need to be evaluated for spinal cord pathology, we will investigate for metabolic issues such as vitamin B12 or copper deficiency, if this is unremarkable, a scan of the cervical spine may be required. If the patient does have spinal cord compression, she may not be a good surgical candidate secondary to advanced age. Blood work will be done today.  Jill Alexanders MD 08/21/2015 9:02 PM  [facility] [address] [place], [place]  [address]  Phone [phone] Fax [phone]

## 2015-08-23 LAB — TSH: TSH: 2.91 u[IU]/mL (ref 0.450–4.500)

## 2015-08-23 LAB — ANGIOTENSIN CONVERTING ENZYME: ANGIO CONVERT ENZYME: 50 U/L (ref 14–82)

## 2015-08-23 LAB — COPPER, SERUM: COPPER: 170 ug/dL — AB (ref 72–166)

## 2015-08-23 LAB — SEDIMENTATION RATE: SED RATE: 7 mm/h (ref 0–40)

## 2015-08-23 LAB — VITAMIN B12: Vitamin B-12: 352 pg/mL (ref 211–946)

## 2015-08-25 ENCOUNTER — Telehealth: Payer: Self-pay | Admitting: Neurology

## 2015-08-25 DIAGNOSIS — R202 Paresthesia of skin: Secondary | ICD-10-CM

## 2015-08-25 NOTE — Telephone Encounter (Signed)
I called the patient. The blood work was relatively unremarkable. There is no source of the paresthesias all fours. I will get MRI evaluation of the cervical spine. The patient does report a history of restless leg syndrome. Nerve conduction studies in the future may be done.

## 2015-08-26 ENCOUNTER — Telehealth: Payer: Self-pay | Admitting: Neurology

## 2015-08-26 NOTE — Telephone Encounter (Signed)
I tried to call the patient. Phone just kept ringing. No voicemail set up.

## 2015-08-26 NOTE — Telephone Encounter (Signed)
Pt called wondering about her MRI. She states that no one has called her , but she would rather wait and think about it for a little while. May call [phone]

## 2015-08-27 NOTE — Telephone Encounter (Signed)
I called the patient. She does not wish to have a MRI at this time. I advised that she call our office if she changes her mind.

## 2015-09-11 ENCOUNTER — Other Ambulatory Visit: Payer: Self-pay | Admitting: Oncology

## 2015-09-11 ENCOUNTER — Telehealth: Payer: Self-pay

## 2015-09-11 NOTE — Telephone Encounter (Signed)
To resume anastrozole; she sees me DEC 16, she can tell me then how she did with this "second try"

## 2015-09-11 NOTE — Telephone Encounter (Signed)
Pt reports that per MD order she stopped Anastrazole d/t tingling in her hands and feet.  Pt saw neurologist Dr. Jannifer Rafiel Mecca on 10/20, who recommended a MRI.  Pt reports her symptoms have nearly resolved and wants to know if she should start back on the anastrazole.  Let pt know I would notify MD and desk nurse would call he back with instructions.  Routed to [facility] pod 2 for further action.

## 2015-09-15 ENCOUNTER — Other Ambulatory Visit: Payer: Self-pay | Admitting: *Deleted

## 2015-09-15 ENCOUNTER — Other Ambulatory Visit: Payer: Self-pay | Admitting: Oncology

## 2015-09-15 ENCOUNTER — Ambulatory Visit
Admission: RE | Admit: 2015-09-15 | Discharge: 2015-09-15 | Disposition: A | Discharge status: Home / Self Care | Payer: Medicare Other | Source: Ambulatory Visit

## 2015-09-15 ENCOUNTER — Other Ambulatory Visit: Payer: Self-pay

## 2015-09-15 ENCOUNTER — Telehealth: Payer: Self-pay

## 2015-09-15 DIAGNOSIS — C50411 Malignant neoplasm of upper-outer quadrant of right female breast: Secondary | ICD-10-CM

## 2015-09-15 DIAGNOSIS — C50911 Malignant neoplasm of unspecified site of right female breast: Secondary | ICD-10-CM | POA: Diagnosis not present

## 2015-09-15 DIAGNOSIS — Z853 Personal history of malignant neoplasm of breast: Secondary | ICD-10-CM

## 2015-09-15 DIAGNOSIS — Z1231 Encounter for screening mammogram for malignant neoplasm of breast: Secondary | ICD-10-CM

## 2015-09-15 MED ORDER — ANASTROZOLE 1 MG PO TABS: 1.0000 mg | ORAL_TABLET | Freq: Every day | ORAL | Status: DC

## 2015-09-15 NOTE — Telephone Encounter (Signed)
Patient called today stating that she saw a neurologist and he feels that she does not have neuropathy.  He would like patient to have an MRI.  Patient has not decided whether she wants to have this test.  The neurologist per patient strongly feels that the anastrozole is not causing numbness in her hands and feet, he does not feel that it is medications induced.  She does admit that the numbness now comes and goes however it has improved since going off the anastrozole.  She is taking the neurologists advice and is going back on the anastrozole today.  Patient is requesting a new prescription be called into her [facility] mail order pharmacy.  Writer called in the prescription #90 x's 3 refills. Patient to see Dr. Jana Hakim on 10/21/15.  Patient encouraged to call is the numbness in her hands and feet worsens again.

## 2015-09-16 ENCOUNTER — Other Ambulatory Visit: Payer: Self-pay | Admitting: Oncology

## 2015-10-02 ENCOUNTER — Telehealth: Payer: Self-pay | Admitting: Neurology

## 2015-10-02 NOTE — Telephone Encounter (Signed)
I called the patient. I explained that the MRI would be of the neck area. I explained that it is looking for any abnormalities that may be causing her neuropathy. She verbalized understanding and thanked me for calling. She stated she is still thinking about it and will call to let us know if she decides to go forward with the MRI.

## 2015-10-02 NOTE — Telephone Encounter (Signed)
Patient is calling. She says Dr. Jannifer Franklin wants her to have an MRI and she wants to know what he is looking for if she does have the MRI. Please call and discuss. Thank you.

## 2015-10-03 NOTE — Telephone Encounter (Signed)
Pt called sts she was unaware she had a neuropathy is inquiring if Dr Jannifer Franklin thinks she has a neuropathy. Please call and advise

## 2015-10-06 NOTE — Telephone Encounter (Signed)
I called the patient. I advised that Dr. Jannifer Franklin is trying to diagnose neuropathy or rule it out. He wants to get a MRI to look for the cause of the numbness she is having. The patient thanked me for calling.

## 2015-10-21 ENCOUNTER — Ambulatory Visit (HOSPITAL_BASED_OUTPATIENT_CLINIC_OR_DEPARTMENT_OTHER): Payer: Medicare Other | Admitting: Oncology

## 2015-10-21 ENCOUNTER — Other Ambulatory Visit (HOSPITAL_BASED_OUTPATIENT_CLINIC_OR_DEPARTMENT_OTHER): Payer: Medicare Other

## 2015-10-21 VITALS — BP 177/50 | HR 64 | Temp 98.0°F | Resp 18 | Ht 61.0 in | Wt 117.3 lb

## 2015-10-21 DIAGNOSIS — M810 Age-related osteoporosis without current pathological fracture: Secondary | ICD-10-CM | POA: Diagnosis not present

## 2015-10-21 DIAGNOSIS — C50411 Malignant neoplasm of upper-outer quadrant of right female breast: Secondary | ICD-10-CM

## 2015-10-21 DIAGNOSIS — C50911 Malignant neoplasm of unspecified site of right female breast: Secondary | ICD-10-CM

## 2015-10-21 DIAGNOSIS — R918 Other nonspecific abnormal finding of lung field: Secondary | ICD-10-CM

## 2015-10-21 LAB — COMPREHENSIVE METABOLIC PANEL
ALT: 19 U/L (ref 0–55)
AST: 29 U/L (ref 5–34)
Albumin: 3.7 g/dL (ref 3.5–5.0)
Alkaline Phosphatase: 87 U/L (ref 40–150)
Anion Gap: 8 mEq/L (ref 3–11)
BUN: 19.1 mg/dL (ref 7.0–26.0)
CHLORIDE: 102 meq/L (ref 98–109)
CO2: 28 mEq/L (ref 22–29)
Calcium: 10.3 mg/dL (ref 8.4–10.4)
Creatinine: 0.8 mg/dL (ref 0.6–1.1)
EGFR: 69 mL/min/{1.73_m2} — ABNORMAL LOW (ref >90)
GLUCOSE: 110 mg/dL (ref 70–140)
POTASSIUM: 4.1 meq/L (ref 3.5–5.1)
SODIUM: 138 meq/L (ref 136–145)
Total Bilirubin: 0.3 mg/dL (ref 0.20–1.20)
Total Protein: 6.9 g/dL (ref 6.4–8.3)

## 2015-10-21 LAB — CBC WITH DIFFERENTIAL/PLATELET
BASO%: 0.8 % (ref 0.0–2.0)
BASOS ABS: 0.1 10*3/uL (ref 0.0–0.1)
EOS%: 2 % (ref 0.0–7.0)
Eosinophils Absolute: 0.2 10*3/uL (ref 0.0–0.5)
HCT: 37 % (ref 34.8–46.6)
HEMOGLOBIN: 12.1 g/dL (ref 11.6–15.9)
LYMPH%: 19.6 % (ref 14.0–49.7)
MCH: 29.2 pg (ref 25.1–34.0)
MCHC: 32.7 g/dL (ref 31.5–36.0)
MCV: 89.4 fL (ref 79.5–101.0)
MONO#: 1 10*3/uL — ABNORMAL HIGH (ref 0.1–0.9)
MONO%: 10.6 % (ref 0.0–14.0)
NEUT#: 6 10*3/uL (ref 1.5–6.5)
NEUT%: 67 % (ref 38.4–76.8)
Platelets: 264 10*3/uL (ref 145–400)
RBC: 4.14 10*6/uL (ref 3.70–5.45)
RDW: 16.4 % — AB (ref 11.2–14.5)
WBC: 9 10*3/uL (ref 3.9–10.3)
lymph#: 1.8 10*3/uL (ref 0.9–3.3)

## 2015-10-21 MED ORDER — ANASTROZOLE 1 MG PO TABS: 1.0000 mg | ORAL_TABLET | Freq: Every day | ORAL | Status: DC

## 2015-10-21 NOTE — Progress Notes (Signed)
ID: Alejandra Grant OB: 1923-04-08  MR#: [id number]  WUJ#:[id number]  PCP: Vidal Schwalbe, MD GYN:   SUFanny Grant OTHER MD: Alejandra Grant, Alejandra Grant, Alejandra Grant, Alejandra Grant  CHIEF COMPLAINT: Estrogen receptor positive breast cancer  CURRENT TREATMENT: anastrozole  BREAST CANCER HISTORY: From the prior summary note:  Alejandra Grant has a prior history of bilateral lumpectomies, bilateral rest irradiation, and prior treatment with tamoxifen (approximately 4 and half years) and an anastrozole (unknown duration). The data is being obtained from the archives.  More recently some new calcifications were noted in the right breast, and digital right breast mammography with magnification views confirmed a cluster in the posterior third of the breast spanning an area of 1.3 cm. Repeat within 6 months was suggested, and performed 04/02/2013. The patient's breasts are heterogeneously dense. The area of 1.3 cm of microcalcifications had increased in number, and spanned 1.6 cm and this study. Accordingly biopsy was obtained 04/18/2013, and this showed ([id number]) high-grade ductal carcinoma in situ, with a 2 mm area of invasive disease. The invasive tumor was estrogen receptor 100% positive, progesterone receptor negative, with an MIB-1 of 2% and no HER-2 amplification.  The patient's subsequent history is as detailed below  INTERVAL HISTORY: Alejandra Grant returns today for follow up of her estrogen receptor positive breast cancer, accompanied by her son Alejandra Grant. After the last visit here she went back on anastrozole and has been tolerating that well. Hot flashes are not a major problem. She does have vaginal dryness but it is not worse than before. She has not experienced any arthralgias or myalgias that many patients can get with this drug. She obtains that approximately $4 per month  REVIEW OF SYSTEMS: Alejandra Grant also met with Dr. Jannifer Grant since her last visit here. He tells me that he was not  sure why she would be having neuropathy symptoms. He suggested a spinal MRI, which she is reluctant to have. It is very difficult for her to lie still in that confined space. Also she feels perhaps the neuropathy is a little bit better. She does have some numbness in the left hand particularly early in the morning right after waking. Otherwise a detailed review of systems currently is stable  PAST MEDICAL HISTORY: Past Medical History  Diagnosis Date  . Hypertension   . Urinary incontinence   . Depression   . Anxiety   . 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 Heart murmur   . Cancer (HCC)     breast, skin  . Anemia   . Paresthesia 08/21/2015    PAST SURGICAL HISTORY: Past Surgical History  Procedure Laterality Date  . Breast lumpectomy Bilateral     left 1991,right side 1994  . Abdominal hysterectomy    . Back surgery    . Eye surgery Bilateral     cat  . Tonsillectomy    . Partial mastectomy with needle localization Right 05/21/2013    Procedure: PARTIAL MASTECTOMY WITH NEEDLE LOCALIZATION;  Surgeon: Alejandra Hector, MD;  Location: [facility];  Service: General;  Laterality: Right;    FAMILY HISTORY No family history on file. The patient's mother died giving birth to the patient, at age 45. The patient did not grow up with her father and has very little information regarding him or his side of the family. She believes he died in his 15s in a state hospital. The patient had no brothers or sisters.  GYNECOLOGIC HISTORY:  The patient does not recall how old she was at menarche. She thinks she went through menopause in her 4s. She is GX P2 with first live birth at age 61. The patient is status post hysterectomy but does not know if her ovaries were removed or not.  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 She uses the [employer] and other support services to get around, do her shopping, etc. Son Alejandra Grant lives  in [place] and works in Scientist, research (life sciences) estate. Son Alejandra Grant lives in [place] and is in Economist. The patient has 3 grandchildren. She attends the first [employer] locally.    ADVANCED DIRECTIVES: In place   HEALTH MAINTENANCE: Social History  Substance Use Topics  . Smoking status: Never Smoker   . Smokeless tobacco: Never Used  . Alcohol Use: No     Colonoscopy:  PAP:  Bone density: October 2008, showing osteoporosis (left hip T score -2.6)  Lipid panel:  Allergies  Allergen Reactions  . Aspirin     Bleeding / rash  . Emete-Con [Benzquinamide]     unknown    Current Outpatient Prescriptions  Medication Sig Dispense Refill  . alendronate (FOSAMAX) 70 MG tablet Take 70 mg by mouth every 7 (seven) days.     Marland Kitchen anastrozole (ARIMIDEX) 1 MG tablet Take 1 tablet (1 mg total) by mouth daily. 90 tablet 3  . CALCIUM PO Take 1 tablet by mouth daily.    . carvedilol (COREG) 3.125 MG tablet 1 tablet 2 (two) times daily.    . clindamycin (CLEOCIN) 300 MG capsule Take 1 capsule (300 mg total) by mouth 3 (three) times daily. 21 capsule 0  . FLUoxetine (PROZAC) 20 MG capsule Take 20 mg by mouth daily.     Marland Kitchen FLUZONE HIGH-DOSE 0.5 ML SUSY Inject 1 Dose into the muscle once.  0  . hydrALAZINE (APRESOLINE) 25 MG tablet Take 50 mg by mouth 4 (four) times daily.     . mirtazapine (REMERON) 30 MG tablet Take 30 mg by mouth at bedtime.     . Multiple Vitamins-Minerals (ICAPS MV PO) Take by mouth 1 day or 1 dose.    . potassium chloride (KLOR-CON) 8 MEQ tablet Take 16 mEq by mouth 2 (two) times daily.    . pravastatin (PRAVACHOL) 40 MG tablet Take 40 mg by mouth at bedtime.     . prednisoLONE acetate (PRED FORTE) 1 % ophthalmic suspension Place 1 drop into the right eye 2 (two) times daily.    Marland Kitchen rOPINIRole (REQUIP) 1 MG tablet Take 1 mg by mouth at bedtime.    . timolol (TIMOPTIC) 0.5 % ophthalmic solution Place 1 drop into both eyes 2 (two) times daily.    .  traMADol (ULTRAM) 50 MG tablet Take 50-100 mg by mouth every 6 (six) hours as needed for moderate pain.     Marland Kitchen TRIBENZOR 40-10-25 MG TABS Take 1 tablet by mouth every morning.      No current facility-administered medications for this visit.    OBJECTIVE: Elderly white woman who appears stated age 51 Vitals:   10/21/15 1218 10/21/15 1219  BP: 186/58 177/50  Pulse: 64   Temp: 98 F (36.7 C)   Resp: 18      Body mass index is 22.18 kg/(m^2).    ECOG FS: 1  Sclerae unicteric, right keratopathy as before Oropharynx clear, slightly dry No cervical or supraclavicular adenopathy Lungs  no rales or rhonchi Heart regular rate and rhythm Abd soft, nontender, positive bowel sounds MSK no focal spinal tenderness, no upper extremity lymphedema Neuro: nonfocal, well oriented, appropriate affect Breasts: The right breast is moderately lumpy for an elderly woman, but there is no suggestion of disease progression, no erythema, and no skin or nipple changes of concern. The right axilla is benign. The left breast is status post remote lumpectomy. There is no evidence of local recurrence. Left axilla is benign.    LAB RESULTS:  CMP     Component Value Date/Time   NA 134* 08/18/2015 1230   NA 139 08/08/2015 1219   K 3.6 08/18/2015 1230   K 3.6 08/08/2015 1219   CL 97* 08/18/2015 1230   CL 102 04/25/2013 1250   CO2 26 08/18/2015 1230   CO2 29 08/08/2015 1219   GLUCOSE 103* 08/18/2015 1230   GLUCOSE 104 08/08/2015 1219   GLUCOSE 120* 04/25/2013 1250   BUN 14 08/18/2015 1230   BUN 17.5 08/08/2015 1219   CREATININE 0.54 08/18/2015 1230   CREATININE 0.7 08/08/2015 1219   CALCIUM 9.9 08/18/2015 1230   CALCIUM 10.2 08/08/2015 1219   PROT 6.3* 08/08/2015 1219   PROT 6.9 05/11/2013 1318   ALBUMIN 3.7 08/08/2015 1219   ALBUMIN 3.9 05/11/2013 1318   AST 28 08/08/2015 1219   AST 26 05/11/2013 1318   ALT 16 08/08/2015 1219   ALT 17 05/11/2013 1318   ALKPHOS 62 08/08/2015 1219   ALKPHOS 57  05/11/2013 1318   BILITOT <0.30 08/08/2015 1219   BILITOT 0.3 05/11/2013 1318   GFRNONAA >60 08/18/2015 1230   GFRAA >60 08/18/2015 1230    I No results found for: SPEP  Lab Results  Component Value Date   WBC 9.0 10/21/2015   NEUTROABS 6.0 10/21/2015   HGB 12.1 10/21/2015   HCT 37.0 10/21/2015   MCV 89.4 10/21/2015   PLT 264 10/21/2015      Chemistry      Component Value Date/Time   NA 134* 08/18/2015 1230   NA 139 08/08/2015 1219   K 3.6 08/18/2015 1230   K 3.6 08/08/2015 1219   CL 97* 08/18/2015 1230   CL 102 04/25/2013 1250   CO2 26 08/18/2015 1230   CO2 29 08/08/2015 1219   BUN 14 08/18/2015 1230   BUN 17.5 08/08/2015 1219   CREATININE 0.54 08/18/2015 1230   CREATININE 0.7 08/08/2015 1219      Component Value Date/Time   CALCIUM 9.9 08/18/2015 1230   CALCIUM 10.2 08/08/2015 1219   ALKPHOS 62 08/08/2015 1219   ALKPHOS 57 05/11/2013 1318   AST 28 08/08/2015 1219   AST 26 05/11/2013 1318   ALT 16 08/08/2015 1219   ALT 17 05/11/2013 1318   BILITOT <0.30 08/08/2015 1219   BILITOT 0.3 05/11/2013 1318       No results found for: LABCA2  No components found for: LABCA125  No results for input(s): INR in the last 168 hours.  Urinalysis    Component Value Date/Time   COLORURINE YELLOW 05/11/2013 1317   APPEARANCEUR CLEAR 05/11/2013 1317   LABSPEC 1.014 05/11/2013 1317   PHURINE 7.5 05/11/2013 1317   GLUCOSEU NEGATIVE 05/11/2013 1317   HGBUR NEGATIVE 05/11/2013 1317   BILIRUBINUR NEGATIVE 05/11/2013 1317   KETONESUR NEGATIVE 05/11/2013 1317   PROTEINUR NEGATIVE 05/11/2013 1317   UROBILINOGEN 1.0 05/11/2013 1317   NITRITE NEGATIVE 05/11/2013 1317   LEUKOCYTESUR NEGATIVE 05/11/2013 1317    STUDIES:  CLINICAL DATA: Patient  is being treated for right breast cancer. History of prior bilateral breast lumpectomies for breast cancer.  EXAM: DIGITAL DIAGNOSTIC BILATERAL MAMMOGRAM WITH 3D TOMOSYNTHESIS AND CAD  COMPARISON: Previous  exam(s).  ACR Breast Density Category b: There are scattered areas of fibroglandular density.  FINDINGS: Mammographically, there is a stable in appearance mass in the right breast upper outer quadrant, middle depth, the site of prior biopsy proven malignancy. Otherwise, there are stable postsurgical changes and posttreatment changes in both breasts.  Mammographic images were processed with CAD.  IMPRESSION: Stable appearance of the right breast with biopsy-proven malignancy.  No evidence of malignancy in the left breast, status post lumpectomy.  RECOMMENDATION: Continue with the plan of care for non malignancy.  I have discussed the findings and recommendations with the patient. Results were also provided in writing at the conclusion of the visit. If applicable, a reminder letter will be sent to the patient regarding the next appointment.  BI-RADS CATEGORY 6: Known biopsy-proven malignancy.   Electronically Signed  By: Fidela Salisbury M.D.  On: 09/15/2015 14:34     ASSESSMENT: 81 y.o. [place] woman  (1) status post remote lumpectomies to both breasts, with adjuvant radiation bilaterally, and prior history of tamoxifen and Arimidex  (2) Status post right breast biopsy 04/18/2013 for ductal carcinoma in situ, high-grade, with an area of microinvasion; the invasive cancer was estrogen receptor positive at 100%, progesterone receptor and HER-2 negative, with anMIB-1 of 2%.  (3)  status post right lumpectomy 05/21/2013 for a pT1b pnX, stage IA invasive ductal carcinoma, with negative margins(closest 1 mm from anterior margin)  (4) genetics testing discussed--patient and family prefer to forego genetic testing  (5) the patient had an 0.8 cm right lower lobe lung mass and a left posterior chest wall mass (likely hemangioma)   (a) repeat Chest CT 08/08/2015 shows the right lower lobe mass to be unchanged  (b) left posterior chest wall mass has decreased in  size c/w 03/01/2014 (6.2 cm to 3.4 cm)  (c) no further chest CT follow-up needed   (6) right breast biopsy 07/02/2015 shows a clinically T1c NX invasive lobular breast cancer,  Grade 2, strongly estrogen and progesterone receptor positive, with an MIB-1 of 5% and no HER-2 amplification   (7) anastrozole started September 2016 but interrupted initially because of peripheral neuropathy symptoms. These were felt to be unrelated and the patient's definitively resumed anastrozole October 2016  (a) osteoporosis, on alendronate  PLAN: Cyntha is tolerating the anastrozole generally well and obtaining it at a very good price. She is motivated to continue.  We reviewed her recent mammography. The right breast mass is stable. We can hope that with a little bit more treatment it will begin to shrink, but if it remains stable that would in itself be acceptable.  The plan will be for mammography on the right side only in 6 months and then bilateral mammograms again in 12 months.  At this point she has not decided whether she wants to pursue further workup as suggested by Dr. Jannifer Grant for the neuropathy question. She does have symptoms in the right hand consistent with carpal tunnel and I have suggested she wear a wrist splint on the left overnight. She is willing to give that a try.  Otherwise she will return to see me in February. She knows to call for any problems that may develop before that visit. Chauncey Cruel, MD   10/21/2015 12:24 PM

## 2015-10-22 ENCOUNTER — Other Ambulatory Visit: Payer: Self-pay | Admitting: Oncology

## 2015-10-22 DIAGNOSIS — N6310 Unspecified lump in the right breast, unspecified quadrant: Secondary | ICD-10-CM

## 2015-10-23 ENCOUNTER — Telehealth: Payer: Self-pay | Admitting: *Deleted

## 2015-10-23 NOTE — Telephone Encounter (Signed)
FYI Patient called asking "advice for dry hands.  I'm using anastrozole and now this slight problem has gotten worse.  I use Aquaphor.  Is there something OTC I can use?"  Denies flaking, itching, pain, redness, rash or broken skin.  This nurse advised Udder cream, Bag balm or Currel lotions.  No further questions or complaints at this time.

## 2015-11-05 DIAGNOSIS — L609 Nail disorder, unspecified: Secondary | ICD-10-CM | POA: Diagnosis not present

## 2015-11-05 DIAGNOSIS — R202 Paresthesia of skin: Secondary | ICD-10-CM | POA: Diagnosis not present

## 2015-11-05 DIAGNOSIS — I10 Essential (primary) hypertension: Secondary | ICD-10-CM | POA: Diagnosis not present

## 2015-11-05 DIAGNOSIS — E785 Hyperlipidemia, unspecified: Secondary | ICD-10-CM | POA: Diagnosis not present

## 2015-11-05 DIAGNOSIS — M542 Cervicalgia: Secondary | ICD-10-CM | POA: Diagnosis not present

## 2015-11-05 DIAGNOSIS — M810 Age-related osteoporosis without current pathological fracture: Secondary | ICD-10-CM | POA: Diagnosis not present

## 2015-11-06 ENCOUNTER — Other Ambulatory Visit: Payer: Self-pay | Admitting: Family Medicine

## 2015-11-06 DIAGNOSIS — M542 Cervicalgia: Secondary | ICD-10-CM

## 2015-11-14 ENCOUNTER — Ambulatory Visit
Admission: RE | Admit: 2015-11-14 | Discharge: 2015-11-14 | Disposition: A | Discharge status: Home / Self Care | Payer: Medicare Other | Source: Ambulatory Visit | Attending: Family Medicine | Admitting: Family Medicine

## 2015-11-14 ENCOUNTER — Telehealth: Payer: Self-pay | Admitting: Neurology

## 2015-11-14 ENCOUNTER — Other Ambulatory Visit: Payer: Self-pay | Admitting: Oncology

## 2015-11-14 DIAGNOSIS — M542 Cervicalgia: Secondary | ICD-10-CM

## 2015-11-14 DIAGNOSIS — C50411 Malignant neoplasm of upper-outer quadrant of right female breast: Secondary | ICD-10-CM

## 2015-11-14 DIAGNOSIS — M50221 Other cervical disc displacement at C4-C5 level: Secondary | ICD-10-CM | POA: Diagnosis not present

## 2015-11-14 NOTE — Telephone Encounter (Signed)
I called patient. MRI of the cervical spine shows evidence of metastatic disease in the vertebral bodies. The patient had been complaining of some neck pain, that is why she decided to have the study done. I will send this to her oncologist, question if radiation therapy may be of some benefit. I am not sure that this explains her paresthesias on her arms and legs.   MRI cervical 11/13/14:  IMPRESSION: Multifocal marrow signal abnormality consistent with metastatic breast cancer. Negative for fracture.  Mild spondylosis without notable central canal or foraminal stenosis.

## 2015-11-14 NOTE — Progress Notes (Unsigned)
Was called by Harlan Stains regarding Alejandra Grant's neck MRI, obtained because she was having pain in that area. It strongly suggests metastatic disease to C4, C6, and T1-T3. I have asked Dr. Lisbeth Renshaw to consider radiation. He will be contacting the patient has appropriate.  She was not supposed to see [place] again until June. I will get her an appointment with [place] in February.

## 2015-11-16 ENCOUNTER — Telehealth: Payer: Self-pay | Admitting: Oncology

## 2015-11-16 NOTE — Telephone Encounter (Signed)
Spoke with patient and she is aware of her follow up °

## 2015-11-18 ENCOUNTER — Encounter: Payer: Self-pay | Admitting: Radiation Oncology

## 2015-11-18 NOTE — Progress Notes (Addendum)
Histology and Location of Primary Cancer: Metasatic Breast Cancer to Bone(C4,C6,T1-T3 )  Sites of Visceral and Bony Metastatic Disease:   Location(s) of Symptomatic Metastases: Neck  Past/Anticipated chemotherapy by medical oncology, if any: Dr. Jana Hakim  ,  Tamoxifen 4.5 years ago and anastrozole ,anastrozole restarted September 2016 interrupted thought to have peripheral neuropathy but,  ,restarted in October 2016, Mammogram in 6 months right side, will follow up in February 2017, referral to Dr. Lisbeth Renshaw radiation  To C4,C6,T1,-T3)  Pain on a scale of 0-10 is:  no   If Spine Met(s), symptoms, if any, include:  Bowel/Bladder retention or incontinence spastic bladder,Chronic years, chronic constipation   Numbness or weakness in extremities : tingling hands/fet   Current Decadron regimen, if applicable: NO  Ambulatory status? Walker? Wheelchair?: cane  SAFETY ISSUES: Yes, blind right eye, minimal vision left eye   Prior radiation? Yes, B/L Breasts    Pacemaker/ICD? NO  Possible current pregnancy? N/A    Current Complaints / other details:  Dr. Lisbeth Renshaw saw patient  In breast clinic  6/25/ 2014 ,  Left Breast lumpectomy  1991, Right side ,  1994, Partial  Right mastectomy  05/21/13 with Dr. Fanny Skates, MD, Right breast bx 07/02/15,  GxP2, 1st live birth age 44, Widow, , Blind right eye, minimal vision left eye, HOH both ears   Allergies: ASA= bleeding/rash,Emete-Con(Benzquinamide)  BP 185/78 mmHg  Pulse 94  Temp(Src) 98.8 F (37.1 C) (Oral)  Resp 20  Ht 5' 1"  (1.549 m)  Wt 118 lb (53.524 kg)  BMI 22.31 kg/m2  SpO2 98%  Wt Readings from Last 3 Encounters:  11/20/15 118 lb (53.524 kg)  10/21/15 117 lb 4.8 oz (53.207 kg)  08/21/15 117 lb 8 oz (53.298 kg)

## 2015-11-20 ENCOUNTER — Ambulatory Visit
Admission: RE | Admit: 2015-11-20 | Discharge: 2015-11-20 | Disposition: A | Discharge status: Home / Self Care | Payer: Medicare Other | Source: Ambulatory Visit | Attending: Radiation Oncology | Admitting: Radiation Oncology

## 2015-11-20 ENCOUNTER — Encounter: Payer: Self-pay | Admitting: Radiation Oncology

## 2015-11-20 VITALS — BP 185/78 | HR 94 | Temp 98.8°F | Resp 20 | Ht 61.0 in | Wt 118.0 lb

## 2015-11-20 DIAGNOSIS — C7951 Secondary malignant neoplasm of bone: Secondary | ICD-10-CM | POA: Diagnosis not present

## 2015-11-20 DIAGNOSIS — C50911 Malignant neoplasm of unspecified site of right female breast: Secondary | ICD-10-CM | POA: Diagnosis present

## 2015-11-20 DIAGNOSIS — K5909 Other constipation: Secondary | ICD-10-CM | POA: Insufficient documentation

## 2015-11-20 DIAGNOSIS — R339 Retention of urine, unspecified: Secondary | ICD-10-CM | POA: Insufficient documentation

## 2015-11-20 DIAGNOSIS — R011 Cardiac murmur, unspecified: Secondary | ICD-10-CM | POA: Insufficient documentation

## 2015-11-20 DIAGNOSIS — D649 Anemia, unspecified: Secondary | ICD-10-CM | POA: Insufficient documentation

## 2015-11-20 DIAGNOSIS — H5411 Blindness, right eye, low vision left eye: Secondary | ICD-10-CM | POA: Insufficient documentation

## 2015-11-20 DIAGNOSIS — H548 Legal blindness, as defined in USA: Secondary | ICD-10-CM | POA: Diagnosis not present

## 2015-11-20 DIAGNOSIS — I10 Essential (primary) hypertension: Secondary | ICD-10-CM | POA: Diagnosis not present

## 2015-11-20 HISTORY — DX: Malignant neoplasm of bone and articular cartilage, unspecified: C41.9

## 2015-11-20 HISTORY — DX: Malignant neoplasm of unspecified site of unspecified female breast: C50.919

## 2015-11-20 NOTE — Progress Notes (Addendum)
Radiation Oncology         [phone] ________________________________  Name: Alejandra Grant MRN: [id number]  Date: 11/20/2015  DOB: 1923-09-27  XK:GYJEH,UDJSHFW S, MD  Fanny Skates, MD     REFERRING PHYSICIAN: Fanny Skates, MD   DIAGNOSIS: The primary encounter diagnosis was Osseous metastasis ([place]). Diagnoses of Bone neoplasm secondary ([place]) and Recurrent cancer of right breast [facility]) were also pertinent to this visit.   HISTORY OF PRESENT ILLNESS::Alejandra Grant is a 82 y.o. female who is seen for an initial consultation visit regarding the patient's diagnosis of metastatic breast cancer. The patient has a history of lumpectomies to both breasts with a history of adjuvant radiation treatment bilaterally. She also has a history of anti-hormonal treatment and is managed by Dr. Jana Hakim. The patient has been experiencing some neuropathy symptoms. An MRI scan was suggested which is difficult for the patient but she ultimately decided to proceed with this. This was completed on 11/14/2015 consisting of an MRI scan of the cervical spine. Diffuse metastatic was seen unfortunately on this scan with multifocal marrow signal abnormality seen consistent with metastatic breast cancer.  Significant disease was seen most prominently in the C4, C7, and the T1-T3 vertebral bodies. Vertebral height was maintained.  The patient states today that her neck pain has significantly improved recently. She does have some migratory areas of pain but no persistent severe areas.  Histology and Location of Primary Cancer: Metasatic Breast Cancer to Bone(C4,C6,T1-T3 )  Sites of Visceral and Bony Metastatic Disease:   Location(s) of Symptomatic Metastases: Neck  Past/Anticipated chemotherapy by medical oncology, if any: Dr. Jana Hakim  ,  Tamoxifen 4.5 years ago and anastrozole ,anastrozole restarted September 2016 interrupted thought to have peripheral neuropathy but,  ,restarted in October 2016, Mammogram in 6  months right side, will follow up in February 2017, referral to Dr. Lisbeth Renshaw radiation  To C4,C6,T1,-T3)  Pain on a scale of 0-10 is:  no   If Spine Met(s), symptoms, if any, include:  Bowel/Bladder retention or incontinence spastic bladder,Chronic years, chronic constipation   Numbness or weakness in extremities : tingling hands/fet   Current Decadron regimen, if applicable: NO  Ambulatory status? Walker? Wheelchair?: cane  SAFETY ISSUES: Yes, blind right eye, minimal vision left eye   Prior radiation? Yes, B/L Breasts    Pacemaker/ICD? NO  Possible current pregnancy? N/A    Current Complaints / other details:  Dr. Lisbeth Renshaw saw patient  In breast clinic  6/25/ 2014 ,  Left Breast lumpectomy  1991, Right side ,  1994, Partial  Right mastectomy  05/21/13 with Dr. Fanny Skates, MD, Right breast bx 07/02/15,  GxP2, 1st live birth age 81, Widow, , Blind right eye, minimal vision left eye, HOH both ears   Allergies: ASA= bleeding/rash,Emete-Con(Benzquinamide)  BP 185/78 mmHg  Pulse 94  Temp(Src) 98.8 F (37.1 C) (Oral)  Resp 20  Ht 5' 1"  (1.549 m)  Wt 118 lb (53.524 kg)  BMI 22.31 kg/m2  SpO2 98%  Wt Readings from Last 3 Encounters:  11/20/15 118 lb (53.524 kg)  10/21/15 117 lb 4.8 oz (53.207 kg)  08/21/15 117 lb 8 oz (53.298 kg)     PREVIOUS RADIATION THERAPY: No   PAST MEDICAL HISTORY:  has a past medical history of Hypertension; Urinary incontinence; Depression; Anxiety; Blindness, legal as defined in [place] Heart murmur; Cancer ([place]); Anemia; Paresthesia (08/21/2015); Breast cancer ([place]); and Bone cancer ([place]).     PAST SURGICAL HISTORY: Past Surgical History  Procedure Laterality Date  .  Breast lumpectomy Bilateral     left 1991,right side 1994  . Abdominal hysterectomy    . Back surgery    . Eye surgery Bilateral     cat  . Tonsillectomy    . Partial mastectomy with needle localization Right 05/21/2013    Procedure: PARTIAL MASTECTOMY WITH NEEDLE  LOCALIZATION;  Surgeon: Adin Hector, MD;  Location: [place];  Service: General;  Laterality: Right;     FAMILY HISTORY: family history is not on file.   SOCIAL HISTORY:  reports that she has never smoked. She has never used smokeless tobacco. She reports that she does not drink alcohol or use illicit drugs.   ALLERGIES: Aspirin and Emete-con   MEDICATIONS:  Current Outpatient Prescriptions  Medication Sig Dispense Refill  . alendronate (FOSAMAX) 70 MG tablet Take 70 mg by mouth every 7 (seven) days.     Marland Kitchen anastrozole (ARIMIDEX) 1 MG tablet Take 1 tablet (1 mg total) by mouth daily. 90 tablet 3  . CALCIUM PO Take 1 tablet by mouth daily.    . carvedilol (COREG) 3.125 MG tablet 1 tablet 2 (two) times daily.    Marland Kitchen FLUoxetine (PROZAC) 20 MG capsule Take 20 mg by mouth every other day.     Marland Kitchen FLUZONE HIGH-DOSE 0.5 ML SUSY Inject 1 Dose into the muscle once.  0  . hydrALAZINE (APRESOLINE) 25 MG tablet Take 50 mg by mouth 4 (four) times daily.     . mirtazapine (REMERON) 30 MG tablet Take 30 mg by mouth at bedtime.     . Multiple Vitamins-Minerals (ICAPS MV PO) Take by mouth 1 day or 1 dose.    Marland Kitchen OVER THE COUNTER MEDICATION 1 application. Fleet suppository for constipation    . potassium chloride (KLOR-CON) 8 MEQ tablet Take 16 mEq by mouth 2 (two) times daily.    . pravastatin (PRAVACHOL) 40 MG tablet Take 40 mg by mouth at bedtime.     . prednisoLONE acetate (PRED FORTE) 1 % ophthalmic suspension Place 1 drop into the right eye 2 (two) times daily.    Marland Kitchen rOPINIRole (REQUIP) 1 MG tablet Take 1 mg by mouth at bedtime.    . timolol (TIMOPTIC) 0.5 % ophthalmic solution Place 1 drop into both eyes 2 (two) times daily.    . traMADol (ULTRAM) 50 MG tablet Take 50-100 mg by mouth every 6 (six) hours as needed for moderate pain.     Marland Kitchen TRIBENZOR 40-10-25 MG TABS Take 1 tablet by mouth every morning.      No current facility-administered medications for this encounter.     REVIEW OF SYSTEMS:  A  15 point review of systems is documented in the electronic medical record. This was obtained by the nursing staff. However, I reviewed this with the patient to discuss relevant findings and make appropriate changes.  Pertinent items are noted in HPI.    PHYSICAL EXAM:  height is 5' 1"  (1.549 m) and weight is 118 lb (53.524 kg). Her oral temperature is 98.8 F (37.1 C). Her blood pressure is 185/78 and her pulse is 94. Her respiration is 20 and oxygen saturation is 98%.   ECOG = 1  0 - Asymptomatic (Fully active, able to carry on all predisease activities without restriction)  1 - Symptomatic but completely ambulatory (Restricted in physically strenuous activity but ambulatory and able to carry out work of a light or sedentary nature. For example, light housework, office work)  2 - Symptomatic, <50% in bed during the  day (Ambulatory and capable of all self care but unable to carry out any work activities. Up and about more than 50% of waking hours)  3 - Symptomatic, >50% in bed, but not bedbound (Capable of only limited self-care, confined to bed or chair 50% or more of waking hours)  4 - Bedbound (Completely disabled. Cannot carry on any self-care. Totally confined to bed or chair)  5 - Death   Eustace Pen MM, Creech RH, Tormey DC, et al. [phone]). "Toxicity and response criteria of the [facility]". [place] Oncol. 5 (6): 649-55     LABORATORY DATA:  Lab Results  Component Value Date   WBC 9.0 10/21/2015   HGB 12.1 10/21/2015   HCT 37.0 10/21/2015   MCV 89.4 10/21/2015   PLT 264 10/21/2015   Lab Results  Component Value Date   NA 138 10/21/2015   K 4.1 10/21/2015   CL 97* 08/18/2015   CO2 28 10/21/2015   Lab Results  Component Value Date   ALT 19 10/21/2015   AST 29 10/21/2015   ALKPHOS 87 10/21/2015   BILITOT 0.30 10/21/2015      RADIOGRAPHY: Mr Cervical Spine Wo Contrast  11/14/2015  CLINICAL DATA:  Intermittent bilateral neck and arm pain.  History of breast cancer. No known injury. Initial encounter. EXAM: MRI CERVICAL SPINE WITHOUT CONTRAST TECHNIQUE: Multiplanar, multisequence MR imaging of the cervical spine was performed. No intravenous contrast was administered. COMPARISON:  None. FINDINGS: There is multifocal marrow signal abnormality consistent with metastatic breast cancer. Largest lesions are in C4, C7 and the T1-T3 vertebral bodies with nearly completely replaced. Additional lesions are also seen including involvement of the right occipital condyle and left lateral mass of C1. There is no fracture. Vertebral body height is maintained. Trace anterolisthesis C4 on C5 due to facet arthropathy is noted. The craniocervical junction is normal and cervical cord signal is normal. Patchy T2 hyperintensity in the pons is consistent with chronic microvascular ischemic change. Imaged paraspinous structures are unremarkable. C2-3:  Negative. C3-4: Mild facet arthropathy and posterior bony ridging without central canal or foraminal stenosis. C4-5: Minimal disc bulge without central canal or foraminal narrowing. C5-6: Right worse than left facet arthropathy. Small disc osteophyte complex. The central canal and left foramen are open. Mild right foraminal narrowing noted. C6-7: There is a disc osteophyte complex and some uncovertebral disease. The central canal and foramina appear open. C7-T1:  Negative. IMPRESSION: Multifocal marrow signal abnormality consistent with metastatic breast cancer. Negative for fracture. Mild spondylosis without notable central canal or foraminal stenosis. Electronically Signed   By: Inge Rise M.D.   On: 11/14/2015 13:37       IMPRESSION:  We discussed different options in terms of management. The patient's pain for which she has been scheduled to see me today he states is much improved. We discussed that it does not appear necessary to begin radiation treatment immediately if she is hesitant to do so and she wishes to  pursue other options. We discussed possible means of obtaining additional imaging studies as well.  The patient does have an appointment scheduled with medical oncology in the near future and there was an interest in holding off on treatment given her level of symptoms currently and reviewing the status of her case further with Dr. Jana Hakim who knows the patient well.  I believe that this would be reasonable. The patient knows to contact our office or medical oncology if her neck pain because more severe or  she has significant new pain elsewhere.    PLAN: We will hold off on radiation treatment currently. I will see what results from her discussion with medical oncology within the near future. I would be happy to see the patient back at any point to further consider palliative radiation treatment, especially if her pain worsens.     The patient was seen today for 30 minutes, with the majority of the time spent counseling the patient on his diagnosis of cancer and coordinating his care.   ________________________________   Jodelle Gross, MD, PhD   **Disclaimer: This note was dictated with voice recognition software. Similar sounding words can inadvertently be transcribed and this note may contain transcription errors which may not have been corrected upon publication of note.**

## 2015-11-20 NOTE — Progress Notes (Signed)
Please see the Nurse Progress Note in the MD Initial Consult Encounter for this patient. 

## 2015-11-21 ENCOUNTER — Telehealth: Payer: Self-pay | Admitting: *Deleted

## 2015-11-21 ENCOUNTER — Other Ambulatory Visit: Payer: Self-pay | Admitting: Oncology

## 2015-11-21 NOTE — Telephone Encounter (Signed)
Son called to follow up with Dr Jana Hakim. Pt saw Dr Lisbeth Renshaw yesterday and wants to make sure Dr Jana Hakim has the information- not planning to do RT to her neck. Has follow up appt scheduled for 12/10/15. IF you need to call sonJennene Hemmerich [phone]

## 2015-11-25 ENCOUNTER — Telehealth: Payer: Self-pay

## 2015-11-25 ENCOUNTER — Other Ambulatory Visit: Payer: Self-pay | Admitting: Oncology

## 2015-11-25 DIAGNOSIS — H903 Sensorineural hearing loss, bilateral: Secondary | ICD-10-CM | POA: Diagnosis not present

## 2015-11-25 DIAGNOSIS — H6121 Impacted cerumen, right ear: Secondary | ICD-10-CM | POA: Diagnosis not present

## 2015-11-25 NOTE — Telephone Encounter (Signed)
Patient's son [name] called this afternoon questioning whether patient should have imaging for the pain she is experiencing in her neck.

## 2015-11-26 ENCOUNTER — Other Ambulatory Visit: Payer: Self-pay | Admitting: Oncology

## 2015-11-27 ENCOUNTER — Other Ambulatory Visit: Payer: Self-pay | Admitting: Oncology

## 2015-11-27 DIAGNOSIS — G893 Neoplasm related pain (acute) (chronic): Secondary | ICD-10-CM

## 2015-11-27 DIAGNOSIS — G629 Polyneuropathy, unspecified: Secondary | ICD-10-CM

## 2015-11-27 DIAGNOSIS — C7951 Secondary malignant neoplasm of bone: Secondary | ICD-10-CM

## 2015-11-27 DIAGNOSIS — C50411 Malignant neoplasm of upper-outer quadrant of right female breast: Secondary | ICD-10-CM

## 2015-11-27 NOTE — Progress Notes (Unsigned)
I spoke with Alejandra Grant's son Alejandra Grant today. To summarize the recent history: Alejandra Grant was having neck pain. An MRI of the neck was obtained and this did show apparent metastatic disease in that area. We sent her to Dr. Lisbeth Renshaw for likely radiation treatments but by the time she got fair the pain in the neck was better. On the other hand she has been having pain in many other parts of her body including the hips and lower back, sometimes shooting sometimes more like an ache. This information comes from her son who speaks with her daily.  At this point it is very unclear what is going on with Alejandra Grant. I think we need a better staging study and the CT scan we obtained in October, which was not that helpful. I'm setting her up for a PET scan. She already has a follow-up appointment with me. Once we identify the areas that need to be taking care of then perhaps we can do focused radiation to those areas with a view for palliation.  They know to call for any other problems that may develop before the next visit here.

## 2015-11-28 ENCOUNTER — Telehealth: Payer: Self-pay | Admitting: Oncology

## 2015-11-28 ENCOUNTER — Other Ambulatory Visit: Payer: Self-pay | Admitting: *Deleted

## 2015-11-28 ENCOUNTER — Telehealth: Payer: Self-pay | Admitting: *Deleted

## 2015-11-28 NOTE — Telephone Encounter (Signed)
This RN received call from [employer] stating MD order for PET could not be accommodated at [facility] until 12/10/2015.  Noted same day as MD appointment- there fore a conflict.  Per discussion with pt - she declines to go to another facility and is requesting [facility] only.  This RN informed CS that pt's appointment will be rescheduled appropriately to accommodate current date for PET.

## 2015-11-28 NOTE — Telephone Encounter (Signed)
Called patient and she is aware of her new appt 2/10

## 2015-12-01 ENCOUNTER — Other Ambulatory Visit: Payer: Self-pay | Admitting: Oncology

## 2015-12-04 ENCOUNTER — Encounter: Payer: Self-pay | Admitting: *Deleted

## 2015-12-04 NOTE — Progress Notes (Signed)
[place] Psychosocial Distress Screening Clinical Social Work  Clinical Social Work was referred by distress screening protocol.  The patient scored a 7 on the Psychosocial Distress Thermometer which indicates severe distress. Clinical Social Worker phoned pt to assess for distress and other psychosocial needs. CSW introduced self and explained role of CSW and Pt and Family Support Team. Pt reports she takes medication for her depression and it helps "a little bit". Pt denies every trying counseling. Pt may be open to this and agrees to consider. Pt hard of hearing, difficult to have phone conversation. Pt agrees to CSW checking in at appt on 12/12/15.   ONCBCN DISTRESS SCREENING 11/20/2015  Screening Type Initial Screening  Distress experienced in past week (1-10) 7  Emotional problem type Depression;Nervousness/Anxiety;Feeling hopeless  Information Concerns Type Lack of info about diagnosis;Lack of info about treatment;Lack of info about complementary therapy choices  Physical Problem type Pain;Sleep/insomnia;Tingling hands/feet;Swollen arms/legs  Physician notified of physical symptoms Yes  Referral to clinical social work Yes     Clinical Social Worker follow up needed: Yes.    If yes, follow up plan:  See above Loren Racer, [place] Worker [place]  [facility] Phone: [phone] Fax: [phone]

## 2015-12-10 ENCOUNTER — Ambulatory Visit: Payer: Medicare Other | Admitting: Oncology

## 2015-12-10 ENCOUNTER — Encounter (HOSPITAL_COMMUNITY)
Admission: RE | Admit: 2015-12-10 | Discharge: 2015-12-10 | Disposition: A | Discharge status: Home / Self Care | Payer: Medicare Other | Source: Ambulatory Visit | Attending: Oncology | Admitting: Oncology

## 2015-12-10 ENCOUNTER — Other Ambulatory Visit: Payer: Medicare Other

## 2015-12-10 DIAGNOSIS — C7951 Secondary malignant neoplasm of bone: Secondary | ICD-10-CM | POA: Diagnosis not present

## 2015-12-10 DIAGNOSIS — G629 Polyneuropathy, unspecified: Secondary | ICD-10-CM | POA: Insufficient documentation

## 2015-12-10 DIAGNOSIS — G893 Neoplasm related pain (acute) (chronic): Secondary | ICD-10-CM | POA: Insufficient documentation

## 2015-12-10 DIAGNOSIS — C50411 Malignant neoplasm of upper-outer quadrant of right female breast: Secondary | ICD-10-CM | POA: Diagnosis not present

## 2015-12-10 DIAGNOSIS — C50919 Malignant neoplasm of unspecified site of unspecified female breast: Secondary | ICD-10-CM | POA: Diagnosis not present

## 2015-12-10 LAB — GLUCOSE, CAPILLARY: GLUCOSE-CAPILLARY: 92 mg/dL (ref 65–99)

## 2015-12-10 MED ORDER — FLUDEOXYGLUCOSE F - 18 (FDG) INJECTION
6.0900 | Freq: Once | INTRAVENOUS | Status: AC | PRN
  Administered 2015-12-10: 6.09 via INTRAVENOUS

## 2015-12-12 ENCOUNTER — Other Ambulatory Visit: Payer: Self-pay | Admitting: Radiation Oncology

## 2015-12-12 ENCOUNTER — Ambulatory Visit (HOSPITAL_BASED_OUTPATIENT_CLINIC_OR_DEPARTMENT_OTHER): Payer: Medicare Other | Admitting: Oncology

## 2015-12-12 ENCOUNTER — Telehealth: Payer: Self-pay | Admitting: *Deleted

## 2015-12-12 ENCOUNTER — Other Ambulatory Visit (HOSPITAL_BASED_OUTPATIENT_CLINIC_OR_DEPARTMENT_OTHER): Payer: Medicare Other

## 2015-12-12 VITALS — BP 162/67 | HR 65 | Temp 97.9°F | Resp 18 | Ht 61.0 in | Wt 113.8 lb

## 2015-12-12 DIAGNOSIS — G622 Polyneuropathy due to other toxic agents: Secondary | ICD-10-CM | POA: Diagnosis not present

## 2015-12-12 DIAGNOSIS — R609 Edema, unspecified: Secondary | ICD-10-CM | POA: Diagnosis not present

## 2015-12-12 DIAGNOSIS — M2550 Pain in unspecified joint: Secondary | ICD-10-CM | POA: Diagnosis not present

## 2015-12-12 DIAGNOSIS — M810 Age-related osteoporosis without current pathological fracture: Secondary | ICD-10-CM

## 2015-12-12 DIAGNOSIS — M545 Low back pain: Secondary | ICD-10-CM

## 2015-12-12 DIAGNOSIS — C50411 Malignant neoplasm of upper-outer quadrant of right female breast: Secondary | ICD-10-CM

## 2015-12-12 DIAGNOSIS — C50911 Malignant neoplasm of unspecified site of right female breast: Secondary | ICD-10-CM | POA: Diagnosis not present

## 2015-12-12 DIAGNOSIS — C7951 Secondary malignant neoplasm of bone: Secondary | ICD-10-CM

## 2015-12-12 DIAGNOSIS — G893 Neoplasm related pain (acute) (chronic): Secondary | ICD-10-CM

## 2015-12-12 DIAGNOSIS — J984 Other disorders of lung: Secondary | ICD-10-CM

## 2015-12-12 DIAGNOSIS — G629 Polyneuropathy, unspecified: Secondary | ICD-10-CM

## 2015-12-12 DIAGNOSIS — I700 Atherosclerosis of aorta: Secondary | ICD-10-CM | POA: Insufficient documentation

## 2015-12-12 NOTE — Telephone Encounter (Signed)
Referral called to [facility] with  [place] per MD.

## 2015-12-12 NOTE — Progress Notes (Signed)
ID: Guadalupe Maple OB: 22-May-1977  MR#: [id number]  ZWC#:[id number]  PCP: Vidal Schwalbe, MD GYN:   SUFanny Skates OTHER MD: Kyung Rudd, Todd McDiarmid, Izora Gala, Rupinder Orene Desanctis  CHIEF COMPLAINT: Estrogen receptor positive breast cancer  CURRENT TREATMENT: anastrozole, denosumab  BREAST CANCER HISTORY: From the prior summary note:  Ms. Loomis has a prior history of bilateral lumpectomies, bilateral rest irradiation, and prior treatment with tamoxifen (approximately 4 and half years) and an anastrozole (unknown duration). The data is being obtained from the archives.  More recently some new calcifications were noted in the right breast, and digital right breast mammography with magnification views confirmed a cluster in the posterior third of the breast spanning an area of 1.3 cm. Repeat within 6 months was suggested, and performed 04/02/2013. The patient's breasts are heterogeneously dense. The area of 1.3 cm of microcalcifications had increased in number, and spanned 1.6 cm and this study. Accordingly biopsy was obtained 04/18/2013, and this showed ([id number]) high-grade ductal carcinoma in situ, with a 2 mm area of invasive disease. The invasive tumor was estrogen receptor 100% positive, progesterone receptor negative, with an MIB-1 of 2% and no HER-2 amplification.  The patient's subsequent history is as detailed below  INTERVAL HISTORY: Hadja returns today for follow up of her stage IV breast cancer accompanied by her sons [name] and [name]. Since her last visit here she was evaluated for neck pain and found to have bone lesions in the cervical spine. She was referred to radiation oncology who however felt the patient's pain was well controlled clinically and there was no evidence of cord impingement or radiculopathy. We then set [place] up for a PET scan which was performed 12/09/2015. This showed no evidence of involvement of the liver. There were several lung lesions  which were below the level of sensitivity of PET scan but in any case were not clearly hypermetabolic. She had innumerable mixed lytic and sclerotic lesions throughout all aspects of the axial and appendicular skeleton. There was diffuse infiltration at T6, a large 2 cm lytic lesion at T7, and a 1.6 cm lytic lesion in the left side of the T11 vertebral body. There was extensive hypermetabolic as an throughout the pelvis especially the left sacral ala. There was also significant atherosclerosis involving coronary arteries as well as the aortic valve and evidence of diverticular disease. The patient is here with her family to discuss those results   REVIEW OF SYSTEMS: Jelisha complains of fatigue. She denies headaches. Her vision is very poor. Her hearing is poor as well. She has ankle swelling, which is a concern of her. Her bladder is "overactive". She has back and joint pain, particularly involving the left leg, which is also "restless" and gets crampy. However there is not one area of worse pain then another. She just says she is very uncomfortable particularly in her mid and upper back and left hip. She has ankle swelling. She has balance problems but has not fallen. She is careful to use a cane or a walker at all times. She still has her peripheral neuropathy issues. She tried gabapentin and after one tablet was excessively sedated. She is taking hydrocodone for the pain, every 6 hours as needed. Some days she does not take it at all. It does not cause her itching or nausea. It does cause her some constipation. She is taking stool softeners for that. She manages to have a bowel movement every couple of days and so far there  are not excessively hard. A detailed review of systems today was otherwise stable  PAST MEDICAL HISTORY: Past Medical History  Diagnosis Date  . Hypertension   . Urinary incontinence   . Depression   . Anxiety   . 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 Heart  murmur   . Cancer (HCC)     breast, skin  . Anemia   . Paresthesia 08/21/2015  . Breast cancer ([facility])   . Bone cancer ([place])     PAST SURGICAL HISTORY: Past Surgical History  Procedure Laterality Date  . Breast lumpectomy Bilateral     left 1991,right side 1994  . Abdominal hysterectomy    . Back surgery    . Eye surgery Bilateral     cat  . Tonsillectomy    . Partial mastectomy with needle localization Right 05/21/2013    Procedure: PARTIAL MASTECTOMY WITH NEEDLE LOCALIZATION;  Surgeon: Adin Hector, MD;  Location: [facility];  Service: General;  Laterality: Right;    FAMILY HISTORY No family history on file. The patient's mother died giving birth to the patient, at age 69. The patient did not grow up with her father and has very little information regarding him or his side of the family. She believes he died in his 68s in a state hospital. The patient had no brothers or sisters.  GYNECOLOGIC HISTORY:  The patient does not recall how old she was at menarche. She thinks she went through menopause in her 21s. She is GX P2 with first live birth at age 82. The patient is status post hysterectomy but does not know if her ovaries were removed or not.  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 She uses the [employer] and other support services to get around, do her shopping, etc. Son [name] lives in [place] and works in Scientist, research (life sciences) estate. Son [name] lives in [place] and is in Economist. The patient has 3 grandchildren. She attends the first [employer] locally.    ADVANCED DIRECTIVES: In place. The patient's son [name] is her healthcare part turning. At the 12/12/2015 visit advanced directives were explicitly discussed with the patient, both children present. There was agreement that in case of a terminal event the patient should not be resuscitated, undergo CPR, or have live support.   HEALTH  MAINTENANCE: Social History  Substance Use Topics  . Smoking status: Never Smoker   . Smokeless tobacco: Never Used  . Alcohol Use: No     Colonoscopy:  PAP:  Bone density: October 2008, showing osteoporosis (left hip T score -2.6)  Lipid panel:  Allergies  Allergen Reactions  . Aspirin     Bleeding / rash  . Emete-Con [Benzquinamide]     Unknown nausea      Current Outpatient Prescriptions  Medication Sig Dispense Refill  . alendronate (FOSAMAX) 70 MG tablet Take 70 mg by mouth every 7 (seven) days.     Marland Kitchen anastrozole (ARIMIDEX) 1 MG tablet Take 1 tablet (1 mg total) by mouth daily. 90 tablet 3  . CALCIUM PO Take 1 tablet by mouth daily.    . carvedilol (COREG) 3.125 MG tablet 1 tablet 2 (two) times daily.    Marland Kitchen FLUoxetine (PROZAC) 20 MG capsule Take 20 mg by mouth every other day.     Marland Kitchen FLUZONE HIGH-DOSE 0.5 ML SUSY Inject 1 Dose into the muscle  once.  0  . hydrALAZINE (APRESOLINE) 25 MG tablet Take 50 mg by mouth 4 (four) times daily.     . mirtazapine (REMERON) 30 MG tablet Take 30 mg by mouth at bedtime.     . Multiple Vitamins-Minerals (ICAPS MV PO) Take by mouth 1 day or 1 dose.    Marland Kitchen OVER THE COUNTER MEDICATION 1 application. Fleet suppository for constipation    . potassium chloride (KLOR-CON) 8 MEQ tablet Take 16 mEq by mouth 2 (two) times daily.    . pravastatin (PRAVACHOL) 40 MG tablet Take 40 mg by mouth at bedtime.     . prednisoLONE acetate (PRED FORTE) 1 % ophthalmic suspension Place 1 drop into the right eye 2 (two) times daily.    Marland Kitchen rOPINIRole (REQUIP) 1 MG tablet Take 1 mg by mouth at bedtime.    . timolol (TIMOPTIC) 0.5 % ophthalmic solution Place 1 drop into both eyes 2 (two) times daily.    . traMADol (ULTRAM) 50 MG tablet Take 50-100 mg by mouth every 6 (six) hours as needed for moderate pain.     Marland Kitchen TRIBENZOR 40-10-25 MG TABS Take 1 tablet by mouth every morning.      No current facility-administered medications for this visit.    OBJECTIVE: Elderly  white woman in no acute distress Filed Vitals:   12/12/15 1112  BP: 162/67  Pulse: 65  Temp: 97.9 F (36.6 C)  Resp: 18     Body mass index is 21.51 kg/(m^2).    ECOG FS: 2  Sclerae unicteric, right keratopathy, chronic Oropharynx clear and moist No cervical or supraclavicular adenopathy Lungs no rales or rhonchi Heart regular rate and rhythm Abd soft, nontender, positive bowel sounds MSK no focal spinal tenderness to moderate palpation; grade 1 bilateral ankle edema  Neuro: nonfocal, dorsiflexion 5+ bilaterally, well oriented, appropriate affect Breasts: Deferred  LAB RESULTS:  CMP     Component Value Date/Time   NA 138 10/21/2015 1201   NA 134* 08/18/2015 1230   K 4.1 10/21/2015 1201   K 3.6 08/18/2015 1230   CL 97* 08/18/2015 1230   CL 102 04/25/2013 1250   CO2 28 10/21/2015 1201   CO2 26 08/18/2015 1230   GLUCOSE 110 10/21/2015 1201   GLUCOSE 103* 08/18/2015 1230   GLUCOSE 120* 04/25/2013 1250   BUN 19.1 10/21/2015 1201   BUN 14 08/18/2015 1230   CREATININE 0.8 10/21/2015 1201   CREATININE 0.54 08/18/2015 1230   CALCIUM 10.3 10/21/2015 1201   CALCIUM 9.9 08/18/2015 1230   PROT 6.9 10/21/2015 1201   PROT 6.9 05/11/2013 1318   ALBUMIN 3.7 10/21/2015 1201   ALBUMIN 3.9 05/11/2013 1318   AST 29 10/21/2015 1201   AST 26 05/11/2013 1318   ALT 19 10/21/2015 1201   ALT 17 05/11/2013 1318   ALKPHOS 87 10/21/2015 1201   ALKPHOS 57 05/11/2013 1318   BILITOT 0.30 10/21/2015 1201   BILITOT 0.3 05/11/2013 1318   GFRNONAA >60 08/18/2015 1230   GFRAA >60 08/18/2015 1230    I No results found for: SPEP  Lab Results  Component Value Date   WBC 9.0 10/21/2015   NEUTROABS 6.0 10/21/2015   HGB 12.1 10/21/2015   HCT 37.0 10/21/2015   MCV 89.4 10/21/2015   PLT 264 10/21/2015      Chemistry      Component Value Date/Time   NA 138 10/21/2015 1201   NA 134* 08/18/2015 1230   K 4.1 10/21/2015 1201   K 3.6 08/18/2015  1230   CL 97* 08/18/2015 1230   CL 102  04/25/2013 1250   CO2 28 10/21/2015 1201   CO2 26 08/18/2015 1230   BUN 19.1 10/21/2015 1201   BUN 14 08/18/2015 1230   CREATININE 0.8 10/21/2015 1201   CREATININE 0.54 08/18/2015 1230      Component Value Date/Time   CALCIUM 10.3 10/21/2015 1201   CALCIUM 9.9 08/18/2015 1230   ALKPHOS 87 10/21/2015 1201   ALKPHOS 57 05/11/2013 1318   AST 29 10/21/2015 1201   AST 26 05/11/2013 1318   ALT 19 10/21/2015 1201   ALT 17 05/11/2013 1318   BILITOT 0.30 10/21/2015 1201   BILITOT 0.3 05/11/2013 1318       No results found for: LABCA2  No components found for: LABCA125  No results for input(s): INR in the last 168 hours.  Urinalysis    Component Value Date/Time   COLORURINE YELLOW 05/11/2013 1317   APPEARANCEUR CLEAR 05/11/2013 1317   LABSPEC 1.014 05/11/2013 1317   PHURINE 7.5 05/11/2013 1317   GLUCOSEU NEGATIVE 05/11/2013 1317   HGBUR NEGATIVE 05/11/2013 1317   BILIRUBINUR NEGATIVE 05/11/2013 1317   KETONESUR NEGATIVE 05/11/2013 1317   PROTEINUR NEGATIVE 05/11/2013 1317   UROBILINOGEN 1.0 05/11/2013 1317   NITRITE NEGATIVE 05/11/2013 1317   LEUKOCYTESUR NEGATIVE 05/11/2013 1317    STUDIES: Mr Cervical Spine Wo Contrast  11/14/2015  CLINICAL DATA:  Intermittent bilateral neck and arm pain. History of breast cancer. No known injury. Initial encounter. EXAM: MRI CERVICAL SPINE WITHOUT CONTRAST TECHNIQUE: Multiplanar, multisequence MR imaging of the cervical spine was performed. No intravenous contrast was administered. COMPARISON:  None. FINDINGS: There is multifocal marrow signal abnormality consistent with metastatic breast cancer. Largest lesions are in C4, C7 and the T1-T3 vertebral bodies with nearly completely replaced. Additional lesions are also seen including involvement of the right occipital condyle and left lateral mass of C1. There is no fracture. Vertebral body height is maintained. Trace anterolisthesis C4 on C5 due to facet arthropathy is noted. The craniocervical  junction is normal and cervical cord signal is normal. Patchy T2 hyperintensity in the pons is consistent with chronic microvascular ischemic change. Imaged paraspinous structures are unremarkable. C2-3:  Negative. C3-4: Mild facet arthropathy and posterior bony ridging without central canal or foraminal stenosis. C4-5: Minimal disc bulge without central canal or foraminal narrowing. C5-6: Right worse than left facet arthropathy. Small disc osteophyte complex. The central canal and left foramen are open. Mild right foraminal narrowing noted. C6-7: There is a disc osteophyte complex and some uncovertebral disease. The central canal and foramina appear open. C7-T1:  Negative. IMPRESSION: Multifocal marrow signal abnormality consistent with metastatic breast cancer. Negative for fracture. Mild spondylosis without notable central canal or foraminal stenosis. Electronically Signed   By: Inge Rise M.D.   On: 11/14/2015 13:37   Nm Pet Image Initial (pi) Whole Body  12/10/2015  CLINICAL DATA:  Subsequent treatment strategy for breast cancer EXAM: NUCLEAR MEDICINE PET WHOLE BODY TECHNIQUE: 6.09 mCi F-18 FDG was injected intravenously. CT data was obtained and used for attenuation correction and anatomic localization only. (This was not acquired as a diagnostic CT examination.) Additional exam technical data entered on technologist worksheet. FASTING BLOOD GLUCOSE:  Value: 92 mg/dl COMPARISON:  Chest CT 08/08/2015. FINDINGS: Head/Neck: No hypermetabolic lymph nodes in the neck. Chest: Mild focal thickening of the left pectoralis minor muscle where there is mild hypermetabolism (SUVmax = 3.3). Postoperative changes of left-sided lumpectomy and left axillary lymph node dissection. No  other definite hypermetabolic soft tissue mass along the left chest wall or in the left axillary region. Enlarging right lower lobe pulmonary nodule (image 29 of series 8) which demonstrates no definite hypermetabolism (may be below the  level of PET resolution). New 5 mm left lower lobe pulmonary nodule (image 37 of series 8) also demonstrates no definite hypermetabolism. No acute consolidative airspace disease. No pleural effusions. Mild cardiomegaly. Atherosclerotic calcifications in the left anterior descending and right coronary arteries. Severe calcifications of the aortic valve. No hypermetabolic mediastinal or hilar lymphadenopathy. Status post right axillary nodal dissection. Abdomen/Pelvis: No abnormal hypermetabolic activity within the liver, pancreas, adrenal glands, or spleen. No hypermetabolic lymph nodes in the abdomen or pelvis. There is some hypermetabolism (SUVmax = 16.2)associated with the decompressed portion of the ascending colon (image 131 of series 4) which is nonspecific, and may simply be physiologic. No significant volume of ascites. No pneumoperitoneum. No pathologic distention of small bowel. Numerous colonic diverticulae are noted, most severe in the sigmoid colon. Extensive atherosclerosis throughout the abdominal and pelvic vasculature, without definite aneurysm. Skeleton: Innumerable mixed lytic and sclerotic lesions are noted throughout all aspects of the visualized axial and appendicular skeleton, compatible with widespread metastatic disease to the bones. Some of the larger lesions include a diffusely infiltrated T6 vertebral body which is hypermetabolic (SUVmax = 6.3)WGY has some associated surrounding soft tissue extension, most evident in the right paravertebral region (image 53 of series 4), a large 2 cm lytic lesion in T7 (SUVmax = 5.1), and a 1.6 cm lytic lesion in the left side of the T11 vertebral body (SUVmax = 6.0). There is also extensive hypermetabolism throughout the pelvis, most evident in the left sacral ala (SUVmax = 8.3 where there is some poorly defined osteolysis and sclerosis measuring approximately 4.3 x 3.2 cm (image 142 of series 4).) There is also some low-level hypermetabolism in the  musculature overlying the left posterior chest wall (SUVmax = 3.6)in the region of the left latissimus dorsi muscle. IMPRESSION: 1. Widespread metastatic disease throughout the bones, as above. 2. Soft tissue thickening and low-level hypermetabolism in the left pectoralis minor muscle. This may represent a muscular metastasis. There is also some soft tissue hypermetabolism in the lower left latissimus dorsi musculature. 3. Pulmonary nodules in the lower lobes of the lungs bilaterally measuring up to 8 mm in the right lower lobe, suspicious for potential metastatic disease (however, these lesions are below the level of PET resolution at this time). Attention on followup studies is recommended. 4. There is also some hypermetabolic activity associated with it decompressed portion of the ascending colon. This may simply be physiologic, however, correlation with colonoscopy could be considered if clinically appropriate. 5.  Atherosclerosis, including 2 vessel coronary artery disease. 6. There are severe calcifications of the aortic valve. Echocardiographic correlation for evaluation of potential valvular dysfunction may be warranted if clinically indicated. 7. Additional incidental findings, as above. Electronically Signed   By: Vinnie Langton M.D.   On: 12/10/2015 14:28     ASSESSMENT: 82 y.o. [place] woman  (1) status post remote lumpectomies to both breasts, with adjuvant radiation bilaterally, and prior history of tamoxifen and Arimidex  (2) Status post right breast biopsy 04/18/2013 for ductal carcinoma in situ, high-grade, with an area of microinvasion; the invasive cancer was estrogen receptor positive at 100%, progesterone receptor and HER-2 negative, with anMIB-1 of 2%.  (3)  status post right lumpectomy 05/21/2013 for a pT1b pnX, stage IA invasive ductal carcinoma, with negative margins(closest 1  mm from anterior margin)  (4) genetics testing discussed--patient and family prefer to forego  genetic testing  (5) the patient had an 0.8 cm right lower lobe lung mass and a left posterior chest wall mass (likely hemangioma)   (a) repeat Chest CT 08/08/2015 shows the right lower lobe mass to be unchanged  (b) left posterior chest wall mass has decreased in size c/w 03/01/2014 (6.2 cm to 3.4 cm)  (c) no further chest CT follow-up needed   (6) right breast biopsy 07/02/2015 shows a clinically T1c NX invasive lobular breast cancer,  Grade 2, strongly estrogen and progesterone receptor positive, with an MIB-1 of 5% and no HER-2 amplification   (7) anastrozole started September 2016 but interrupted initially because of peripheral neuropathy symptoms. These were felt to be unrelated and the patient's definitively resumed anastrozole October 2016  (a) osteoporosis, on alendronate--discontinued February 2017  METASTATIC DISEASE: (8) PET scan 12/09/2015 shows innumerable bone lesions. Lung lesions appear stable and are not hypermetabolic almond but are below the level of sensitivity of PET scan  (A) Denosumab/Xgeva started 12/17/2015  PLAN: I spent approximately one hour today with Heath Lark and HER-2 sons going over her situation. We reviewed her PET scan which does not show any involvement of the liver. There are some lung lesions, but they are not hypermetabolic. We can't be to certain that the lung lesions are not tumor, because the largest is 0.8 cm and PET scans loose sensitivity a low 1 cm. Nevertheless at present the lung lesions are not a major issue.  What is a major issue is her innumerable bone lesions. These are too many to irradiate. Certainly if she had one or 2 spots that were causing her the most pain and had severe pain in those spots we could do some local radiation for palliation.  What Karmen really describes is a feeling of discomfort that is very diffuse. It's really all over the place. This is moderately well-controlled with hydrocodone and so far she is tolerating the  hydrocodone well. It may make her a little bit groggy, but not itchy or nauseated and while it does constipate her she is managing that well with stool softeners and suppositories.  We discussed the fact that this is not a curable disease at this point. On the other hand many patients who have bone only metastatic disease can do well for a long period of time. I'm hopeful with anastrozole we will get some control of the cancer.  Also we are switching her from Fosamax to Denosumab, which I think will have a better chance of controlling the bony metastatic disease.  We discussed the possible toxicities, side effects and complications of Denosumab in detail.  Tia has other problems including restless legs, some ankle swelling, and peripheral neuropathy. These are not related to her cancer treatment or the cancer itself as far as I can tell. I don't think are going to be able to make much headway on those problems. I did suggest a consideration of compression stockings for the ankle swelling. She is already on a mild diuretic for her blood pressure issues.  They wanted to know a little bit more specific prognosis in terms of "time". Sadye has significant atherosclerotic disease which is also seen on the PET scan and she is 65 years old. I do not know what her prognosis on that basis will be. She may be alive Christmas of this year or she may not be. However in the absence of involvement of  her liver or lungs I am not sure that I can say she meets hospice criteria at this point.  For that reason we are putting in an in-house palliative care consult. I think this will be very helpful to her and to the family. We can always slight over into full hospice later on as appropriate.  We're going to start the Denosumab on Fabry 15th. She will see [place] again on March 15 and then on April 12. If everything is going well at that time we will consider repeating a CT scan of the chest before that may dose.  As noted  above, Mela should not be resuscitated in case of a terminal event. Chauncey Cruel, MD   12/12/2015 11:35 AM

## 2015-12-12 NOTE — Telephone Encounter (Signed)
CALLED PATIENT TO INFORM OF [place] VISIT PER DR. MOODY, NO ANSWER.

## 2015-12-13 LAB — CANCER ANTIGEN 27.29: CA 27.29: 225.9 U/mL — ABNORMAL HIGH (ref 0.0–38.6)

## 2015-12-15 ENCOUNTER — Other Ambulatory Visit: Payer: Self-pay | Admitting: Oncology

## 2015-12-15 ENCOUNTER — Telehealth: Payer: Self-pay

## 2015-12-15 NOTE — Telephone Encounter (Signed)
Appointments added per pof and i placed a note for patient to get a new schedule

## 2015-12-15 NOTE — Progress Notes (Signed)
Follow up New Consult Metastatic Bone secondary to Breast cancer [id number]) Saw Dr. Jana Hakim 12/03/15 :  Referral called to [facility] with [facility] per MD.  called by Hinda Lenis, RN 12/12/15    12/12/15:Dr. Jana Hakim note:  METASTATIC DISEASE: (8) PET scan 12/09/2015 shows innumerable bone lesions. Lung lesions appear stable and are not hypermetabolic almond but are below the level of sensitivity of PET scan (A) Denosumab/Xgeva started 12/17/2015 Also we are switching her from Fosamax to Denosumab, which I think will have a better chance of controlling the bony metastatic disease. We're going to start the Denosumab on February y 15th. She will see [place] again on March 15 and then on April 12. If everything is going well at that time we will consider repeating a CT scan of the chest before that may dose.  As noted above, Cristabella should not be resuscitated in case of a terminal event.  Palliative radiation referral

## 2015-12-17 ENCOUNTER — Ambulatory Visit
Admission: RE | Admit: 2015-12-17 | Discharge: 2015-12-17 | Disposition: A | Discharge status: Home / Self Care | Payer: Medicare Other | Source: Ambulatory Visit | Attending: Radiation Oncology | Admitting: Radiation Oncology

## 2015-12-17 ENCOUNTER — Ambulatory Visit: Payer: Medicare Other

## 2015-12-17 ENCOUNTER — Ambulatory Visit (HOSPITAL_BASED_OUTPATIENT_CLINIC_OR_DEPARTMENT_OTHER): Payer: Medicare Other

## 2015-12-17 VITALS — BP 125/51 | HR 74 | Temp 98.7°F

## 2015-12-17 DIAGNOSIS — C7951 Secondary malignant neoplasm of bone: Secondary | ICD-10-CM | POA: Diagnosis not present

## 2015-12-17 DIAGNOSIS — C50411 Malignant neoplasm of upper-outer quadrant of right female breast: Secondary | ICD-10-CM

## 2015-12-17 MED ORDER — DENOSUMAB 120 MG/1.7ML ~~LOC~~ SOLN
120.0000 mg | Freq: Once | SUBCUTANEOUS | Status: AC
  Administered 2015-12-17: 120 mg via SUBCUTANEOUS
  Filled 2015-12-17: qty 1.7

## 2015-12-17 NOTE — Patient Instructions (Signed)
Denosumab injection  What is this medicine?  DENOSUMAB (den oh sue mab) slows bone breakdown. Prolia is used to treat osteoporosis in women after menopause and in men. Xgeva is used to prevent bone fractures and other bone problems caused by cancer bone metastases. Xgeva is also used to treat giant cell tumor of the bone.  This medicine may be used for other purposes; ask your health care provider or pharmacist if you have questions.  What should I tell my health care provider before I take this medicine?  They need to know if you have any of these conditions:  -dental disease  -eczema  -infection or history of infections  -kidney disease or on dialysis  -low blood calcium or vitamin D  -malabsorption syndrome  -scheduled to have surgery or tooth extraction  -taking medicine that contains denosumab  -thyroid or parathyroid disease  -an unusual reaction to denosumab, other medicines, foods, dyes, or preservatives  -pregnant or trying to get pregnant  -breast-feeding  How should I use this medicine?  This medicine is for injection under the skin. It is given by a health care professional in a hospital or clinic setting.  If you are getting Prolia, a special MedGuide will be given to you by the pharmacist with each prescription and refill. Be sure to read this information carefully each time.  For Prolia, talk to your pediatrician regarding the use of this medicine in children. Special care may be needed. For Xgeva, talk to your pediatrician regarding the use of this medicine in children. While this drug may be prescribed for children as young as 13 years for selected conditions, precautions do apply.  Overdosage: If you think you have taken too much of this medicine contact a poison control center or emergency room at once.  NOTE: This medicine is only for you. Do not share this medicine with others.  What if I miss a dose?  It is important not to miss your dose. Call your doctor or health care professional if you are  unable to keep an appointment.  What may interact with this medicine?  Do not take this medicine with any of the following medications:  -other medicines containing denosumab  This medicine may also interact with the following medications:  -medicines that suppress the immune system  -medicines that treat cancer  -steroid medicines like prednisone or cortisone  This list may not describe all possible interactions. Give your health care provider a list of all the medicines, herbs, non-prescription drugs, or dietary supplements you use. Also tell them if you smoke, drink alcohol, or use illegal drugs. Some items may interact with your medicine.  What should I watch for while using this medicine?  Visit your doctor or health care professional for regular checks on your progress. Your doctor or health care professional may order blood tests and other tests to see how you are doing.  Call your doctor or health care professional if you get a cold or other infection while receiving this medicine. Do not treat yourself. This medicine may decrease your body's ability to fight infection.  You should make sure you get enough calcium and vitamin D while you are taking this medicine, unless your doctor tells you not to. Discuss the foods you eat and the vitamins you take with your health care professional.  See your dentist regularly. Brush and floss your teeth as directed. Before you have any dental work done, tell your dentist you are receiving this medicine.  Do   not become pregnant while taking this medicine or for 5 months after stopping it. Women should inform their doctor if they wish to become pregnant or think they might be pregnant. There is a potential for serious side effects to an unborn child. Talk to your health care professional or pharmacist for more information.  What side effects may I notice from receiving this medicine?  Side effects that you should report to your doctor or health care professional as soon as  possible:  -allergic reactions like skin rash, itching or hives, swelling of the face, lips, or tongue  -breathing problems  -chest pain  -fast, irregular heartbeat  -feeling faint or lightheaded, falls  -fever, chills, or any other sign of infection  -muscle spasms, tightening, or twitches  -numbness or tingling  -skin blisters or bumps, or is dry, peels, or red  -slow healing or unexplained pain in the mouth or jaw  -unusual bleeding or bruising  Side effects that usually do not require medical attention (Report these to your doctor or health care professional if they continue or are bothersome.):  -muscle pain  -stomach upset, gas  This list may not describe all possible side effects. Call your doctor for medical advice about side effects. You may report side effects to FDA at 1-800-FDA-1088.  Where should I keep my medicine?  This medicine is only given in a clinic, doctor's office, or other health care setting and will not be stored at home.  NOTE: This sheet is a summary. It may not cover all possible information. If you have questions about this medicine, talk to your doctor, pharmacist, or health care provider.      2016, Elsevier/Gold Standard. (2012-04-17 12:37:47)

## 2015-12-18 DIAGNOSIS — D1809 Hemangioma of other sites: Secondary | ICD-10-CM | POA: Diagnosis not present

## 2015-12-18 DIAGNOSIS — H353220 Exudative age-related macular degeneration, left eye, stage unspecified: Secondary | ICD-10-CM | POA: Diagnosis not present

## 2015-12-18 DIAGNOSIS — H31012 Macula scars of posterior pole (postinflammatory) (post-traumatic), left eye: Secondary | ICD-10-CM | POA: Diagnosis not present

## 2015-12-19 DIAGNOSIS — K5903 Drug induced constipation: Secondary | ICD-10-CM | POA: Diagnosis not present

## 2015-12-19 DIAGNOSIS — I10 Essential (primary) hypertension: Secondary | ICD-10-CM | POA: Diagnosis not present

## 2015-12-19 DIAGNOSIS — J301 Allergic rhinitis due to pollen: Secondary | ICD-10-CM | POA: Diagnosis not present

## 2015-12-22 DIAGNOSIS — R531 Weakness: Secondary | ICD-10-CM | POA: Diagnosis not present

## 2016-01-13 ENCOUNTER — Telehealth: Payer: Self-pay | Admitting: *Deleted

## 2016-01-13 NOTE — Telephone Encounter (Signed)
VM message received from patient @ 9:05 am regarding upcoming appt. TC to patient and reviewed/confirmed appts with her for tomorrow 01/14/16. No further issues identified.

## 2016-01-14 ENCOUNTER — Encounter: Payer: Self-pay | Admitting: Nurse Practitioner

## 2016-01-14 ENCOUNTER — Ambulatory Visit (HOSPITAL_BASED_OUTPATIENT_CLINIC_OR_DEPARTMENT_OTHER): Payer: Medicare Other | Admitting: Nurse Practitioner

## 2016-01-14 ENCOUNTER — Ambulatory Visit (HOSPITAL_BASED_OUTPATIENT_CLINIC_OR_DEPARTMENT_OTHER): Payer: Medicare Other

## 2016-01-14 ENCOUNTER — Other Ambulatory Visit (HOSPITAL_BASED_OUTPATIENT_CLINIC_OR_DEPARTMENT_OTHER): Payer: Medicare Other

## 2016-01-14 VITALS — BP 186/68 | HR 76 | Temp 97.8°F | Resp 17 | Ht 61.0 in | Wt 111.5 lb

## 2016-01-14 DIAGNOSIS — C7951 Secondary malignant neoplasm of bone: Secondary | ICD-10-CM | POA: Diagnosis not present

## 2016-01-14 DIAGNOSIS — G8929 Other chronic pain: Secondary | ICD-10-CM

## 2016-01-14 DIAGNOSIS — C50911 Malignant neoplasm of unspecified site of right female breast: Secondary | ICD-10-CM

## 2016-01-14 DIAGNOSIS — M810 Age-related osteoporosis without current pathological fracture: Secondary | ICD-10-CM | POA: Diagnosis not present

## 2016-01-14 DIAGNOSIS — J984 Other disorders of lung: Secondary | ICD-10-CM | POA: Diagnosis not present

## 2016-01-14 DIAGNOSIS — I700 Atherosclerosis of aorta: Secondary | ICD-10-CM

## 2016-01-14 DIAGNOSIS — C50411 Malignant neoplasm of upper-outer quadrant of right female breast: Secondary | ICD-10-CM | POA: Diagnosis not present

## 2016-01-14 DIAGNOSIS — G622 Polyneuropathy due to other toxic agents: Secondary | ICD-10-CM | POA: Diagnosis not present

## 2016-01-14 LAB — CBC WITH DIFFERENTIAL/PLATELET
BASO%: 0.3 % (ref 0.0–2.0)
Basophils Absolute: 0 10*3/uL (ref 0.0–0.1)
EOS%: 2.1 % (ref 0.0–7.0)
Eosinophils Absolute: 0.2 10*3/uL (ref 0.0–0.5)
HCT: 33.7 % — ABNORMAL LOW (ref 34.8–46.6)
HGB: 11.1 g/dL — ABNORMAL LOW (ref 11.6–15.9)
LYMPH%: 31.1 % (ref 14.0–49.7)
MCH: 27.8 pg (ref 25.1–34.0)
MCHC: 32.9 g/dL (ref 31.5–36.0)
MCV: 84.3 fL (ref 79.5–101.0)
MONO#: 1 10*3/uL — ABNORMAL HIGH (ref 0.1–0.9)
MONO%: 13.7 % (ref 0.0–14.0)
NEUT#: 4 10*3/uL (ref 1.5–6.5)
NEUT%: 52.8 % (ref 38.4–76.8)
Platelets: 291 10*3/uL (ref 145–400)
RBC: 4 10*6/uL (ref 3.70–5.45)
RDW: 17.7 % — ABNORMAL HIGH (ref 11.2–14.5)
WBC: 7.6 10*3/uL (ref 3.9–10.3)
lymph#: 2.4 10*3/uL (ref 0.9–3.3)

## 2016-01-14 LAB — COMPREHENSIVE METABOLIC PANEL
ALT: 18 U/L (ref 0–55)
AST: 30 U/L (ref 5–34)
Albumin: 3.5 g/dL (ref 3.5–5.0)
Alkaline Phosphatase: 103 U/L (ref 40–150)
Anion Gap: 7 mEq/L (ref 3–11)
BUN: 21.6 mg/dL (ref 7.0–26.0)
CHLORIDE: 101 meq/L (ref 98–109)
CO2: 29 meq/L (ref 22–29)
CREATININE: 0.9 mg/dL (ref 0.6–1.1)
Calcium: 10.1 mg/dL (ref 8.4–10.4)
EGFR: 57 mL/min/{1.73_m2} — ABNORMAL LOW (ref >90)
Glucose: 110 mg/dl (ref 70–140)
POTASSIUM: 4.1 meq/L (ref 3.5–5.1)
Sodium: 137 mEq/L (ref 136–145)
Total Bilirubin: <0.3 mg/dL (ref 0.20–1.20)
Total Protein: 7.5 g/dL (ref 6.4–8.3)

## 2016-01-14 MED ORDER — DENOSUMAB 120 MG/1.7ML ~~LOC~~ SOLN
120.0000 mg | Freq: Once | SUBCUTANEOUS | Status: AC
  Administered 2016-01-14: 120 mg via SUBCUTANEOUS
  Filled 2016-01-14: qty 1.7

## 2016-01-14 NOTE — Progress Notes (Signed)
ID: Alejandra Grant OB: 1923-04-01  MR#: [id number]  CWC#:[id number]  PCP: Vidal Schwalbe, MD GYN:   SUFanny Skates OTHER MD: Kyung Rudd, Todd McDiarmid, Izora Gala, Rupinder Orene Desanctis  CHIEF COMPLAINT: Estrogen receptor positive breast cancer  CURRENT TREATMENT: anastrozole, denosumab  BREAST CANCER HISTORY: From the prior summary note:  Alejandra Grant has a prior history of bilateral lumpectomies, bilateral rest irradiation, and prior treatment with tamoxifen (approximately 4 and half years) and an anastrozole (unknown duration). The data is being obtained from the archives.  More recently some new calcifications were noted in the right breast, and digital right breast mammography with magnification views confirmed a cluster in the posterior third of the breast spanning an area of 1.3 cm. Repeat within 6 months was suggested, and performed 04/02/2013. The patient's breasts are heterogeneously dense. The area of 1.3 cm of microcalcifications had increased in number, and spanned 1.6 cm and this study. Accordingly biopsy was obtained 04/18/2013, and this showed ([id number]) high-grade ductal carcinoma in situ, with a 2 mm area of invasive disease. The invasive tumor was estrogen receptor 100% positive, progesterone receptor negative, with an MIB-1 of 2% and no HER-2 amplification.  The patient's subsequent history is as detailed below  INTERVAL HISTORY: Alejandra Grant returns today for follow up of her stage IV breast cancer, accompanied by one of her sons. During her last visit, Dr. Jana Hakim discontinued her fosamax and started her on denosumab. She had her first injection on 2/15 and had no issues from this at all. The injection itself was not particularly painful. She did not develop any rashes or other symptoms to suggest a reaction. She has been stable in the interval history.   REVIEW OF SYSTEMS: Alejandra Grant denies fevers, chills, nausea, or vomiting. She is constipated from narcotic use  and take stool softeners and prune juice daily. She uses 1 hydrocodone pill maybe twice daily for the chronic pain to her upper mid back and right side. She has an overactive bladder. Her appetite is good. Her blood pressure is high, but she thinks this is due to anxiety surrounding today's visit. Her bilateral ankles are still swollen. She is ambulating with a cane today. She has peripheral neuropathy but this is stable. She is hard of hearing and her vision is poor. She has restless legs along with cramps. She denies shortness of breath, chest pain, cough, or palpitations.   PAST MEDICAL HISTORY: Past Medical History  Diagnosis Date  . Hypertension   . Urinary incontinence   . Depression   . Anxiety   . 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 Heart murmur   . Cancer (HCC)     breast, skin  . Anemia   . Paresthesia 08/21/2015  . Breast cancer ([place])   . Bone cancer ([place])     PAST SURGICAL HISTORY: Past Surgical History  Procedure Laterality Date  . Breast lumpectomy Bilateral     left 1991,right side 1994  . Abdominal hysterectomy    . Back surgery    . Eye surgery Bilateral     cat  . Tonsillectomy    . Partial mastectomy with needle localization Right 05/21/2013    Procedure: PARTIAL MASTECTOMY WITH NEEDLE LOCALIZATION;  Surgeon: Adin Hector, MD;  Location: [facility];  Service: General;  Laterality: Right;    FAMILY HISTORY No family history on file. The patient's mother died giving birth to the patient, at age 33. The patient did  not grow up with her father and has very little information regarding him or his side of the family. She believes he died in his 39s in a state hospital. The patient had no brothers or sisters.  GYNECOLOGIC HISTORY:  The patient does not recall how old she was at menarche. She thinks she went through menopause in her 7s. She is GX P2 with first live birth at age 37. The patient is status post hysterectomy but does not know if her  ovaries were removed or not.  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 She uses the [employer] and other support services to get around, do her shopping, etc. Son Alejandra Grant lives in [place] and works in Scientist, research (life sciences) estate. Son Alejandra Grant lives in [place] and is in Economist. The patient has 3 grandchildren. She attends the first [employer] locally.    ADVANCED DIRECTIVES: In place. The patient's son Alejandra Grant is her healthcare part turning. At the 12/12/2015 visit advanced directives were explicitly discussed with the patient, both children present. There was agreement that in case of a terminal event the patient should not be resuscitated, undergo CPR, or have live support.   HEALTH MAINTENANCE: Social History  Substance Use Topics  . Smoking status: Never Smoker   . Smokeless tobacco: Never Used  . Alcohol Use: No     Colonoscopy:  PAP:  Bone density: October 2008, showing osteoporosis (left hip T score -2.6)  Lipid panel:  Allergies  Allergen Reactions  . Aspirin     Bleeding / rash  . Emete-Con [Benzquinamide]     Unknown nausea      Current Outpatient Prescriptions  Medication Sig Dispense Refill  . anastrozole (ARIMIDEX) 1 MG tablet Take 1 tablet (1 mg total) by mouth daily. 90 tablet 3  . CALCIUM PO Take 1 tablet by mouth daily.    . carvedilol (COREG) 3.125 MG tablet 1 tablet 2 (two) times daily.    Marland Kitchen FLUoxetine (PROZAC) 20 MG capsule Take 20 mg by mouth every other day.     . hydrALAZINE (APRESOLINE) 25 MG tablet Take 50 mg by mouth 4 (four) times daily.     . mirtazapine (REMERON) 30 MG tablet Take 30 mg by mouth at bedtime.     . Multiple Vitamins-Minerals (ICAPS MV PO) Take by mouth 1 day or 1 dose.    . potassium chloride (KLOR-CON) 8 MEQ tablet Take 16 mEq by mouth 2 (two) times daily.    . prednisoLONE acetate (PRED FORTE) 1 % ophthalmic suspension Place 1 drop into the right  eye 2 (two) times daily.    Marland Kitchen rOPINIRole (REQUIP) 1 MG tablet Take 1 mg by mouth at bedtime.    . timolol (TIMOPTIC) 0.5 % ophthalmic solution Place 1 drop into both eyes 2 (two) times daily.    . traMADol (ULTRAM) 50 MG tablet Take 50-100 mg by mouth every 6 (six) hours as needed for moderate pain.     Marland Kitchen TRIBENZOR 40-10-25 MG TABS Take 1 tablet by mouth every morning.     Marland Kitchen OVER THE COUNTER MEDICATION 1 application. Reported on 01/14/2016     No current facility-administered medications for this visit.    OBJECTIVE: Elderly white woman in no acute distress Filed Vitals:   01/14/16 1506  BP: 186/68  Pulse: 76  Temp: 97.8 F (36.6 C)  Resp: 17     Body mass index is  21.08 kg/(m^2).    ECOG FS: 2  Sclerae unicteric, right keratopathy, chronic Oropharynx clear and moist No cervical or supraclavicular adenopathy Lungs no rales or rhonchi Heart regular rate and rhythm Abd soft, nontender, positive bowel sounds MSK no focal spinal tenderness to moderate palpation; grade 1 bilateral ankle edema  Neuro: nonfocal, dorsiflexion 5+ bilaterally, well oriented, appropriate affect Breasts: Deferred  LAB RESULTS:  CMP     Component Value Date/Time   NA 138 10/21/2015 1201   NA 134* 08/18/2015 1230   K 4.1 10/21/2015 1201   K 3.6 08/18/2015 1230   CL 97* 08/18/2015 1230   CL 102 04/25/2013 1250   CO2 28 10/21/2015 1201   CO2 26 08/18/2015 1230   GLUCOSE 110 10/21/2015 1201   GLUCOSE 103* 08/18/2015 1230   GLUCOSE 120* 04/25/2013 1250   BUN 19.1 10/21/2015 1201   BUN 14 08/18/2015 1230   CREATININE 0.8 10/21/2015 1201   CREATININE 0.54 08/18/2015 1230   CALCIUM 10.3 10/21/2015 1201   CALCIUM 9.9 08/18/2015 1230   PROT 6.9 10/21/2015 1201   PROT 6.9 05/11/2013 1318   ALBUMIN 3.7 10/21/2015 1201   ALBUMIN 3.9 05/11/2013 1318   AST 29 10/21/2015 1201   AST 26 05/11/2013 1318   ALT 19 10/21/2015 1201   ALT 17 05/11/2013 1318   ALKPHOS 87 10/21/2015 1201   ALKPHOS 57 05/11/2013  1318   BILITOT 0.30 10/21/2015 1201   BILITOT 0.3 05/11/2013 1318   GFRNONAA >60 08/18/2015 1230   GFRAA >60 08/18/2015 1230    I No results found for: SPEP  Lab Results  Component Value Date   WBC 7.6 01/14/2016   NEUTROABS 4.0 01/14/2016   HGB 11.1* 01/14/2016   HCT 33.7* 01/14/2016   MCV 84.3 01/14/2016   PLT 291 01/14/2016      Chemistry      Component Value Date/Time   NA 138 10/21/2015 1201   NA 134* 08/18/2015 1230   K 4.1 10/21/2015 1201   K 3.6 08/18/2015 1230   CL 97* 08/18/2015 1230   CL 102 04/25/2013 1250   CO2 28 10/21/2015 1201   CO2 26 08/18/2015 1230   BUN 19.1 10/21/2015 1201   BUN 14 08/18/2015 1230   CREATININE 0.8 10/21/2015 1201   CREATININE 0.54 08/18/2015 1230      Component Value Date/Time   CALCIUM 10.3 10/21/2015 1201   CALCIUM 9.9 08/18/2015 1230   ALKPHOS 87 10/21/2015 1201   ALKPHOS 57 05/11/2013 1318   AST 29 10/21/2015 1201   AST 26 05/11/2013 1318   ALT 19 10/21/2015 1201   ALT 17 05/11/2013 1318   BILITOT 0.30 10/21/2015 1201   BILITOT 0.3 05/11/2013 1318       No results found for: LABCA2  No components found for: LABCA125  No results for input(s): INR in the last 168 hours.  Urinalysis    Component Value Date/Time   COLORURINE YELLOW 05/11/2013 1317   APPEARANCEUR CLEAR 05/11/2013 1317   LABSPEC 1.014 05/11/2013 1317   PHURINE 7.5 05/11/2013 1317   GLUCOSEU NEGATIVE 05/11/2013 1317   HGBUR NEGATIVE 05/11/2013 1317   BILIRUBINUR NEGATIVE 05/11/2013 1317   KETONESUR NEGATIVE 05/11/2013 1317   PROTEINUR NEGATIVE 05/11/2013 1317   UROBILINOGEN 1.0 05/11/2013 1317   NITRITE NEGATIVE 05/11/2013 1317   LEUKOCYTESUR NEGATIVE 05/11/2013 1317    STUDIES: No results found.   ASSESSMENT: 82 y.o. [place] woman  (1) status post remote lumpectomies to both breasts, with adjuvant radiation bilaterally, and prior  history of tamoxifen and Arimidex  (2) Status post right breast biopsy 04/18/2013 for ductal carcinoma  in situ, high-grade, with an area of microinvasion; the invasive cancer was estrogen receptor positive at 100%, progesterone receptor and HER-2 negative, with anMIB-1 of 2%.  (3)  status post right lumpectomy 05/21/2013 for a pT1b pnX, stage IA invasive ductal carcinoma, with negative margins(closest 1 mm from anterior margin)  (4) genetics testing discussed--patient and family prefer to forego genetic testing  (5) the patient had an 0.8 cm right lower lobe lung mass and a left posterior chest wall mass (likely hemangioma)   (a) repeat Chest CT 08/08/2015 shows the right lower lobe mass to be unchanged  (b) left posterior chest wall mass has decreased in size c/w 03/01/2014 (6.2 cm to 3.4 cm)  (c) no further chest CT follow-up needed   (6) right breast biopsy 07/02/2015 shows a clinically T1c NX invasive lobular breast cancer,  Grade 2, strongly estrogen and progesterone receptor positive, with an MIB-1 of 5% and no HER-2 amplification   (7) anastrozole started September 2016 but interrupted initially because of peripheral neuropathy symptoms. These were felt to be unrelated and the patient's definitively resumed anastrozole October 2016  (a) osteoporosis, on alendronate--discontinued February 2017  METASTATIC DISEASE: (8) PET scan 12/09/2015 shows innumerable bone lesions. Lung lesions appear stable and are not hypermetabolic almond but are below the level of sensitivity of PET scan  (A) Denosumab/Xgeva started 12/17/2015  PLAN: Alejandra Grant is by all accounts stable today. She had no issues with her first denosumab injection at all. The labs were reviewed in detail and were stable. She will proceed with her next injection this afternoon.   Alejandra Grant is already scheduled for a follow up visit with her next injection in 4 weeks. After that injection Dr. Jana Hakim may consider repeating a chest CT per his last progress note. She understands and agrees with this plan. She knows the goal of  treatment in her case is control. She has been encouraged to call with any issues that might arise before her next visit here.   Laurie Panda, NP   01/14/2016 3:40 PM

## 2016-01-15 ENCOUNTER — Telehealth: Payer: Self-pay | Admitting: *Deleted

## 2016-01-15 LAB — CANCER ANTIGEN 27.29: CAN 27.29: 324.6 U/mL — AB (ref 0.0–38.6)

## 2016-01-15 NOTE — Telephone Encounter (Signed)
"  I was in yesterday and no one gave me my new appointments."   Reviewed and no new appointments. Previously scheduled for next lab, F/U and Xgeva injection 02-11-2016 beginning at 3:30 pm.  Appointment information provided at this time.

## 2016-01-20 ENCOUNTER — Telehealth: Payer: Self-pay

## 2016-01-20 ENCOUNTER — Telehealth: Payer: Self-pay | Admitting: *Deleted

## 2016-01-20 ENCOUNTER — Other Ambulatory Visit: Payer: Self-pay | Admitting: *Deleted

## 2016-01-20 MED ORDER — NAPROXEN 500 MG PO TABS: 500.0000 mg | ORAL_TABLET | Freq: Two times a day (BID) | ORAL | Status: DC

## 2016-01-20 NOTE — Telephone Encounter (Signed)
Pt states she is on hydrocodone 5-325, 1 tablet every 6 hours per Dr Harlan Stains. She has been taking them for a couple of months. The last 3-4 days has been worse pain. Last Wed she had her 2nd denosumab injection. Pain is in mid upper back and to the sides. Pain is 7-8/10, pain does not go away, and not much difference at all after taking hydrocodone in the last few days. She called Dr Dema Severin about increasing her dosage and Dr Dema Severin said to contact oncologist.

## 2016-01-20 NOTE — Telephone Encounter (Signed)
Called pt back to address her pain issues. Pt said she took [place] last Wed and starting 2 days later on Friday, she has had mid upper back pain since. She's has had little relief using the Hydrocodone. I asked pt would she be willing to try Naprosyn 500 mg twice/day per Towana Badger. She agreed. I will call Rx to her pharmacy. Message to be fwd to H.Boelter,NP.

## 2016-02-11 ENCOUNTER — Ambulatory Visit (HOSPITAL_BASED_OUTPATIENT_CLINIC_OR_DEPARTMENT_OTHER): Payer: Medicare Other | Admitting: Oncology

## 2016-02-11 ENCOUNTER — Telehealth: Payer: Self-pay | Admitting: Oncology

## 2016-02-11 ENCOUNTER — Ambulatory Visit (HOSPITAL_BASED_OUTPATIENT_CLINIC_OR_DEPARTMENT_OTHER): Payer: Medicare Other

## 2016-02-11 ENCOUNTER — Other Ambulatory Visit (HOSPITAL_BASED_OUTPATIENT_CLINIC_OR_DEPARTMENT_OTHER): Payer: Medicare Other

## 2016-02-11 VITALS — BP 163/70 | HR 69 | Temp 97.9°F | Resp 17 | Ht 61.0 in | Wt 109.9 lb

## 2016-02-11 DIAGNOSIS — I700 Atherosclerosis of aorta: Secondary | ICD-10-CM

## 2016-02-11 DIAGNOSIS — C50411 Malignant neoplasm of upper-outer quadrant of right female breast: Secondary | ICD-10-CM

## 2016-02-11 DIAGNOSIS — C7951 Secondary malignant neoplasm of bone: Secondary | ICD-10-CM

## 2016-02-11 DIAGNOSIS — C50911 Malignant neoplasm of unspecified site of right female breast: Secondary | ICD-10-CM | POA: Diagnosis not present

## 2016-02-11 DIAGNOSIS — K5900 Constipation, unspecified: Secondary | ICD-10-CM

## 2016-02-11 DIAGNOSIS — R351 Nocturia: Secondary | ICD-10-CM | POA: Diagnosis not present

## 2016-02-11 DIAGNOSIS — J984 Other disorders of lung: Secondary | ICD-10-CM | POA: Diagnosis not present

## 2016-02-11 DIAGNOSIS — M810 Age-related osteoporosis without current pathological fracture: Secondary | ICD-10-CM | POA: Diagnosis not present

## 2016-02-11 LAB — CBC WITH DIFFERENTIAL/PLATELET
BASO%: 0.6 % (ref 0.0–2.0)
BASOS ABS: 0.1 10*3/uL (ref 0.0–0.1)
EOS%: 2.9 % (ref 0.0–7.0)
Eosinophils Absolute: 0.2 10*3/uL (ref 0.0–0.5)
HCT: 35.7 % (ref 34.8–46.6)
HEMOGLOBIN: 11.5 g/dL — AB (ref 11.6–15.9)
LYMPH%: 32.4 % (ref 14.0–49.7)
MCH: 27.7 pg (ref 25.1–34.0)
MCHC: 32.4 g/dL (ref 31.5–36.0)
MCV: 85.5 fL (ref 79.5–101.0)
MONO#: 1 10*3/uL — ABNORMAL HIGH (ref 0.1–0.9)
MONO%: 12.4 % (ref 0.0–14.0)
NEUT#: 4.1 10*3/uL (ref 1.5–6.5)
NEUT%: 51.7 % (ref 38.4–76.8)
Platelets: 241 10*3/uL (ref 145–400)
RBC: 4.17 10*6/uL (ref 3.70–5.45)
RDW: 19.7 % — AB (ref 11.2–14.5)
WBC: 7.9 10*3/uL (ref 3.9–10.3)
lymph#: 2.6 10*3/uL (ref 0.9–3.3)

## 2016-02-11 LAB — COMPREHENSIVE METABOLIC PANEL
ALBUMIN: 3.6 g/dL (ref 3.5–5.0)
ALK PHOS: 77 U/L (ref 40–150)
ALT: 11 U/L (ref 0–55)
AST: 27 U/L (ref 5–34)
Anion Gap: 6 mEq/L (ref 3–11)
BUN: 24 mg/dL (ref 7.0–26.0)
CO2: 28 mEq/L (ref 22–29)
Calcium: 9.9 mg/dL (ref 8.4–10.4)
Chloride: 103 mEq/L (ref 98–109)
Creatinine: 0.9 mg/dL (ref 0.6–1.1)
EGFR: 54 mL/min/{1.73_m2} — ABNORMAL LOW (ref >90)
GLUCOSE: 133 mg/dL (ref 70–140)
POTASSIUM: 4.1 meq/L (ref 3.5–5.1)
SODIUM: 137 meq/L (ref 136–145)
TOTAL PROTEIN: 7.3 g/dL (ref 6.4–8.3)
Total Bilirubin: <0.3 mg/dL (ref 0.20–1.20)

## 2016-02-11 MED ORDER — DENOSUMAB 120 MG/1.7ML ~~LOC~~ SOLN
120.0000 mg | Freq: Once | SUBCUTANEOUS | Status: AC
  Administered 2016-02-11: 120 mg via SUBCUTANEOUS
  Filled 2016-02-11: qty 1.7

## 2016-02-11 NOTE — Progress Notes (Signed)
ID: Alejandra Grant OB: 02/08/1923  MR#: [id number]  QDI#:[id number]  PCP: Vidal Schwalbe, MD GYN:   SUFanny Skates OTHER MD: Kyung Rudd, Todd McDiarmid, Izora Gala, Rupinder Orene Desanctis  CHIEF COMPLAINT: Estrogen receptor positive breast cancer  CURRENT TREATMENT: anastrozole, denosumab  BREAST CANCER HISTORY: From the prior summary note:  Ms. Mask has a prior history of bilateral lumpectomies, bilateral rest irradiation, and prior treatment with tamoxifen (approximately 4 and half years) and an anastrozole (unknown duration). The data is being obtained from the archives.  More recently some new calcifications were noted in the right breast, and digital right breast mammography with magnification views confirmed a cluster in the posterior third of the breast spanning an area of 1.3 cm. Repeat within 6 months was suggested, and performed 04/02/2013. The patient's breasts are heterogeneously dense. The area of 1.3 cm of microcalcifications had increased in number, and spanned 1.6 cm and this study. Accordingly biopsy was obtained 04/18/2013, and this showed ([id number]) high-grade ductal carcinoma in situ, with a 2 mm area of invasive disease. The invasive tumor was estrogen receptor 100% positive, progesterone receptor negative, with an MIB-1 of 2% and no HER-2 amplification.  The patient's subsequent history is as detailed below  INTERVAL HISTORY: Alejandra Grant returns today for follow up of her metastatic breast cancer, accompanied by her son Alejandra Grant. She continues on anastrozole, with no side effects from that medication that she is aware of. She was started on denosumab in February. She had her second dose March 15. She says that with this dose she had significant pain lasting for about 3 days. She was prescribed naproxen for this but her pharmacy expressed some hesitation because of her eye problems and concerns regarding bleeding. Accordingly she never took the medication area she does  have tramadol but she did not take it and the pain went away on its own.  REVIEW OF SYSTEMS: Courtnie's functional status is very stable. She spends a good deal of the time listening to books on tape, usually Viagra fees or [place], Tax adviser stale's, and inspirational books. She takes approximately 1 tramadol daily for pain, usually around the middle of the day. Rarely she takes 2. This can constipate her, and she has been using a variety of stool softeners, mild laxatives, and prune juice. She has had no luck with MiraLAX which is [name] in for her. She is sleeping "okay" but has nocturia 4. Aside from these issues a detailed review of systems today was stable  PAST MEDICAL HISTORY: Past Medical History  Diagnosis Date  . Hypertension   . Urinary incontinence   . Depression   . Anxiety   . 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 Heart murmur   . Cancer (HCC)     breast, skin  . Anemia   . Paresthesia 08/21/2015  . Breast cancer ([place])   . Bone cancer ([place])     PAST SURGICAL HISTORY: Past Surgical History  Procedure Laterality Date  . Breast lumpectomy Bilateral     left 1991,right side 1994  . Abdominal hysterectomy    . Back surgery    . Eye surgery Bilateral     cat  . Tonsillectomy    . Partial mastectomy with needle localization Right 05/21/2013    Procedure: PARTIAL MASTECTOMY WITH NEEDLE LOCALIZATION;  Surgeon: Adin Hector, MD;  Location: [facility];  Service: General;  Laterality: Right;    FAMILY HISTORY No family history  on file. The patient's mother died giving birth to the patient, at age 80. The patient did not grow up with her father and has very little information regarding him or his side of the family. She believes he died in his 32s in a state hospital. The patient had no brothers or sisters.  GYNECOLOGIC HISTORY:  The patient does not recall how old she was at menarche. She thinks she went through menopause in her 80s. She is GX P2  with first live birth at age 82. The patient is status post hysterectomy but does not know if her ovaries were removed or not.  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 She uses the [employer] and other support services to get around, do her shopping, etc. Son Alejandra Grant lives in [place] and works in Scientist, research (life sciences) estate. Son Alejandra Grant lives in [place] and is in Economist. The patient has 3 grandchildren. She attends the first [employer] locally.    ADVANCED DIRECTIVES: In place. The patient's son Alejandra Grant is her healthcare part turning. At the 12/12/2015 visit advanced directives were explicitly discussed with the patient, both children present. There was agreement that in case of a terminal event the patient should not be resuscitated, undergo CPR, or have live support.   HEALTH MAINTENANCE: Social History  Substance Use Topics  . Smoking status: Never Smoker   . Smokeless tobacco: Never Used  . Alcohol Use: No     Colonoscopy:  PAP:  Bone density: October 2008, showing osteoporosis (left hip T score -2.6)  Lipid panel:  Allergies  Allergen Reactions  . Aspirin     Bleeding / rash  . Emete-Con [Benzquinamide]     Unknown nausea      Current Outpatient Prescriptions  Medication Sig Dispense Refill  . anastrozole (ARIMIDEX) 1 MG tablet Take 1 tablet (1 mg total) by mouth daily. 90 tablet 3  . CALCIUM PO Take 1 tablet by mouth daily.    . carvedilol (COREG) 3.125 MG tablet 1 tablet 2 (two) times daily.    Marland Kitchen FLUoxetine (PROZAC) 20 MG capsule Take 20 mg by mouth every other day.     . hydrALAZINE (APRESOLINE) 25 MG tablet Take 50 mg by mouth 4 (four) times daily.     . mirtazapine (REMERON) 30 MG tablet Take 30 mg by mouth at bedtime.     . Multiple Vitamins-Minerals (ICAPS MV PO) Take by mouth 1 day or 1 dose.    . naproxen (NAPROSYN) 500 MG tablet Take 1 tablet (500 mg total) by mouth 2 (two)  times daily with a meal. 30 tablet 0  . OVER THE COUNTER MEDICATION 1 application. Reported on 01/14/2016    . potassium chloride (KLOR-CON) 8 MEQ tablet Take 16 mEq by mouth 2 (two) times daily.    . prednisoLONE acetate (PRED FORTE) 1 % ophthalmic suspension Place 1 drop into the right eye 2 (two) times daily.    Marland Kitchen rOPINIRole (REQUIP) 1 MG tablet Take 1 mg by mouth at bedtime.    . timolol (TIMOPTIC) 0.5 % ophthalmic solution Place 1 drop into both eyes 2 (two) times daily.    . traMADol (ULTRAM) 50 MG tablet Take 50-100 mg by mouth every 6 (six) hours as needed for moderate pain.     Marland Kitchen TRIBENZOR 40-10-25 MG TABS Take 1 tablet by mouth every morning.      No current facility-administered medications for  this visit.    OBJECTIVE: Elderly white woman Who appears stated age 82 Vitals:   02/11/16 1549  BP: 163/70  Pulse: 69  Temp: 97.9 F (36.6 C)  Resp: 17     Body mass index is 20.78 kg/(m^2).    ECOG FS: 2  Sclerae unicteric, right keratopathyAs previously noted Oropharynx clear, no thrush or other lesions No cervical or supraclavicular adenopathy Lungs no rales or rhonchi Heart regular rate and rhythm Abd soft, nontender, positive bowel sounds MSK no focal spinal tenderness; no upper extremity lymphedema Neuro: nonfocal, well oriented, appropriate affect Breasts: Deferred  LAB RESULTS:  CMP     Component Value Date/Time   NA 137 01/14/2016 1448   NA 134* 08/18/2015 1230   K 4.1 01/14/2016 1448   K 3.6 08/18/2015 1230   CL 97* 08/18/2015 1230   CL 102 04/25/2013 1250   CO2 29 01/14/2016 1448   CO2 26 08/18/2015 1230   GLUCOSE 110 01/14/2016 1448   GLUCOSE 103* 08/18/2015 1230   GLUCOSE 120* 04/25/2013 1250   BUN 21.6 01/14/2016 1448   BUN 14 08/18/2015 1230   CREATININE 0.9 01/14/2016 1448   CREATININE 0.54 08/18/2015 1230   CALCIUM 10.1 01/14/2016 1448   CALCIUM 9.9 08/18/2015 1230   PROT 7.5 01/14/2016 1448   PROT 6.9 05/11/2013 1318   ALBUMIN 3.5  01/14/2016 1448   ALBUMIN 3.9 05/11/2013 1318   AST 30 01/14/2016 1448   AST 26 05/11/2013 1318   ALT 18 01/14/2016 1448   ALT 17 05/11/2013 1318   ALKPHOS 103 01/14/2016 1448   ALKPHOS 57 05/11/2013 1318   BILITOT <0.30 01/14/2016 1448   BILITOT 0.3 05/11/2013 1318   GFRNONAA >60 08/18/2015 1230   GFRAA >60 08/18/2015 1230    I No results found for: SPEP  Lab Results  Component Value Date   WBC 7.9 02/11/2016   NEUTROABS 4.1 02/11/2016   HGB 11.5* 02/11/2016   HCT 35.7 02/11/2016   MCV 85.5 02/11/2016   PLT 241 02/11/2016      Chemistry      Component Value Date/Time   NA 137 01/14/2016 1448   NA 134* 08/18/2015 1230   K 4.1 01/14/2016 1448   K 3.6 08/18/2015 1230   CL 97* 08/18/2015 1230   CL 102 04/25/2013 1250   CO2 29 01/14/2016 1448   CO2 26 08/18/2015 1230   BUN 21.6 01/14/2016 1448   BUN 14 08/18/2015 1230   CREATININE 0.9 01/14/2016 1448   CREATININE 0.54 08/18/2015 1230      Component Value Date/Time   CALCIUM 10.1 01/14/2016 1448   CALCIUM 9.9 08/18/2015 1230   ALKPHOS 103 01/14/2016 1448   ALKPHOS 57 05/11/2013 1318   AST 30 01/14/2016 1448   AST 26 05/11/2013 1318   ALT 18 01/14/2016 1448   ALT 17 05/11/2013 1318   BILITOT <0.30 01/14/2016 1448   BILITOT 0.3 05/11/2013 1318     Results for BRYNNLIE, UNTERREINER (MRN [id number]) as of 02/11/2016 18:25  Ref. Range 12/12/2015 10:54 01/14/2016 14:48  CA 27.29 Latest Ref Range: 0.0-38.6 U/mL 225.9 (H) 324.6 (H)    No results found for: LABCA2  No components found for: EBXID568  No results for input(s): INR in the last 168 hours.  Urinalysis    Component Value Date/Time   COLORURINE YELLOW 05/11/2013 1317   APPEARANCEUR CLEAR 05/11/2013 1317   LABSPEC 1.014 05/11/2013 1317   PHURINE 7.5 05/11/2013 1317   GLUCOSEU NEGATIVE 05/11/2013 1317  HGBUR NEGATIVE 05/11/2013 1317   BILIRUBINUR NEGATIVE 05/11/2013 1317   KETONESUR NEGATIVE 05/11/2013 1317   PROTEINUR NEGATIVE 05/11/2013 1317    UROBILINOGEN 1.0 05/11/2013 1317   NITRITE NEGATIVE 05/11/2013 1317   LEUKOCYTESUR NEGATIVE 05/11/2013 1317    STUDIES: No results found.   ASSESSMENT: 82 y.o. [place] woman  (1) status post remote lumpectomies to both breasts, with adjuvant radiation bilaterally, and prior history of tamoxifen and Arimidex  (2) Status post right breast biopsy 04/18/2013 for ductal carcinoma in situ, high-grade, with an area of microinvasion; the invasive cancer was estrogen receptor positive at 100%, progesterone receptor and HER-2 negative, with anMIB-1 of 2%.  (3)  status post right lumpectomy 05/21/2013 for a pT1b pnX, stage IA invasive ductal carcinoma, with negative margins(closest 1 mm from anterior margin)  (4) genetics testing discussed--patient and family prefer to forego genetic testing  (5) the patient had an 0.8 cm right lower lobe lung mass and a left posterior chest wall mass (likely hemangioma)   (a) repeat Chest CT 08/08/2015 shows the right lower lobe mass to be unchanged  (b) left posterior chest wall mass has decreased in size c/w 03/01/2014 (6.2 cm to 3.4 cm)  (c) no further chest CT follow-up needed   (6) right breast biopsy 07/02/2015 shows a clinically T1c NX invasive lobular breast cancer,  Grade 2, strongly estrogen and progesterone receptor positive, with an MIB-1 of 5% and no HER-2 amplification   (7) anastrozole started September 2016 but interrupted initially because of peripheral neuropathy symptoms. These were felt to be unrelated and the patient's definitively resumed anastrozole October 2016  (a) osteoporosis, on alendronate--discontinued February 2017  METASTATIC DISEASE: (8) PET scan 12/09/2015 shows innumerable bone lesions. Lung lesions appear stable and are not hypermetabolic almond but are below the level of sensitivity of PET scan  (A) Denosumab/Xgeva started 12/17/2015  PLAN: Probably had a little bit more trouble with her denosumab second cycle, or she  was having more bony pain from disease progression. The CA 2 is 739 has risen some, but sometimes it goes up before plateauing and falling. We went ahead and proceeded with the third dose of denosumab today.  We discussed the fact that bone lesions are very hard to follow because they may look worse on scans when actually being better secondary to healing. The pain from the bone lesions also can be due to inflammation which is part of the healing process in the bones.  I think the best thing to do would be to go ahead and proceed with the may denosumab dose and continue anastrozole, then obtain restaging studies, which would be a CT of the chest and a bone scan, mid May. She will see me shortly thereafter, and we will review those results, as well as the next CA-27-29 which will also be obtained mid-May. If the overall treatment suggests progression, we would switch to letrozole and palbociclib we would continue the denosumab in any case.  I suggested that she should add stool softeners her menu of drugs for constipation and she could take up to 2 twice a day.   Latreece has a good understanding of this plan. She knows to call for any problems that may develop before her next visit here.    Chauncey Cruel, MD   02/11/2016 4:01 PM

## 2016-02-11 NOTE — Telephone Encounter (Signed)
appt made and avs printed °

## 2016-03-01 DIAGNOSIS — H6121 Impacted cerumen, right ear: Secondary | ICD-10-CM | POA: Diagnosis not present

## 2016-03-10 ENCOUNTER — Other Ambulatory Visit (HOSPITAL_BASED_OUTPATIENT_CLINIC_OR_DEPARTMENT_OTHER): Payer: Medicare Other

## 2016-03-10 ENCOUNTER — Ambulatory Visit (HOSPITAL_BASED_OUTPATIENT_CLINIC_OR_DEPARTMENT_OTHER): Payer: Medicare Other

## 2016-03-10 VITALS — BP 182/70 | HR 67 | Temp 98.9°F

## 2016-03-10 DIAGNOSIS — C7951 Secondary malignant neoplasm of bone: Secondary | ICD-10-CM

## 2016-03-10 DIAGNOSIS — C50411 Malignant neoplasm of upper-outer quadrant of right female breast: Secondary | ICD-10-CM

## 2016-03-10 DIAGNOSIS — I700 Atherosclerosis of aorta: Secondary | ICD-10-CM

## 2016-03-10 DIAGNOSIS — C50911 Malignant neoplasm of unspecified site of right female breast: Secondary | ICD-10-CM

## 2016-03-10 LAB — CBC WITH DIFFERENTIAL/PLATELET
BASO%: 0.5 % (ref 0.0–2.0)
BASOS ABS: 0 10*3/uL (ref 0.0–0.1)
EOS%: 2.6 % (ref 0.0–7.0)
Eosinophils Absolute: 0.2 10*3/uL (ref 0.0–0.5)
HEMATOCRIT: 33.3 % — AB (ref 34.8–46.6)
HEMOGLOBIN: 11.1 g/dL — AB (ref 11.6–15.9)
LYMPH#: 2.4 10*3/uL (ref 0.9–3.3)
LYMPH%: 37.7 % (ref 14.0–49.7)
MCH: 28.5 pg (ref 25.1–34.0)
MCHC: 33.3 g/dL (ref 31.5–36.0)
MCV: 85.4 fL (ref 79.5–101.0)
MONO#: 0.9 10*3/uL (ref 0.1–0.9)
MONO%: 13.7 % (ref 0.0–14.0)
NEUT#: 3 10*3/uL (ref 1.5–6.5)
NEUT%: 45.5 % (ref 38.4–76.8)
PLATELETS: 220 10*3/uL (ref 145–400)
RBC: 3.9 10*6/uL (ref 3.70–5.45)
RDW: 19.6 % — AB (ref 11.2–14.5)
WBC: 6.5 10*3/uL (ref 3.9–10.3)

## 2016-03-10 LAB — COMPREHENSIVE METABOLIC PANEL
ALBUMIN: 3.7 g/dL (ref 3.5–5.0)
ALK PHOS: 66 U/L (ref 40–150)
ALT: 15 U/L (ref 0–55)
ANION GAP: 8 meq/L (ref 3–11)
AST: 28 U/L (ref 5–34)
BUN: 26.1 mg/dL — AB (ref 7.0–26.0)
CALCIUM: 10.3 mg/dL (ref 8.4–10.4)
CO2: 26 mEq/L (ref 22–29)
CREATININE: 0.8 mg/dL (ref 0.6–1.1)
Chloride: 105 mEq/L (ref 98–109)
EGFR: 61 mL/min/{1.73_m2} — ABNORMAL LOW (ref >90)
Glucose: 107 mg/dl (ref 70–140)
Potassium: 4.2 mEq/L (ref 3.5–5.1)
Sodium: 139 mEq/L (ref 136–145)
Total Protein: 7.1 g/dL (ref 6.4–8.3)

## 2016-03-10 MED ORDER — DENOSUMAB 120 MG/1.7ML ~~LOC~~ SOLN
120.0000 mg | Freq: Once | SUBCUTANEOUS | Status: AC
  Administered 2016-03-10: 120 mg via SUBCUTANEOUS
  Filled 2016-03-10: qty 1.7

## 2016-03-11 LAB — CANCER ANTIGEN 27.29: CAN 27.29: 310 U/mL — AB (ref 0.0–38.6)

## 2016-03-18 ENCOUNTER — Ambulatory Visit (HOSPITAL_COMMUNITY)
Admission: RE | Admit: 2016-03-18 | Discharge: 2016-03-18 | Disposition: A | Discharge status: Home / Self Care | Payer: Medicare Other | Source: Ambulatory Visit | Attending: Oncology | Admitting: Oncology

## 2016-03-18 ENCOUNTER — Encounter (HOSPITAL_COMMUNITY): Payer: Self-pay

## 2016-03-18 DIAGNOSIS — C50411 Malignant neoplasm of upper-outer quadrant of right female breast: Secondary | ICD-10-CM | POA: Diagnosis not present

## 2016-03-18 DIAGNOSIS — R911 Solitary pulmonary nodule: Secondary | ICD-10-CM | POA: Diagnosis not present

## 2016-03-18 DIAGNOSIS — M899 Disorder of bone, unspecified: Secondary | ICD-10-CM | POA: Diagnosis not present

## 2016-03-18 DIAGNOSIS — C50919 Malignant neoplasm of unspecified site of unspecified female breast: Secondary | ICD-10-CM | POA: Diagnosis not present

## 2016-03-18 DIAGNOSIS — C7951 Secondary malignant neoplasm of bone: Secondary | ICD-10-CM | POA: Insufficient documentation

## 2016-03-18 MED ORDER — TECHNETIUM TC 99M MEDRONATE IV KIT
25.0000 | PACK | Freq: Once | INTRAVENOUS | Status: AC | PRN
  Administered 2016-03-18: 27 via INTRAVENOUS

## 2016-03-18 MED ORDER — IOPAMIDOL (ISOVUE-300) INJECTION 61%
75.0000 mL | Freq: Once | INTRAVENOUS | Status: AC | PRN
  Administered 2016-03-18: 75 mL via INTRAVENOUS

## 2016-03-19 DIAGNOSIS — C50111 Malignant neoplasm of central portion of right female breast: Secondary | ICD-10-CM | POA: Diagnosis not present

## 2016-03-19 DIAGNOSIS — N898 Other specified noninflammatory disorders of vagina: Secondary | ICD-10-CM | POA: Diagnosis not present

## 2016-03-19 DIAGNOSIS — I10 Essential (primary) hypertension: Secondary | ICD-10-CM | POA: Diagnosis not present

## 2016-03-19 DIAGNOSIS — R1310 Dysphagia, unspecified: Secondary | ICD-10-CM | POA: Diagnosis not present

## 2016-03-23 ENCOUNTER — Telehealth: Payer: Self-pay | Admitting: Nurse Practitioner

## 2016-03-23 ENCOUNTER — Ambulatory Visit (HOSPITAL_BASED_OUTPATIENT_CLINIC_OR_DEPARTMENT_OTHER): Payer: Medicare Other | Admitting: Nurse Practitioner

## 2016-03-23 ENCOUNTER — Encounter: Payer: Self-pay | Admitting: Nurse Practitioner

## 2016-03-23 VITALS — BP 170/66 | HR 68 | Temp 99.1°F | Resp 18 | Ht 61.0 in | Wt 109.8 lb

## 2016-03-23 DIAGNOSIS — C50911 Malignant neoplasm of unspecified site of right female breast: Secondary | ICD-10-CM

## 2016-03-23 DIAGNOSIS — C7951 Secondary malignant neoplasm of bone: Secondary | ICD-10-CM

## 2016-03-23 DIAGNOSIS — C50411 Malignant neoplasm of upper-outer quadrant of right female breast: Secondary | ICD-10-CM

## 2016-03-23 DIAGNOSIS — M810 Age-related osteoporosis without current pathological fracture: Secondary | ICD-10-CM | POA: Diagnosis not present

## 2016-03-23 DIAGNOSIS — J984 Other disorders of lung: Secondary | ICD-10-CM

## 2016-03-23 DIAGNOSIS — M545 Low back pain: Secondary | ICD-10-CM

## 2016-03-23 NOTE — Progress Notes (Signed)
ID: Alejandra Grant OB: [birth year]  MR#: [id number]  XIH#:[id number]  PCP: Vidal Schwalbe, MD GYN:   SUFanny Skates OTHER MD: Kyung Rudd, Todd McDiarmid, Izora Gala, Rupinder Orene Desanctis  CHIEF COMPLAINT: Estrogen receptor positive breast cancer  CURRENT TREATMENT: anastrozole, denosumab  BREAST CANCER HISTORY: From the prior summary note:  Ms. Alejandra Grant has a prior history of bilateral lumpectomies, bilateral rest irradiation, and prior treatment with tamoxifen (approximately 4 and half years) and an anastrozole (unknown duration). The data is being obtained from the archives.  More recently some new calcifications were noted in the right breast, and digital right breast mammography with magnification views confirmed a cluster in the posterior third of the breast spanning an area of 1.3 cm. Repeat within 6 months was suggested, and performed 04/02/2013. The patient's breasts are heterogeneously dense. The area of 1.3 cm of microcalcifications had increased in number, and spanned 1.6 cm and this study. Accordingly biopsy was obtained 04/18/2013, and this showed ([id number]) high-grade ductal carcinoma in situ, with a 2 mm area of invasive disease. The invasive tumor was estrogen receptor 100% positive, progesterone receptor negative, with an MIB-1 of 2% and no HER-2 amplification.  The patient's subsequent history is as detailed below  INTERVAL HISTORY: Alejandra Grant returns today for follow up of her metastatic breast cancer, accompanied by a friend. She continues on anastrozole daily and also receives denosumab monthly. She tolerates both drugs reasonably well. She does have some moderate pain after her denosumab injections, but the pain resolves on its own. She is here to review the results of her restaging scans.   REVIEW OF SYSTEMS: Alejandra Grant denies fevers, chills, nausea, or vomiting. She manages her bowels with suppositories, prune juice, and sennakot. Miralax is too strong for her.  Her appetite is good, but she is down 8lb this year. She has tramadol available for pain to her back. Her blood pressure is high again today, but she has it taken at home and it is rarely elevated there. She has mild ankle swelling. She sleeps well, but is up several times at night to urinate. She is hard of hearing and her vision is poor. A detailed review of systems is otherwise stable.  PAST MEDICAL HISTORY: Past Medical History  Diagnosis Date  . Hypertension   . Urinary incontinence   . Depression   . Anxiety   . 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 Heart murmur   . Anemia   . Paresthesia 08/21/2015  . Cancer (HCC)     breast, skin  . Breast cancer ([place])   . Bone cancer ([place])     PAST SURGICAL HISTORY: Past Surgical History  Procedure Laterality Date  . Breast lumpectomy Bilateral     left 1991,right side 1994  . Abdominal hysterectomy    . Back surgery    . Eye surgery Bilateral     cat  . Tonsillectomy    . Partial mastectomy with needle localization Right 05/21/2013    Procedure: PARTIAL MASTECTOMY WITH NEEDLE LOCALIZATION;  Surgeon: Adin Hector, MD;  Location: [facility];  Service: General;  Laterality: Right;    FAMILY HISTORY No family history on file. The patient's mother died giving birth to the patient, at age 26. The patient did not grow up with her father and has very little information regarding him or his side of the family. She believes he died in his 25s in a state hospital. The patient had  no brothers or sisters.  GYNECOLOGIC HISTORY:  The patient does not recall how old she was at menarche. She thinks she went through menopause in her 75s. She is GX P2 with first live birth at age 68. The patient is status post hysterectomy but does not know if her ovaries were removed or not.  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 She uses the [employer] and other support services to get  around, do her shopping, etc. Son Alejandra Grant lives in [place] and works in Scientist, research (life sciences) estate. Son Alejandra Grant lives in [place] and is in Economist. The patient has 3 grandchildren. She attends the first [employer] locally.    ADVANCED DIRECTIVES: In place. The patient's son Alejandra Grant is her healthcare part turning. At the 12/12/2015 visit advanced directives were explicitly discussed with the patient, both children present. There was agreement that in case of a terminal event the patient should not be resuscitated, undergo CPR, or have live support.   HEALTH MAINTENANCE: Social History  Substance Use Topics  . Smoking status: Never Smoker   . Smokeless tobacco: Never Used  . Alcohol Use: No     Colonoscopy:  PAP:  Bone density: October 2008, showing osteoporosis (left hip T score -2.6)  Lipid panel:  Allergies  Allergen Reactions  . Aspirin     Bleeding / rash    Current Outpatient Prescriptions  Medication Sig Dispense Refill  . anastrozole (ARIMIDEX) 1 MG tablet Take 1 tablet (1 mg total) by mouth daily. 90 tablet 3  . CALCIUM PO Take 1 tablet by mouth daily.    . carvedilol (COREG) 3.125 MG tablet 1 tablet 2 (two) times daily.    Marland Kitchen FLUoxetine (PROZAC) 20 MG capsule Take 20 mg by mouth every other day.     . hydrALAZINE (APRESOLINE) 25 MG tablet Take 50 mg by mouth 4 (four) times daily.     . mirtazapine (REMERON) 30 MG tablet Take 30 mg by mouth at bedtime.     . Multiple Vitamins-Minerals (ICAPS MV PO) Take by mouth 1 day or 1 dose.    . potassium chloride (KLOR-CON) 8 MEQ tablet Take 16 mEq by mouth 2 (two) times daily.    . prednisoLONE acetate (PRED FORTE) 1 % ophthalmic suspension Place 1 drop into the right eye 2 (two) times daily.    Marland Kitchen rOPINIRole (REQUIP) 1 MG tablet Take 1 mg by mouth at bedtime.    . timolol (TIMOPTIC) 0.5 % ophthalmic solution Place 1 drop into both eyes 2 (two) times daily.    . traMADol (ULTRAM) 50 MG tablet Take 50-100 mg by  mouth every 6 (six) hours as needed for moderate pain.     Marland Kitchen TRIBENZOR 40-10-25 MG TABS Take 1 tablet by mouth every morning.      No current facility-administered medications for this visit.    OBJECTIVE: Elderly white woman Who appears stated age 82 Vitals:   03/23/16 1320  BP: 170/66  Pulse: 68  Temp: 99.1 F (37.3 C)  Resp: 18     Body mass index is 20.76 kg/(m^2).    ECOG FS: 2  Skin: warm, dry  HEENT: Sclerae unicteric, right keratopathy, conjunctivae pink, oropharynx clear. No thrush or mucositis.  Lymph Nodes: No cervical or supraclavicular lymphadenopathy  Lungs: clear to auscultation bilaterally, no rales, wheezes, or rhonci  Heart: regular rate and rhythm  Abdomen: round, soft, non tender, positive  bowel sounds  Musculoskeletal: No focal spinal tenderness, bilateral +1 edema Neuro: non focal, well oriented, positive affect  Breasts: deferred  LAB RESULTS:  CMP     Component Value Date/Time   NA 139 03/10/2016 1530   NA 134* 08/18/2015 1230   K 4.2 03/10/2016 1530   K 3.6 08/18/2015 1230   CL 97* 08/18/2015 1230   CL 102 04/25/2013 1250   CO2 26 03/10/2016 1530   CO2 26 08/18/2015 1230   GLUCOSE 107 03/10/2016 1530   GLUCOSE 103* 08/18/2015 1230   GLUCOSE 120* 04/25/2013 1250   BUN 26.1* 03/10/2016 1530   BUN 14 08/18/2015 1230   CREATININE 0.8 03/10/2016 1530   CREATININE 0.54 08/18/2015 1230   CALCIUM 10.3 03/10/2016 1530   CALCIUM 9.9 08/18/2015 1230   PROT 7.1 03/10/2016 1530   PROT 6.9 05/11/2013 1318   ALBUMIN 3.7 03/10/2016 1530   ALBUMIN 3.9 05/11/2013 1318   AST 28 03/10/2016 1530   AST 26 05/11/2013 1318   ALT 15 03/10/2016 1530   ALT 17 05/11/2013 1318   ALKPHOS 66 03/10/2016 1530   ALKPHOS 57 05/11/2013 1318   BILITOT <0.30 03/10/2016 1530   BILITOT 0.3 05/11/2013 1318   GFRNONAA >60 08/18/2015 1230   GFRAA >60 08/18/2015 1230    I No results found for: SPEP  Lab Results  Component Value Date   WBC 6.5 03/10/2016    NEUTROABS 3.0 03/10/2016   HGB 11.1* 03/10/2016   HCT 33.3* 03/10/2016   MCV 85.4 03/10/2016   PLT 220 03/10/2016      Chemistry      Component Value Date/Time   NA 139 03/10/2016 1530   NA 134* 08/18/2015 1230   K 4.2 03/10/2016 1530   K 3.6 08/18/2015 1230   CL 97* 08/18/2015 1230   CL 102 04/25/2013 1250   CO2 26 03/10/2016 1530   CO2 26 08/18/2015 1230   BUN 26.1* 03/10/2016 1530   BUN 14 08/18/2015 1230   CREATININE 0.8 03/10/2016 1530   CREATININE 0.54 08/18/2015 1230      Component Value Date/Time   CALCIUM 10.3 03/10/2016 1530   CALCIUM 9.9 08/18/2015 1230   ALKPHOS 66 03/10/2016 1530   ALKPHOS 57 05/11/2013 1318   AST 28 03/10/2016 1530   AST 26 05/11/2013 1318   ALT 15 03/10/2016 1530   ALT 17 05/11/2013 1318   BILITOT <0.30 03/10/2016 1530   BILITOT 0.3 05/11/2013 1318      Results for RAYLINN, KOSAR (MRN [id number]) as of 03/23/2016 16:01  Ref. Range 12/12/2015 10:54 01/14/2016 14:48 03/10/2016 15:30  CA 27.29 Latest Ref Range: 0.0-38.6 U/mL 225.9 (H) 324.6 (H) 310.0 (H)    No results found for: LABCA2  No components found for: [id number]  No results for input(s): INR in the last 168 hours.  Urinalysis    Component Value Date/Time   COLORURINE YELLOW 05/11/2013 1317   APPEARANCEUR CLEAR 05/11/2013 1317   LABSPEC 1.014 05/11/2013 1317   PHURINE 7.5 05/11/2013 1317   GLUCOSEU NEGATIVE 05/11/2013 1317   HGBUR NEGATIVE 05/11/2013 1317   BILIRUBINUR NEGATIVE 05/11/2013 1317   KETONESUR NEGATIVE 05/11/2013 1317   PROTEINUR NEGATIVE 05/11/2013 1317   UROBILINOGEN 1.0 05/11/2013 1317   NITRITE NEGATIVE 05/11/2013 1317   LEUKOCYTESUR NEGATIVE 05/11/2013 1317    STUDIES: Ct Chest W Contrast  03/18/2016  CLINICAL DATA:  Followup metastatic right breast carcinoma. Undergoing oral chemotherapy. EXAM: CT CHEST WITH CONTRAST TECHNIQUE: Multidetector CT imaging of the chest was performed  during intravenous contrast administration. CONTRAST:  30m ISOVUE-300  IOPAMIDOL (ISOVUE-300) INJECTION 61% COMPARISON:  PET-CT on 12/10/2015 and chest CT on 08/08/2015 and 05/07/2013 FINDINGS: Mediastinum/Lymph Nodes: No pathologically enlarged lymph nodes identified. Tiny sub-cm thyroid nodules remains stable. Cardiomegaly is stable. Aortic atherosclerotic calcification noted. Lungs/Pleura: 8 mm pulmonary nodule in the right lower lobe on image 61/ series 5 remains stable compared to previous studies dating back to 2014, consistent with benign etiology. Previously noted small left lower lobe pulmonary nodule is no longer visualized on today's study and is felt to have been vascular in etiology. No other suspicious pulmonary nodules or masses are identified. No evidence of pulmonary consolidation or pleural effusion. Radiation changes in anterior right upper and middle lobes remain stable. Upper abdomen: No acute findings. Normal appearance adrenal glands. Stable tiny left hepatic lobe cyst. Musculoskeletal: Asymmetric thickening of left pectoralis minor muscle remains stable. Fluid attenuation lesion is seen in the subscapular region of the left posterior chest wall which appears well-circumscribed and measures 1.9 x 6.5 cm. This most likely due to subcapsular pseudo bursitis, with chest wall metastasis considered much less likely. Diffuse mixed lytic and sclerotic bone metastases throughout the spine appears stable. IMPRESSION: Stable 8 mm right lower lobe pulmonary nodule dating back to 2014, consistent with benign etiology. Stable right lung radiation changes. No evidence of lymphadenopathy or pleural effusion. Stable asymmetric thickening of left pectoralis minor muscle. New well-circumscribed fluid attenuation lesion in subcapsular region of left posterior chest wall, likely representing pseudo bursitis, with chest wall metastasis considered much less likely. Continued attention recommended on follow-up imaging. Stable appearance of diffuse bone metastases. Electronically Signed    By: Alejandra GellM.D.   On: 03/18/2016 12:19   Nm Bone Scan Whole Body  03/18/2016  CLINICAL DATA:  Breast cancer. Bone metastasis. Back pain and shoulder pain EXAM: NUCLEAR MEDICINE WHOLE BODY BONE SCAN TECHNIQUE: Whole body anterior and posterior images were obtained approximately 3 hours after intravenous injection of radiopharmaceutical. RADIOPHARMACEUTICALS:  Twenty-seven mCi Technetium-948mDP IV COMPARISON:  PET-CT 12/10/2015 FINDINGS: There are multiple foci of radiotracer accumulation within the thoracic spine consistent skeletal metastasis. There are several lesions within the posterior RIGHT ribs consistent metastatic lesions. Localization in the LEFT sacrum also consistent metastatic disease. This pattern is reflected in the hypermetabolic skeletal metastasis seen on comparison FDG PET scan. No new sites of skeletal metastasis. IMPRESSION: 1. Skeletal metastasis again demonstrated within the spine, ribs, and pelvis. 2. No changed from FDG PET-CT scan 12/10/2015. Electronically Signed   By: Alejandra Grant.D.   On: 03/18/2016 13:52    ASSESSMENT: [age].o. [place] woman  (1) status post remote lumpectomies to both breasts, with adjuvant radiation bilaterally, and prior history of tamoxifen and Arimidex  (2) Status post right breast biopsy 04/18/2013 for ductal carcinoma in situ, high-grade, with an area of microinvasion; the invasive cancer was estrogen receptor positive at 100%, progesterone receptor and HER-2 negative, with anMIB-1 of 2%.  (3)  status post right lumpectomy 05/21/2013 for a pT1b pnX, stage IA invasive ductal carcinoma, with negative margins(closest 1 mm from anterior margin)  (4) genetics testing discussed--patient and family prefer to forego genetic testing  (5) the patient had an 0.8 cm right lower lobe lung mass and a left posterior chest wall mass (likely hemangioma)   (a) repeat Chest CT 08/08/2015 shows the right lower lobe mass to be unchanged  (b) left  posterior chest wall mass has decreased in size c/w 03/01/2014 (6.2 cm to 3.4  cm)  (c) no further chest CT follow-up needed   (6) right breast biopsy 07/02/2015 shows a clinically T1c NX invasive lobular breast cancer,  Grade 2, strongly estrogen and progesterone receptor positive, with an MIB-1 of 5% and no HER-2 amplification   (7) anastrozole started September 2016 but interrupted initially because of peripheral neuropathy symptoms. These were felt to be unrelated and the patient's definitively resumed anastrozole October 2016  (a) osteoporosis, on alendronate--discontinued February 2017  METASTATIC DISEASE: (8) PET scan 12/09/2015 shows innumerable bone lesions. Lung lesions appear stable and are not hypermetabolic almond but are below the level of sensitivity of PET scan  (A) Denosumab/Xgeva started 12/17/2015  PLAN: I reviewed the results of Alejandra Grant's most recent scans with her. She was delighted to hear that the bone scan and chest CT show stable disease. Accordingly, we will continue on anastrozole and denosumab until there is evidence of spread, which Alejandra Grant is happy to do.   The labs were reviewed in detail. Her CA 27.29 interestingly enough is up and down, but there has been no major trend in either direction in the past few month.   Alejandra Grant will return monthly for her deonsumab injection. She will return in 3 months for follow up with Dr. Jana Hakim. She understands and agrees with this plan. She knows the goal of treatment in her case is control. She has been encouraged to call with any issues that might arise before her next visit here.   Alejandra Panda, NP   03/23/2016 2:02 PM

## 2016-03-23 NOTE — Telephone Encounter (Signed)
appt made and avs printed °

## 2016-04-05 ENCOUNTER — Telehealth: Payer: Self-pay | Admitting: *Deleted

## 2016-04-05 NOTE — Telephone Encounter (Signed)
"  I received a reminder call for appointments and need to know what I'm scheduled for.  My calendars read injection at 4:00 pm."  Advised she also has lab appointment at 3:30 pm. Wednesday June, 7th.

## 2016-04-06 ENCOUNTER — Other Ambulatory Visit: Payer: Medicare Other

## 2016-04-07 ENCOUNTER — Ambulatory Visit (HOSPITAL_BASED_OUTPATIENT_CLINIC_OR_DEPARTMENT_OTHER): Payer: Medicare Other

## 2016-04-07 ENCOUNTER — Other Ambulatory Visit (HOSPITAL_BASED_OUTPATIENT_CLINIC_OR_DEPARTMENT_OTHER): Payer: Medicare Other

## 2016-04-07 VITALS — BP 178/74 | HR 75 | Temp 98.5°F | Resp 18

## 2016-04-07 DIAGNOSIS — C50411 Malignant neoplasm of upper-outer quadrant of right female breast: Secondary | ICD-10-CM

## 2016-04-07 DIAGNOSIS — C50911 Malignant neoplasm of unspecified site of right female breast: Secondary | ICD-10-CM

## 2016-04-07 DIAGNOSIS — I700 Atherosclerosis of aorta: Secondary | ICD-10-CM

## 2016-04-07 DIAGNOSIS — C7951 Secondary malignant neoplasm of bone: Secondary | ICD-10-CM

## 2016-04-07 LAB — COMPREHENSIVE METABOLIC PANEL
ALBUMIN: 3.5 g/dL (ref 3.5–5.0)
ALT: 14 U/L (ref 0–55)
AST: 29 U/L (ref 5–34)
Alkaline Phosphatase: 59 U/L (ref 40–150)
Anion Gap: 9 mEq/L (ref 3–11)
BUN: 20 mg/dL (ref 7.0–26.0)
CO2: 27 meq/L (ref 22–29)
Calcium: 10.4 mg/dL (ref 8.4–10.4)
Chloride: 104 mEq/L (ref 98–109)
Creatinine: 0.8 mg/dL (ref 0.6–1.1)
EGFR: 67 mL/min/{1.73_m2} — ABNORMAL LOW (ref >90)
GLUCOSE: 95 mg/dL (ref 70–140)
POTASSIUM: 4 meq/L (ref 3.5–5.1)
SODIUM: 139 meq/L (ref 136–145)
Total Bilirubin: <0.3 mg/dL (ref 0.20–1.20)
Total Protein: 6.9 g/dL (ref 6.4–8.3)

## 2016-04-07 LAB — CBC WITH DIFFERENTIAL/PLATELET
BASO%: 0.6 % (ref 0.0–2.0)
BASOS ABS: 0 10*3/uL (ref 0.0–0.1)
EOS%: 2.2 % (ref 0.0–7.0)
Eosinophils Absolute: 0.2 10*3/uL (ref 0.0–0.5)
HCT: 33 % — ABNORMAL LOW (ref 34.8–46.6)
HEMOGLOBIN: 10.8 g/dL — AB (ref 11.6–15.9)
LYMPH%: 30.3 % (ref 14.0–49.7)
MCH: 28.4 pg (ref 25.1–34.0)
MCHC: 32.8 g/dL (ref 31.5–36.0)
MCV: 86.5 fL (ref 79.5–101.0)
MONO#: 0.8 10*3/uL (ref 0.1–0.9)
MONO%: 12 % (ref 0.0–14.0)
NEUT%: 54.9 % (ref 38.4–76.8)
NEUTROS ABS: 3.8 10*3/uL (ref 1.5–6.5)
Platelets: 233 10*3/uL (ref 145–400)
RBC: 3.81 10*6/uL (ref 3.70–5.45)
RDW: 19.9 % — AB (ref 11.2–14.5)
WBC: 7 10*3/uL (ref 3.9–10.3)
lymph#: 2.1 10*3/uL (ref 0.9–3.3)

## 2016-04-07 MED ORDER — DENOSUMAB 120 MG/1.7ML ~~LOC~~ SOLN
120.0000 mg | Freq: Once | SUBCUTANEOUS | Status: AC
  Administered 2016-04-07: 120 mg via SUBCUTANEOUS
  Filled 2016-04-07: qty 1.7

## 2016-04-07 NOTE — Patient Instructions (Signed)
Denosumab injection  What is this medicine?  DENOSUMAB (den oh sue mab) slows bone breakdown. Prolia is used to treat osteoporosis in women after menopause and in men. Xgeva is used to prevent bone fractures and other bone problems caused by cancer bone metastases. Xgeva is also used to treat giant cell tumor of the bone.  This medicine may be used for other purposes; ask your health care provider or pharmacist if you have questions.  What should I tell my health care provider before I take this medicine?  They need to know if you have any of these conditions:  -dental disease  -eczema  -infection or history of infections  -kidney disease or on dialysis  -low blood calcium or vitamin D  -malabsorption syndrome  -scheduled to have surgery or tooth extraction  -taking medicine that contains denosumab  -thyroid or parathyroid disease  -an unusual reaction to denosumab, other medicines, foods, dyes, or preservatives  -pregnant or trying to get pregnant  -breast-feeding  How should I use this medicine?  This medicine is for injection under the skin. It is given by a health care professional in a hospital or clinic setting.  If you are getting Prolia, a special MedGuide will be given to you by the pharmacist with each prescription and refill. Be sure to read this information carefully each time.  For Prolia, talk to your pediatrician regarding the use of this medicine in children. Special care may be needed. For Xgeva, talk to your pediatrician regarding the use of this medicine in children. While this drug may be prescribed for children as young as 13 years for selected conditions, precautions do apply.  Overdosage: If you think you have taken too much of this medicine contact a poison control center or emergency room at once.  NOTE: This medicine is only for you. Do not share this medicine with others.  What if I miss a dose?  It is important not to miss your dose. Call your doctor or health care professional if you are  unable to keep an appointment.  What may interact with this medicine?  Do not take this medicine with any of the following medications:  -other medicines containing denosumab  This medicine may also interact with the following medications:  -medicines that suppress the immune system  -medicines that treat cancer  -steroid medicines like prednisone or cortisone  This list may not describe all possible interactions. Give your health care provider a list of all the medicines, herbs, non-prescription drugs, or dietary supplements you use. Also tell them if you smoke, drink alcohol, or use illegal drugs. Some items may interact with your medicine.  What should I watch for while using this medicine?  Visit your doctor or health care professional for regular checks on your progress. Your doctor or health care professional may order blood tests and other tests to see how you are doing.  Call your doctor or health care professional if you get a cold or other infection while receiving this medicine. Do not treat yourself. This medicine may decrease your body's ability to fight infection.  You should make sure you get enough calcium and vitamin D while you are taking this medicine, unless your doctor tells you not to. Discuss the foods you eat and the vitamins you take with your health care professional.  See your dentist regularly. Brush and floss your teeth as directed. Before you have any dental work done, tell your dentist you are receiving this medicine.  Do   not become pregnant while taking this medicine or for 5 months after stopping it. Women should inform their doctor if they wish to become pregnant or think they might be pregnant. There is a potential for serious side effects to an unborn child. Talk to your health care professional or pharmacist for more information.  What side effects may I notice from receiving this medicine?  Side effects that you should report to your doctor or health care professional as soon as  possible:  -allergic reactions like skin rash, itching or hives, swelling of the face, lips, or tongue  -breathing problems  -chest pain  -fast, irregular heartbeat  -feeling faint or lightheaded, falls  -fever, chills, or any other sign of infection  -muscle spasms, tightening, or twitches  -numbness or tingling  -skin blisters or bumps, or is dry, peels, or red  -slow healing or unexplained pain in the mouth or jaw  -unusual bleeding or bruising  Side effects that usually do not require medical attention (Report these to your doctor or health care professional if they continue or are bothersome.):  -muscle pain  -stomach upset, gas  This list may not describe all possible side effects. Call your doctor for medical advice about side effects. You may report side effects to FDA at 1-800-FDA-1088.  Where should I keep my medicine?  This medicine is only given in a clinic, doctor's office, or other health care setting and will not be stored at home.  NOTE: This sheet is a summary. It may not cover all possible information. If you have questions about this medicine, talk to your doctor, pharmacist, or health care provider.      2016, Elsevier/Gold Standard. (2012-04-17 12:37:47)

## 2016-04-08 LAB — CANCER ANTIGEN 27.29: CAN 27.29: 344.4 U/mL — AB (ref 0.0–38.6)

## 2016-04-13 ENCOUNTER — Other Ambulatory Visit: Payer: Medicare Other

## 2016-04-13 ENCOUNTER — Ambulatory Visit: Payer: Medicare Other | Admitting: Oncology

## 2016-04-28 ENCOUNTER — Ambulatory Visit
Admission: RE | Admit: 2016-04-28 | Discharge: 2016-04-28 | Disposition: A | Discharge status: Home / Self Care | Payer: Medicare Other | Source: Ambulatory Visit | Attending: Oncology | Admitting: Oncology

## 2016-04-28 DIAGNOSIS — N6310 Unspecified lump in the right breast, unspecified quadrant: Secondary | ICD-10-CM

## 2016-04-28 DIAGNOSIS — N63 Unspecified lump in breast: Secondary | ICD-10-CM | POA: Diagnosis not present

## 2016-05-05 ENCOUNTER — Ambulatory Visit (HOSPITAL_BASED_OUTPATIENT_CLINIC_OR_DEPARTMENT_OTHER): Payer: Medicare Other

## 2016-05-05 ENCOUNTER — Other Ambulatory Visit (HOSPITAL_BASED_OUTPATIENT_CLINIC_OR_DEPARTMENT_OTHER): Payer: Medicare Other

## 2016-05-05 VITALS — BP 198/76 | HR 68 | Temp 97.9°F | Resp 20

## 2016-05-05 DIAGNOSIS — I700 Atherosclerosis of aorta: Secondary | ICD-10-CM

## 2016-05-05 DIAGNOSIS — C50411 Malignant neoplasm of upper-outer quadrant of right female breast: Secondary | ICD-10-CM

## 2016-05-05 DIAGNOSIS — C7951 Secondary malignant neoplasm of bone: Secondary | ICD-10-CM

## 2016-05-05 DIAGNOSIS — C50911 Malignant neoplasm of unspecified site of right female breast: Secondary | ICD-10-CM

## 2016-05-05 LAB — COMPREHENSIVE METABOLIC PANEL
ALBUMIN: 3.6 g/dL (ref 3.5–5.0)
ALT: 16 U/L (ref 0–55)
ANION GAP: 10 meq/L (ref 3–11)
AST: 29 U/L (ref 5–34)
Alkaline Phosphatase: 68 U/L (ref 40–150)
BUN: 23.1 mg/dL (ref 7.0–26.0)
CHLORIDE: 102 meq/L (ref 98–109)
CO2: 26 meq/L (ref 22–29)
Calcium: 10.5 mg/dL — ABNORMAL HIGH (ref 8.4–10.4)
Creatinine: 0.9 mg/dL (ref 0.6–1.1)
EGFR: 60 mL/min/{1.73_m2} — AB (ref >90)
GLUCOSE: 102 mg/dL (ref 70–140)
POTASSIUM: 4.4 meq/L (ref 3.5–5.1)
SODIUM: 138 meq/L (ref 136–145)
TOTAL PROTEIN: 7 g/dL (ref 6.4–8.3)

## 2016-05-05 LAB — CBC WITH DIFFERENTIAL/PLATELET
BASO%: 0.8 % (ref 0.0–2.0)
Basophils Absolute: 0.1 10*3/uL (ref 0.0–0.1)
EOS ABS: 0.1 10*3/uL (ref 0.0–0.5)
EOS%: 1.7 % (ref 0.0–7.0)
HCT: 32.2 % — ABNORMAL LOW (ref 34.8–46.6)
HEMOGLOBIN: 10.6 g/dL — AB (ref 11.6–15.9)
LYMPH%: 34.7 % (ref 14.0–49.7)
MCH: 28.7 pg (ref 25.1–34.0)
MCHC: 32.9 g/dL (ref 31.5–36.0)
MCV: 87.3 fL (ref 79.5–101.0)
MONO#: 1 10*3/uL — AB (ref 0.1–0.9)
MONO%: 13.5 % (ref 0.0–14.0)
NEUT%: 49.3 % (ref 38.4–76.8)
NEUTROS ABS: 3.5 10*3/uL (ref 1.5–6.5)
PLATELETS: 215 10*3/uL (ref 145–400)
RBC: 3.69 10*6/uL — ABNORMAL LOW (ref 3.70–5.45)
RDW: 19.4 % — AB (ref 11.2–14.5)
WBC: 7.2 10*3/uL (ref 3.9–10.3)
lymph#: 2.5 10*3/uL (ref 0.9–3.3)

## 2016-05-05 MED ORDER — DENOSUMAB 120 MG/1.7ML ~~LOC~~ SOLN
120.0000 mg | Freq: Once | SUBCUTANEOUS | Status: AC
  Administered 2016-05-05: 120 mg via SUBCUTANEOUS
  Filled 2016-05-05: qty 1.7

## 2016-05-05 NOTE — Patient Instructions (Signed)
Denosumab injection  What is this medicine?  DENOSUMAB (den oh sue mab) slows bone breakdown. Prolia is used to treat osteoporosis in women after menopause and in men. Xgeva is used to prevent bone fractures and other bone problems caused by cancer bone metastases. Xgeva is also used to treat giant cell tumor of the bone.  This medicine may be used for other purposes; ask your health care provider or pharmacist if you have questions.  What should I tell my health care provider before I take this medicine?  They need to know if you have any of these conditions:  -dental disease  -eczema  -infection or history of infections  -kidney disease or on dialysis  -low blood calcium or vitamin D  -malabsorption syndrome  -scheduled to have surgery or tooth extraction  -taking medicine that contains denosumab  -thyroid or parathyroid disease  -an unusual reaction to denosumab, other medicines, foods, dyes, or preservatives  -pregnant or trying to get pregnant  -breast-feeding  How should I use this medicine?  This medicine is for injection under the skin. It is given by a health care professional in a hospital or clinic setting.  If you are getting Prolia, a special MedGuide will be given to you by the pharmacist with each prescription and refill. Be sure to read this information carefully each time.  For Prolia, talk to your pediatrician regarding the use of this medicine in children. Special care may be needed. For Xgeva, talk to your pediatrician regarding the use of this medicine in children. While this drug may be prescribed for children as young as 13 years for selected conditions, precautions do apply.  Overdosage: If you think you have taken too much of this medicine contact a poison control center or emergency room at once.  NOTE: This medicine is only for you. Do not share this medicine with others.  What if I miss a dose?  It is important not to miss your dose. Call your doctor or health care professional if you are  unable to keep an appointment.  What may interact with this medicine?  Do not take this medicine with any of the following medications:  -other medicines containing denosumab  This medicine may also interact with the following medications:  -medicines that suppress the immune system  -medicines that treat cancer  -steroid medicines like prednisone or cortisone  This list may not describe all possible interactions. Give your health care provider a list of all the medicines, herbs, non-prescription drugs, or dietary supplements you use. Also tell them if you smoke, drink alcohol, or use illegal drugs. Some items may interact with your medicine.  What should I watch for while using this medicine?  Visit your doctor or health care professional for regular checks on your progress. Your doctor or health care professional may order blood tests and other tests to see how you are doing.  Call your doctor or health care professional if you get a cold or other infection while receiving this medicine. Do not treat yourself. This medicine may decrease your body's ability to fight infection.  You should make sure you get enough calcium and vitamin D while you are taking this medicine, unless your doctor tells you not to. Discuss the foods you eat and the vitamins you take with your health care professional.  See your dentist regularly. Brush and floss your teeth as directed. Before you have any dental work done, tell your dentist you are receiving this medicine.  Do   not become pregnant while taking this medicine or for 5 months after stopping it. Women should inform their doctor if they wish to become pregnant or think they might be pregnant. There is a potential for serious side effects to an unborn child. Talk to your health care professional or pharmacist for more information.  What side effects may I notice from receiving this medicine?  Side effects that you should report to your doctor or health care professional as soon as  possible:  -allergic reactions like skin rash, itching or hives, swelling of the face, lips, or tongue  -breathing problems  -chest pain  -fast, irregular heartbeat  -feeling faint or lightheaded, falls  -fever, chills, or any other sign of infection  -muscle spasms, tightening, or twitches  -numbness or tingling  -skin blisters or bumps, or is dry, peels, or red  -slow healing or unexplained pain in the mouth or jaw  -unusual bleeding or bruising  Side effects that usually do not require medical attention (Report these to your doctor or health care professional if they continue or are bothersome.):  -muscle pain  -stomach upset, gas  This list may not describe all possible side effects. Call your doctor for medical advice about side effects. You may report side effects to FDA at 1-800-FDA-1088.  Where should I keep my medicine?  This medicine is only given in a clinic, doctor's office, or other health care setting and will not be stored at home.  NOTE: This sheet is a summary. It may not cover all possible information. If you have questions about this medicine, talk to your doctor, pharmacist, or health care provider.      2016, Elsevier/Gold Standard. (2012-04-17 12:37:47)

## 2016-05-06 LAB — CANCER ANTIGEN 27.29: CA 27.29: 401.7 U/mL — ABNORMAL HIGH (ref 0.0–38.6)

## 2016-05-28 DIAGNOSIS — R42 Dizziness and giddiness: Secondary | ICD-10-CM | POA: Diagnosis not present

## 2016-05-28 DIAGNOSIS — R011 Cardiac murmur, unspecified: Secondary | ICD-10-CM | POA: Diagnosis not present

## 2016-05-28 DIAGNOSIS — F324 Major depressive disorder, single episode, in partial remission: Secondary | ICD-10-CM | POA: Diagnosis not present

## 2016-05-28 DIAGNOSIS — I10 Essential (primary) hypertension: Secondary | ICD-10-CM | POA: Diagnosis not present

## 2016-05-31 ENCOUNTER — Telehealth: Payer: Self-pay | Admitting: *Deleted

## 2016-05-31 NOTE — Telephone Encounter (Signed)
This RN spoke with pt per her call - Avella states she went to her primary MD and was checked out by him- the labs he obtained she does not know the results.  This RN informed pt she has not had a brain scan per this office - and discussed concerns relating to need to obtain.  Overall Alejandra Grant states general weakness - decreased appetite and weight loss - she denies any dizziness, falls or other changes in sensorium.  [name] did state that her primary MD was going to order the brain scan.  Plan per call is pt will follow up with primary MD to let him know we have not obtained a brain scan- if he does not order she is to call us for further recommendations.  No other needs at this time.

## 2016-05-31 NOTE — Telephone Encounter (Signed)
"  I saw my PCP Friday because I feel extremely weak, tired with no strength or energy and loosing weight the past three to four months.  Blood work done but I do not know results.  My PCP asked me if I had a head scan and told me to notify Dr. Jana Hakim of these symptoms."

## 2016-06-02 ENCOUNTER — Other Ambulatory Visit (HOSPITAL_BASED_OUTPATIENT_CLINIC_OR_DEPARTMENT_OTHER): Payer: Medicare Other

## 2016-06-02 ENCOUNTER — Other Ambulatory Visit: Payer: Self-pay

## 2016-06-02 ENCOUNTER — Ambulatory Visit (HOSPITAL_BASED_OUTPATIENT_CLINIC_OR_DEPARTMENT_OTHER): Payer: Medicare Other

## 2016-06-02 ENCOUNTER — Telehealth: Payer: Self-pay

## 2016-06-02 VITALS — BP 144/55 | HR 64 | Temp 98.8°F | Resp 18

## 2016-06-02 DIAGNOSIS — C50411 Malignant neoplasm of upper-outer quadrant of right female breast: Secondary | ICD-10-CM

## 2016-06-02 DIAGNOSIS — D508 Other iron deficiency anemias: Secondary | ICD-10-CM

## 2016-06-02 DIAGNOSIS — D6481 Anemia due to antineoplastic chemotherapy: Secondary | ICD-10-CM

## 2016-06-02 DIAGNOSIS — I700 Atherosclerosis of aorta: Secondary | ICD-10-CM

## 2016-06-02 DIAGNOSIS — C7951 Secondary malignant neoplasm of bone: Secondary | ICD-10-CM | POA: Diagnosis not present

## 2016-06-02 DIAGNOSIS — T451X5A Adverse effect of antineoplastic and immunosuppressive drugs, initial encounter: Secondary | ICD-10-CM

## 2016-06-02 DIAGNOSIS — D519 Vitamin B12 deficiency anemia, unspecified: Secondary | ICD-10-CM

## 2016-06-02 DIAGNOSIS — C50911 Malignant neoplasm of unspecified site of right female breast: Secondary | ICD-10-CM

## 2016-06-02 DIAGNOSIS — D630 Anemia in neoplastic disease: Secondary | ICD-10-CM

## 2016-06-02 LAB — CBC WITH DIFFERENTIAL/PLATELET
BASO%: 0.4 % (ref 0.0–2.0)
BASOS ABS: 0 10*3/uL (ref 0.0–0.1)
EOS ABS: 0.1 10*3/uL (ref 0.0–0.5)
EOS%: 1.4 % (ref 0.0–7.0)
HEMATOCRIT: 27.8 % — AB (ref 34.8–46.6)
HEMOGLOBIN: 9.2 g/dL — AB (ref 11.6–15.9)
LYMPH#: 1.6 10*3/uL (ref 0.9–3.3)
LYMPH%: 28.9 % (ref 14.0–49.7)
MCH: 28.8 pg (ref 25.1–34.0)
MCHC: 33.1 g/dL (ref 31.5–36.0)
MCV: 87.1 fL (ref 79.5–101.0)
MONO#: 0.8 10*3/uL (ref 0.1–0.9)
MONO%: 13.6 % (ref 0.0–14.0)
NEUT#: 3.2 10*3/uL (ref 1.5–6.5)
NEUT%: 55.7 % (ref 38.4–76.8)
PLATELETS: 199 10*3/uL (ref 145–400)
RBC: 3.19 10*6/uL — ABNORMAL LOW (ref 3.70–5.45)
RDW: 18.4 % — AB (ref 11.2–14.5)
WBC: 5.7 10*3/uL (ref 3.9–10.3)

## 2016-06-02 LAB — COMPREHENSIVE METABOLIC PANEL
ALBUMIN: 3.3 g/dL — AB (ref 3.5–5.0)
ALK PHOS: 71 U/L (ref 40–150)
ALT: 15 U/L (ref 0–55)
AST: 25 U/L (ref 5–34)
Anion Gap: 10 mEq/L (ref 3–11)
BUN: 20.3 mg/dL (ref 7.0–26.0)
CALCIUM: 10.3 mg/dL (ref 8.4–10.4)
CO2: 25 mEq/L (ref 22–29)
Chloride: 102 mEq/L (ref 98–109)
Creatinine: 0.8 mg/dL (ref 0.6–1.1)
EGFR: 66 mL/min/{1.73_m2} — AB (ref >90)
Glucose: 144 mg/dl — ABNORMAL HIGH (ref 70–140)
POTASSIUM: 4.4 meq/L (ref 3.5–5.1)
Sodium: 137 mEq/L (ref 136–145)
Total Bilirubin: <0.3 mg/dL (ref 0.20–1.20)
Total Protein: 6.5 g/dL (ref 6.4–8.3)

## 2016-06-02 MED ORDER — DENOSUMAB 120 MG/1.7ML ~~LOC~~ SOLN
120.0000 mg | Freq: Once | SUBCUTANEOUS | Status: AC
  Administered 2016-06-02: 120 mg via SUBCUTANEOUS
  Filled 2016-06-02: qty 1.7

## 2016-06-02 NOTE — Patient Instructions (Signed)
Denosumab injection  What is this medicine?  DENOSUMAB (den oh sue mab) slows bone breakdown. Prolia is used to treat osteoporosis in women after menopause and in men. Xgeva is used to prevent bone fractures and other bone problems caused by cancer bone metastases. Xgeva is also used to treat giant cell tumor of the bone.  This medicine may be used for other purposes; ask your health care provider or pharmacist if you have questions.  What should I tell my health care provider before I take this medicine?  They need to know if you have any of these conditions:  -dental disease  -eczema  -infection or history of infections  -kidney disease or on dialysis  -low blood calcium or vitamin D  -malabsorption syndrome  -scheduled to have surgery or tooth extraction  -taking medicine that contains denosumab  -thyroid or parathyroid disease  -an unusual reaction to denosumab, other medicines, foods, dyes, or preservatives  -pregnant or trying to get pregnant  -breast-feeding  How should I use this medicine?  This medicine is for injection under the skin. It is given by a health care professional in a hospital or clinic setting.  If you are getting Prolia, a special MedGuide will be given to you by the pharmacist with each prescription and refill. Be sure to read this information carefully each time.  For Prolia, talk to your pediatrician regarding the use of this medicine in children. Special care may be needed. For Xgeva, talk to your pediatrician regarding the use of this medicine in children. While this drug may be prescribed for children as young as 13 years for selected conditions, precautions do apply.  Overdosage: If you think you have taken too much of this medicine contact a poison control center or emergency room at once.  NOTE: This medicine is only for you. Do not share this medicine with others.  What if I miss a dose?  It is important not to miss your dose. Call your doctor or health care professional if you are  unable to keep an appointment.  What may interact with this medicine?  Do not take this medicine with any of the following medications:  -other medicines containing denosumab  This medicine may also interact with the following medications:  -medicines that suppress the immune system  -medicines that treat cancer  -steroid medicines like prednisone or cortisone  This list may not describe all possible interactions. Give your health care provider a list of all the medicines, herbs, non-prescription drugs, or dietary supplements you use. Also tell them if you smoke, drink alcohol, or use illegal drugs. Some items may interact with your medicine.  What should I watch for while using this medicine?  Visit your doctor or health care professional for regular checks on your progress. Your doctor or health care professional may order blood tests and other tests to see how you are doing.  Call your doctor or health care professional if you get a cold or other infection while receiving this medicine. Do not treat yourself. This medicine may decrease your body's ability to fight infection.  You should make sure you get enough calcium and vitamin D while you are taking this medicine, unless your doctor tells you not to. Discuss the foods you eat and the vitamins you take with your health care professional.  See your dentist regularly. Brush and floss your teeth as directed. Before you have any dental work done, tell your dentist you are receiving this medicine.  Do   not become pregnant while taking this medicine or for 5 months after stopping it. Women should inform their doctor if they wish to become pregnant or think they might be pregnant. There is a potential for serious side effects to an unborn child. Talk to your health care professional or pharmacist for more information.  What side effects may I notice from receiving this medicine?  Side effects that you should report to your doctor or health care professional as soon as  possible:  -allergic reactions like skin rash, itching or hives, swelling of the face, lips, or tongue  -breathing problems  -chest pain  -fast, irregular heartbeat  -feeling faint or lightheaded, falls  -fever, chills, or any other sign of infection  -muscle spasms, tightening, or twitches  -numbness or tingling  -skin blisters or bumps, or is dry, peels, or red  -slow healing or unexplained pain in the mouth or jaw  -unusual bleeding or bruising  Side effects that usually do not require medical attention (Report these to your doctor or health care professional if they continue or are bothersome.):  -muscle pain  -stomach upset, gas  This list may not describe all possible side effects. Call your doctor for medical advice about side effects. You may report side effects to FDA at 1-800-FDA-1088.  Where should I keep my medicine?  This medicine is only given in a clinic, doctor's office, or other health care setting and will not be stored at home.  NOTE: This sheet is a summary. It may not cover all possible information. If you have questions about this medicine, talk to your doctor, pharmacist, or health care provider.      2016, Elsevier/Gold Standard. (2012-04-17 12:37:47)

## 2016-06-02 NOTE — Telephone Encounter (Signed)
PT LM re: lab results from PCP Dr Moreen Fowler rcvd in clinic via fax yesterday. I returned call to pt - advised her to keep lab/injection appt today.  If her Hgb has dropped further I will discuss with covering MD.  Pt denies abnormal bruising/bleeding, dark tarry stools, nose bleeds, coffee grounds emesis.  I advised pt I will contact her by phone if follow up is needed.  Pt voiced understanding.

## 2016-06-02 NOTE — Telephone Encounter (Signed)
   Hgb 7/28 9.6.  Hgb 8/2 9.2.  Per Dr. Lindi Adie, order iron studies, B12, folate, ferritin and schedule OV with Dr. Jana Hakim.  Orders entered.  pof entered.

## 2016-06-03 ENCOUNTER — Telehealth: Payer: Self-pay | Admitting: Oncology

## 2016-06-03 LAB — CANCER ANTIGEN 27.29: CAN 27.29: 443.6 U/mL — AB (ref 0.0–38.6)

## 2016-06-03 NOTE — Telephone Encounter (Signed)
spke w/ pt confirmed 8/11 & 8/18 apt times

## 2016-06-04 ENCOUNTER — Telehealth: Payer: Self-pay

## 2016-06-04 ENCOUNTER — Ambulatory Visit (HOSPITAL_BASED_OUTPATIENT_CLINIC_OR_DEPARTMENT_OTHER): Payer: Medicare Other

## 2016-06-04 ENCOUNTER — Ambulatory Visit (HOSPITAL_BASED_OUTPATIENT_CLINIC_OR_DEPARTMENT_OTHER): Payer: Medicare Other | Admitting: Nurse Practitioner

## 2016-06-04 ENCOUNTER — Other Ambulatory Visit (HOSPITAL_BASED_OUTPATIENT_CLINIC_OR_DEPARTMENT_OTHER): Payer: Medicare Other

## 2016-06-04 VITALS — BP 118/69 | HR 62 | Temp 98.6°F | Resp 17 | Ht 61.0 in | Wt 104.2 lb

## 2016-06-04 DIAGNOSIS — C7951 Secondary malignant neoplasm of bone: Secondary | ICD-10-CM

## 2016-06-04 DIAGNOSIS — K069 Disorder of gingiva and edentulous alveolar ridge, unspecified: Secondary | ICD-10-CM

## 2016-06-04 DIAGNOSIS — E860 Dehydration: Secondary | ICD-10-CM

## 2016-06-04 DIAGNOSIS — C50411 Malignant neoplasm of upper-outer quadrant of right female breast: Secondary | ICD-10-CM

## 2016-06-04 DIAGNOSIS — R531 Weakness: Secondary | ICD-10-CM

## 2016-06-04 DIAGNOSIS — K068 Other specified disorders of gingiva and edentulous alveolar ridge: Secondary | ICD-10-CM

## 2016-06-04 LAB — CBC WITH DIFFERENTIAL/PLATELET
BASO%: 0.4 % (ref 0.0–2.0)
BASOS ABS: 0 10*3/uL (ref 0.0–0.1)
EOS%: 1.8 % (ref 0.0–7.0)
Eosinophils Absolute: 0.1 10*3/uL (ref 0.0–0.5)
HCT: 27.9 % — ABNORMAL LOW (ref 34.8–46.6)
HEMOGLOBIN: 9.1 g/dL — AB (ref 11.6–15.9)
LYMPH%: 26.3 % (ref 14.0–49.7)
MCH: 28.5 pg (ref 25.1–34.0)
MCHC: 32.6 g/dL (ref 31.5–36.0)
MCV: 87.5 fL (ref 79.5–101.0)
MONO#: 0.6 10*3/uL (ref 0.1–0.9)
MONO%: 11.9 % (ref 0.0–14.0)
NEUT#: 3.2 10*3/uL (ref 1.5–6.5)
NEUT%: 59.6 % (ref 38.4–76.8)
Platelets: 226 10*3/uL (ref 145–400)
RBC: 3.19 10*6/uL — AB (ref 3.70–5.45)
RDW: 17.9 % — AB (ref 11.2–14.5)
WBC: 5.4 10*3/uL (ref 3.9–10.3)
lymph#: 1.4 10*3/uL (ref 0.9–3.3)

## 2016-06-04 LAB — URINALYSIS, MICROSCOPIC - CHCC
BILIRUBIN (URINE): NEGATIVE
Blood: NEGATIVE
Glucose: NEGATIVE mg/dL
KETONES: NEGATIVE mg/dL
Leukocyte Esterase: NEGATIVE
NITRITE: NEGATIVE
Protein: NEGATIVE mg/dL
Specific Gravity, Urine: 1.005 (ref 1.003–1.035)
Urobilinogen, UR: 0.2 mg/dL (ref 0.2–1)
pH: 7.5 (ref 4.6–8.0)

## 2016-06-04 LAB — COMPREHENSIVE METABOLIC PANEL
ALBUMIN: 3.1 g/dL — AB (ref 3.5–5.0)
ALT: 12 U/L (ref 0–55)
AST: 24 U/L (ref 5–34)
Alkaline Phosphatase: 71 U/L (ref 40–150)
Anion Gap: 10 mEq/L (ref 3–11)
BUN: 20.4 mg/dL (ref 7.0–26.0)
CHLORIDE: 100 meq/L (ref 98–109)
CO2: 24 meq/L (ref 22–29)
Calcium: 9.3 mg/dL (ref 8.4–10.4)
Creatinine: 0.8 mg/dL (ref 0.6–1.1)
EGFR: 69 mL/min/{1.73_m2} — AB (ref >90)
GLUCOSE: 123 mg/dL (ref 70–140)
POTASSIUM: 4 meq/L (ref 3.5–5.1)
SODIUM: 134 meq/L — AB (ref 136–145)
Total Bilirubin: 0.32 mg/dL (ref 0.20–1.20)
Total Protein: 6.4 g/dL (ref 6.4–8.3)

## 2016-06-04 MED ORDER — SODIUM CHLORIDE 0.9 % IV SOLN
INTRAVENOUS | Status: AC
  Administered 2016-06-04: 15:00:00 via INTRAVENOUS

## 2016-06-04 NOTE — Patient Instructions (Signed)

## 2016-06-04 NOTE — Telephone Encounter (Signed)
Pt called stating she is having a really rough time of it and is extremely weak. Her hgb was too high for a blood transfusion on Wed.  Called pt back and she is concerned about the weakness. She denies any urinary symptoms, she is a tad bit nauseated. She denies SOB. S/w Cyndee bacon and will set up [facility] visit today. Pt is arranging transportation.

## 2016-06-05 LAB — URINE CULTURE: Organism ID, Bacteria: NO GROWTH

## 2016-06-09 ENCOUNTER — Encounter: Payer: Self-pay | Admitting: Nurse Practitioner

## 2016-06-09 DIAGNOSIS — E860 Dehydration: Secondary | ICD-10-CM | POA: Insufficient documentation

## 2016-06-09 DIAGNOSIS — K068 Other specified disorders of gingiva and edentulous alveolar ridge: Secondary | ICD-10-CM | POA: Insufficient documentation

## 2016-06-09 NOTE — Assessment & Plan Note (Signed)
Patient received her last Xgeva for bone support on 06/02/2016.  Patient continues to complain of fatigue, and dehydration.  Labs obtained today reveal WBC 5.4, ANC 3.2, hemoglobin has decreased from 9.2 down to 9.1, and platelet count is 226.  Patient is scheduled for labs only on 06/11/2016.  That time she will have a B-12 level, iron/TIBC, ferritin, and folate drawn.  She will follow up with Dr. Jana Hakim on 06/18/2016.  She knows to come in to the [facility] or go directly to the emergency department for any worsening symptoms in the interim.

## 2016-06-09 NOTE — Assessment & Plan Note (Signed)
Patient states that she has had a lesion to her left lower outer gum for the past 3-4 weeks.  She states that her dentist has informed her that she has a ulcer on the bone of her jaw; and is treating her with some mouth rinses.  She states that this pain to her gum has made it more difficult to eat; and she feels weak and dehydrated today.  Exam today reveals a obvious white firm lesion to the left outer lower gum area.  Area is tender to palpation; but there is no surrounding edema, erythema, or drainage.  Will give patient IV fluid rehydration while the cancer Center today.  Patient was encouraged to follow-up with her dentist again regarding the gum lesion.  Also, will review findings with Dr. Jana Hakim as well.

## 2016-06-09 NOTE — Progress Notes (Signed)
SYMPTOM MANAGEMENT CLINIC    Chief Complaint: Dehydration, gum lesion  HPI:  ISSA LUSTER 82 y.o. female diagnosed with breast cancer with bone metastasis.  Currently undergoing Xgeva therapy.  Patient presents to the [facility] today with increased fatigue and dehydration.  Patient also has a lesion to the left, that is bothering her.  She denies any.    No history exists.    Review of Systems  Constitutional: Positive for malaise/fatigue.  All other systems reviewed and are negative.   Past Medical History:  Diagnosis Date  . Anemia   . Anxiety   . 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 Bone cancer ([place])   . Breast cancer ([place])   . Cancer (HCC)    breast, skin  . Depression   . Heart murmur   . Hypertension   . Paresthesia 08/21/2015  . Urinary incontinence     Past Surgical History:  Procedure Laterality Date  . ABDOMINAL HYSTERECTOMY    . BACK SURGERY    . BREAST LUMPECTOMY Bilateral    left 1991,right side 1994  . EYE SURGERY Bilateral    cat  . PARTIAL MASTECTOMY WITH NEEDLE LOCALIZATION Right 05/21/2013   Procedure: PARTIAL MASTECTOMY WITH NEEDLE LOCALIZATION;  Surgeon: Adin Hector, MD;  Location: [place] [facility];  Service: General;  Laterality: Right;  . TONSILLECTOMY      has Breast cancer of upper-outer quadrant of right female breast ([place]); Hematoma of chest wall; Rib fractures; Recurrent cancer of right breast ([place]); Neuropathy ([place]); Paresthesia; Osseous metastasis ([place]); Gum lesion; and Dehydration on her problem list.    is allergic to aspirin.    Medication List       Accurate as of 06/04/16 11:59 PM. Always use your most recent med list.          anastrozole 1 MG tablet Commonly known as:  ARIMIDEX Take 1 tablet (1 mg total) by mouth daily.   CALCIUM PO Take 1 tablet by mouth daily.   carvedilol 3.125 MG tablet Commonly known as:  COREG 1 tablet 2 (two) times daily.   FLUoxetine 20 MG capsule Commonly known as:   PROZAC Take 20 mg by mouth every other day.   hydrALAZINE 25 MG tablet Commonly known as:  APRESOLINE Take 50 mg by mouth 4 (four) times daily.   ICAPS MV PO Take by mouth 1 day or 1 dose.   mirtazapine 30 MG tablet Commonly known as:  REMERON Take 30 mg by mouth at bedtime.   potassium chloride 8 MEQ tablet Commonly known as:  KLOR-CON Take 16 mEq by mouth 2 (two) times daily.   prednisoLONE acetate 1 % ophthalmic suspension Commonly known as:  PRED FORTE Place 1 drop into the right eye 2 (two) times daily.   rOPINIRole 1 MG tablet Commonly known as:  REQUIP Take 1 mg by mouth at bedtime.   timolol 0.5 % ophthalmic solution Commonly known as:  TIMOPTIC Place 1 drop into both eyes 2 (two) times daily.   traMADol 50 MG tablet Commonly known as:  ULTRAM Take 50-100 mg by mouth every 6 (six) hours as needed for moderate pain.   TRIBENZOR 40-10-25 MG Tabs Generic drug:  Olmesartan-Amlodipine-HCTZ Take 1 tablet by mouth every morning.        PHYSICAL EXAMINATION  Oncology Vitals 06/04/2016 06/04/2016  Height - 155 cm  Weight - 47.265 kg  Weight (lbs) - 104 lbs 3 oz  BMI (kg/m2) -  19.69 kg/m2  Temp 97.9 98.6  Pulse 70 62  Resp 18 17  SpO2 99 98  BSA (m2) - 1.43 m2   BP Readings from Last 2 Encounters:  06/04/16 (!) 160/67  06/04/16 118/69    Physical Exam  Constitutional: She is oriented to person, place, and time. Vital signs are normal. She appears dehydrated. She appears unhealthy.  HENT:  Head: Normocephalic and atraumatic.  Exam today reveals a obvious white firm lesion to the left outer lower gum area.  Area is tender to palpation; but there is no surrounding edema, erythema, or drainage.    Eyes:  Patient is legally blind.  She uses a cane and has a nurse's aide with her for assistance.  Neck: Normal range of motion. Neck supple.  Pulmonary/Chest: Effort normal. No respiratory distress.  Musculoskeletal: Normal range of motion. She exhibits no edema  or tenderness.  Neurological: She is alert and oriented to person, place, and time.  Skin: Skin is warm and dry. No rash noted. No erythema. No pallor.  Psychiatric: Affect normal.  Nursing note and vitals reviewed.   LABORATORY DATA:. Appointment on 06/04/2016  Component Date Value Ref Range Status  . WBC 06/04/2016 5.4  3.9 - 10.3 10e3/uL Final  . NEUT# 06/04/2016 3.2  1.5 - 6.5 10e3/uL Final  . HGB 06/04/2016 9.1* 11.6 - 15.9 g/dL Final  . HCT 06/04/2016 27.9* 34.8 - 46.6 % Final  . Platelets 06/04/2016 226  145 - 400 10e3/uL Final  . MCV 06/04/2016 87.5  79.5 - 101.0 fL Final  . MCH 06/04/2016 28.5  25.1 - 34.0 pg Final  . MCHC 06/04/2016 32.6  31.5 - 36.0 g/dL Final  . RBC 06/04/2016 3.19* 3.70 - 5.45 10e6/uL Final  . RDW 06/04/2016 17.9* 11.2 - 14.5 % Final  . lymph# 06/04/2016 1.4  0.9 - 3.3 10e3/uL Final  . MONO# 06/04/2016 0.6  0.1 - 0.9 10e3/uL Final  . Eosinophils Absolute 06/04/2016 0.1  0.0 - 0.5 10e3/uL Final  . Basophils Absolute 06/04/2016 0.0  0.0 - 0.1 10e3/uL Final  . NEUT% 06/04/2016 59.6  38.4 - 76.8 % Final  . LYMPH% 06/04/2016 26.3  14.0 - 49.7 % Final  . MONO% 06/04/2016 11.9  0.0 - 14.0 % Final  . EOS% 06/04/2016 1.8  0.0 - 7.0 % Final  . BASO% 06/04/2016 0.4  0.0 - 2.0 % Final  . Sodium 06/04/2016 134* 136 - 145 mEq/L Final  . Potassium 06/04/2016 4.0  3.5 - 5.1 mEq/L Final  . Chloride 06/04/2016 100  98 - 109 mEq/L Final  . CO2 06/04/2016 24  22 - 29 mEq/L Final  . Glucose 06/04/2016 123  70 - 140 mg/dl Final  . BUN 06/04/2016 20.4  7.0 - 26.0 mg/dL Final  . Creatinine 06/04/2016 0.8  0.6 - 1.1 mg/dL Final  . Total Bilirubin 06/04/2016 0.32  0.20 - 1.20 mg/dL Final  . Alkaline Phosphatase 06/04/2016 71  40 - 150 U/L Final  . AST 06/04/2016 24  5 - 34 U/L Final  . ALT 06/04/2016 12  0 - 55 U/L Final  . Total Protein 06/04/2016 6.4  6.4 - 8.3 g/dL Final  . Albumin 06/04/2016 3.1* 3.5 - 5.0 g/dL Final  . Calcium 06/04/2016 9.3  8.4 - 10.4 mg/dL Final    . Anion Gap 06/04/2016 10  3 - 11 mEq/L Final  . EGFR 06/04/2016 69* >90 ml/min/1.73 m2 Final  . Glucose 06/04/2016 Negative  Negative mg/dL Final  . Bilirubin (  Urine) 06/04/2016 Negative  Negative Final  . Ketones 06/04/2016 Negative  Negative mg/dL Final  . Specific Gravity, Urine 06/04/2016 1.005  1.003 - 1.035 Final  . Blood 06/04/2016 Negative  Negative Final  . pH 06/04/2016 7.5  4.6 - 8.0 Final  . Protein 06/04/2016 Negative  Negative- <30 mg/dL Final  . Urobilinogen, UR 06/04/2016 0.2  0.2 - 1 mg/dL Final  . Nitrite 06/04/2016 Negative  Negative Final  . Leukocyte Esterase 06/04/2016 Negative  Negative Final  . RBC / HPF 06/04/2016 0-2  0 - 2 Final  . WBC, UA 06/04/2016 0-2  0 - 2 Final  . Bacteria, UA 06/04/2016 Trace  Negative- Trace Final  . Epithelial Cells 06/04/2016 Occasional  Negative- Few Final  . Urine Culture, Routine 06/05/2016 Final report   Final  . Urine Culture result 1 06/05/2016 No growth   Final  Appointment on 06/02/2016  Component Date Value Ref Range Status  . WBC 06/02/2016 5.7  3.9 - 10.3 10e3/uL Final  . NEUT# 06/02/2016 3.2  1.5 - 6.5 10e3/uL Final  . HGB 06/02/2016 9.2* 11.6 - 15.9 g/dL Final  . HCT 06/02/2016 27.8* 34.8 - 46.6 % Final  . Platelets 06/02/2016 199  145 - 400 10e3/uL Final  . MCV 06/02/2016 87.1  79.5 - 101.0 fL Final  . MCH 06/02/2016 28.8  25.1 - 34.0 pg Final  . MCHC 06/02/2016 33.1  31.5 - 36.0 g/dL Final  . RBC 06/02/2016 3.19* 3.70 - 5.45 10e6/uL Final  . RDW 06/02/2016 18.4* 11.2 - 14.5 % Final  . lymph# 06/02/2016 1.6  0.9 - 3.3 10e3/uL Final  . MONO# 06/02/2016 0.8  0.1 - 0.9 10e3/uL Final  . Eosinophils Absolute 06/02/2016 0.1  0.0 - 0.5 10e3/uL Final  . Basophils Absolute 06/02/2016 0.0  0.0 - 0.1 10e3/uL Final  . NEUT% 06/02/2016 55.7  38.4 - 76.8 % Final  . LYMPH% 06/02/2016 28.9  14.0 - 49.7 % Final  . MONO% 06/02/2016 13.6  0.0 - 14.0 % Final  . EOS% 06/02/2016 1.4  0.0 - 7.0 % Final  . BASO% 06/02/2016 0.4  0.0  - 2.0 % Final  . Sodium 06/02/2016 137  136 - 145 mEq/L Final  . Potassium 06/02/2016 4.4  3.5 - 5.1 mEq/L Final  . Chloride 06/02/2016 102  98 - 109 mEq/L Final  . CO2 06/02/2016 25  22 - 29 mEq/L Final  . Glucose 06/02/2016 144* 70 - 140 mg/dl Final  . BUN 06/02/2016 20.3  7.0 - 26.0 mg/dL Final  . Creatinine 06/02/2016 0.8  0.6 - 1.1 mg/dL Final  . Total Bilirubin 06/02/2016 <0.30  0.20 - 1.20 mg/dL Final  . Alkaline Phosphatase 06/02/2016 71  40 - 150 U/L Final  . AST 06/02/2016 25  5 - 34 U/L Final  . ALT 06/02/2016 15  0 - 55 U/L Final  . Total Protein 06/02/2016 6.5  6.4 - 8.3 g/dL Final  . Albumin 06/02/2016 3.3* 3.5 - 5.0 g/dL Final  . Calcium 06/02/2016 10.3  8.4 - 10.4 mg/dL Final  . Anion Gap 06/02/2016 10  3 - 11 mEq/L Final  . EGFR 06/02/2016 66* >90 ml/min/1.73 m2 Final  . CA 27.29 06/03/2016 443.6* 0.0 - 38.6 U/mL Final    RADIOGRAPHIC STUDIES: No results found.  ASSESSMENT/PLAN:    Gum lesion Patient states that she has had a lesion to her left lower outer gum for the past 3-4 weeks.  She states that her dentist has informed her that  she has a ulcer on the bone of her jaw; and is treating her with some mouth rinses.  She states that this pain to her gum has made it more difficult to eat; and she feels weak and dehydrated today.  Exam today reveals a obvious white firm lesion to the left outer lower gum area.  Area is tender to palpation; but there is no surrounding edema, erythema, or drainage.  Will give patient IV fluid rehydration while the cancer Center today.  Patient was encouraged to follow-up with her dentist again regarding the gum lesion.  Also, will review findings with Dr. Jana Hakim as well.  Dehydration Patient states that she has had a lesion to her left lower outer gum for the past 3-4 weeks.  She states that her dentist has informed her that she has a ulcer on the bone of her jaw; and is treating her with some mouth rinses.  She states that this pain to  her gum has made it more difficult to eat; and she feels weak and dehydrated today.  Exam today reveals a obvious white firm lesion to the left outer lower gum area.  Area is tender to palpation; but there is no surrounding edema, erythema, or drainage.  Will give patient IV fluid rehydration while the cancer Center today.  Patient was encouraged to follow-up with her dentist again regarding the gum lesion.  Also, will review findings with Dr. Jana Hakim as well.  Breast cancer of upper-outer quadrant of right female breast [facility]) Patient received her last Xgeva for bone support on 06/02/2016.  Patient continues to complain of fatigue, and dehydration.  Labs obtained today reveal WBC 5.4, ANC 3.2, hemoglobin has decreased from 9.2 down to 9.1, and platelet count is 226.  Patient is scheduled for labs only on 06/11/2016.  That time she will have a B-12 level, iron/TIBC, ferritin, and folate drawn.  She will follow up with Dr. Jana Hakim on 06/18/2016.  She knows to come in to the [facility] or go directly to the emergency department for any worsening symptoms in the interim.   Patient stated understanding of all instructions; and was in agreement with this plan of care. The patient knows to call the clinic with any problems, questions or concerns.   Total time spent with patient was 25 minutes;  with greater than 95 percent of that time spent in face to face counseling regarding patient's symptoms,  and coordination of care and follow up.  Disclaimer:This dictation was prepared with Dragon/digital dictation along with [employer]. Any transcriptional errors that result from this process are unintentional.  Drue Second, NP 06/09/2016

## 2016-06-10 ENCOUNTER — Telehealth: Payer: Self-pay | Admitting: *Deleted

## 2016-06-10 ENCOUNTER — Other Ambulatory Visit: Payer: Self-pay | Admitting: Oncology

## 2016-06-10 DIAGNOSIS — K068 Other specified disorders of gingiva and edentulous alveolar ridge: Secondary | ICD-10-CM

## 2016-06-10 NOTE — Telephone Encounter (Signed)
TC from patient requesting to confirm appt for lab work tomorrow.  Appt is at 11:15am. Pt voiced understanding.  No other concerns at this time.

## 2016-06-11 ENCOUNTER — Other Ambulatory Visit (HOSPITAL_BASED_OUTPATIENT_CLINIC_OR_DEPARTMENT_OTHER): Payer: Medicare Other

## 2016-06-11 DIAGNOSIS — C50411 Malignant neoplasm of upper-outer quadrant of right female breast: Secondary | ICD-10-CM | POA: Diagnosis not present

## 2016-06-11 DIAGNOSIS — C50911 Malignant neoplasm of unspecified site of right female breast: Secondary | ICD-10-CM

## 2016-06-11 DIAGNOSIS — D508 Other iron deficiency anemias: Secondary | ICD-10-CM | POA: Diagnosis not present

## 2016-06-11 DIAGNOSIS — D6481 Anemia due to antineoplastic chemotherapy: Secondary | ICD-10-CM | POA: Diagnosis not present

## 2016-06-11 DIAGNOSIS — D630 Anemia in neoplastic disease: Secondary | ICD-10-CM | POA: Diagnosis not present

## 2016-06-11 DIAGNOSIS — D519 Vitamin B12 deficiency anemia, unspecified: Secondary | ICD-10-CM | POA: Diagnosis not present

## 2016-06-11 DIAGNOSIS — C7951 Secondary malignant neoplasm of bone: Secondary | ICD-10-CM | POA: Diagnosis not present

## 2016-06-11 DIAGNOSIS — I700 Atherosclerosis of aorta: Secondary | ICD-10-CM

## 2016-06-11 DIAGNOSIS — T451X5A Adverse effect of antineoplastic and immunosuppressive drugs, initial encounter: Secondary | ICD-10-CM | POA: Diagnosis not present

## 2016-06-11 LAB — IRON AND TIBC
%SAT: 29 % (ref 21–57)
IRON: 76 ug/dL (ref 41–142)
TIBC: 258 ug/dL (ref 236–444)
UIBC: 182 ug/dL (ref 120–384)

## 2016-06-11 LAB — COMPREHENSIVE METABOLIC PANEL
ALBUMIN: 3.2 g/dL — AB (ref 3.5–5.0)
ALK PHOS: 76 U/L (ref 40–150)
ALT: 12 U/L (ref 0–55)
ANION GAP: 8 meq/L (ref 3–11)
AST: 26 U/L (ref 5–34)
BUN: 15 mg/dL (ref 7.0–26.0)
CALCIUM: 10.2 mg/dL (ref 8.4–10.4)
CO2: 27 mEq/L (ref 22–29)
Chloride: 99 mEq/L (ref 98–109)
Creatinine: 0.7 mg/dL (ref 0.6–1.1)
EGFR: 76 mL/min/{1.73_m2} — AB (ref >90)
Glucose: 90 mg/dl (ref 70–140)
POTASSIUM: 4.4 meq/L (ref 3.5–5.1)
SODIUM: 134 meq/L — AB (ref 136–145)
Total Protein: 6.4 g/dL (ref 6.4–8.3)

## 2016-06-11 LAB — CBC WITH DIFFERENTIAL/PLATELET
BASO%: 0.3 % (ref 0.0–2.0)
BASOS ABS: 0 10*3/uL (ref 0.0–0.1)
EOS ABS: 0.1 10*3/uL (ref 0.0–0.5)
EOS%: 1.9 % (ref 0.0–7.0)
HEMATOCRIT: 26.2 % — AB (ref 34.8–46.6)
HEMOGLOBIN: 8.7 g/dL — AB (ref 11.6–15.9)
LYMPH%: 34 % (ref 14.0–49.7)
MCH: 28.4 pg (ref 25.1–34.0)
MCHC: 33.2 g/dL (ref 31.5–36.0)
MCV: 85.6 fL (ref 79.5–101.0)
MONO#: 0.7 10*3/uL (ref 0.1–0.9)
MONO%: 12.1 % (ref 0.0–14.0)
NEUT#: 3 10*3/uL (ref 1.5–6.5)
NEUT%: 51.7 % (ref 38.4–76.8)
PLATELETS: 219 10*3/uL (ref 145–400)
RBC: 3.06 10*6/uL — ABNORMAL LOW (ref 3.70–5.45)
RDW: 18 % — AB (ref 11.2–14.5)
WBC: 5.9 10*3/uL (ref 3.9–10.3)
lymph#: 2 10*3/uL (ref 0.9–3.3)

## 2016-06-11 LAB — FERRITIN: FERRITIN: 233 ng/mL (ref 9–269)

## 2016-06-12 LAB — FOLATE: FOLATE: 9.9 ng/mL (ref >3.0)

## 2016-06-12 LAB — VITAMIN B12: Vitamin B12: 199 pg/mL — ABNORMAL LOW (ref 211–946)

## 2016-06-13 ENCOUNTER — Other Ambulatory Visit: Payer: Self-pay | Admitting: Oncology

## 2016-06-13 DIAGNOSIS — D519 Vitamin B12 deficiency anemia, unspecified: Secondary | ICD-10-CM

## 2016-06-14 ENCOUNTER — Other Ambulatory Visit (HOSPITAL_COMMUNITY): Payer: Self-pay | Admitting: Dentistry

## 2016-06-15 ENCOUNTER — Telehealth: Payer: Self-pay | Admitting: Oncology

## 2016-06-15 DIAGNOSIS — H31012 Macula scars of posterior pole (postinflammatory) (post-traumatic), left eye: Secondary | ICD-10-CM | POA: Diagnosis not present

## 2016-06-15 DIAGNOSIS — H35022 Exudative retinopathy, left eye: Secondary | ICD-10-CM | POA: Diagnosis not present

## 2016-06-15 DIAGNOSIS — H18421 Band keratopathy, right eye: Secondary | ICD-10-CM | POA: Diagnosis not present

## 2016-06-15 DIAGNOSIS — H353230 Exudative age-related macular degeneration, bilateral, stage unspecified: Secondary | ICD-10-CM | POA: Diagnosis not present

## 2016-06-15 DIAGNOSIS — D1809 Hemangioma of other sites: Secondary | ICD-10-CM | POA: Diagnosis not present

## 2016-06-15 NOTE — Telephone Encounter (Signed)
Spoke w/ pt to confirm new appt time for 8/18 visit

## 2016-06-17 ENCOUNTER — Ambulatory Visit (HOSPITAL_COMMUNITY): Payer: Self-pay | Admitting: Dentistry

## 2016-06-17 VITALS — BP 175/57 | HR 57 | Temp 98.0°F

## 2016-06-17 DIAGNOSIS — K055 Other periodontal diseases: Secondary | ICD-10-CM

## 2016-06-17 DIAGNOSIS — IMO0002 Reserved for concepts with insufficient information to code with codable children: Secondary | ICD-10-CM

## 2016-06-17 DIAGNOSIS — K0889 Other specified disorders of teeth and supporting structures: Secondary | ICD-10-CM

## 2016-06-17 DIAGNOSIS — C7951 Secondary malignant neoplasm of bone: Secondary | ICD-10-CM | POA: Diagnosis not present

## 2016-06-17 DIAGNOSIS — K036 Deposits [accretions] on teeth: Secondary | ICD-10-CM

## 2016-06-17 DIAGNOSIS — K1370 Unspecified lesions of oral mucosa: Secondary | ICD-10-CM

## 2016-06-17 DIAGNOSIS — R29898 Other symptoms and signs involving the musculoskeletal system: Secondary | ICD-10-CM

## 2016-06-17 DIAGNOSIS — C50411 Malignant neoplasm of upper-outer quadrant of right female breast: Secondary | ICD-10-CM | POA: Diagnosis present

## 2016-06-17 DIAGNOSIS — K0530 Chronic periodontitis, unspecified: Secondary | ICD-10-CM

## 2016-06-17 DIAGNOSIS — M270 Developmental disorders of jaws: Secondary | ICD-10-CM

## 2016-06-17 NOTE — Patient Instructions (Signed)
Patient is a follow-up with her primary dentist Dr. Osa Craver for evaluation of exposed bone and 12 months. Patient to call dental medicine if she wishes a follow-up appointment here. Dr. Enrique Sack

## 2016-06-17 NOTE — Progress Notes (Signed)
DENTAL CONSULTATION  Date of Consultation:  06/17/2016 Patient Name:   Alejandra Grant Date of Birth:   [birth year] Medical Record Number: [id number]  VITALS: BP (!) 175/57 (BP Location: Left Arm)   Pulse (!) 57   Temp 98 F (36.7 C) (Oral)   CHIEF COMPLAINT: The patient was referred by Dr. Jana Hakim for evaluation of the mandibular left gum lesion.   HPI: Alejandra Grant is a 82 year old female with history of right breast cancer with osseous metastases. Patient is being followed by Dr. Jana Hakim. Patient currently on Arimidex daily therapy. Patient has a history of 7 IV doses of Xgeva for the osseous metastases. Patient is being referred for evaluation of mandibular left gum lesion and to rule out possible medication-elated osteonecrosis of the jaw.  Patient gives a history of having "an ulcer on bone " in the area of the lower left first molar #19 on the buccal.  This has been present for approximately 2 weeks by patient report. Patient describes some discomfort primarily with eating. Patient indicates that the pain reaches an intensity of 3-4 out of 10 but is currently 0 out of 10 in intensity today. Patient indicates that the pain is primarily present when she is eating. Patient tries to avoid the area by chewing on the right side. Patient indicates that she has been applying the chlorhexidine rinse with a Q-tip 4 times daily with minimal relief.  The patient indicates that she has been evaluated by her primary dentist, Dr. Demetrius Revel, for this problem. Patient was recently seen on 05/26/2016 for an exam and cleaning with Dr. Rennie Natter office.  Patient also has been complaining of some discomfort in the area of tooth #18 that experiences a sharp pain for seconds only over the past several months.  Upon further discussion with the patient, it was determined that this discomfort was felt to be related to her occlusion.  Patient has been using a hard acrylic splint/mouth guard at bedtime  for possible clenching/bruxism habits.  Patient denies having dental phobia.  PROBLEM LIST: Patient Active Problem List   Diagnosis Date Noted  . Gum lesion 06/09/2016  . Dehydration 06/09/2016  . Osseous metastasis ([place]) 11/20/2015  . Paresthesia 08/21/2015  . Recurrent cancer of right breast ([place]) 08/12/2015  . Neuropathy ([place]) 08/12/2015  . Hematoma of chest wall 04/26/2014  . Rib fractures 04/26/2014  . Breast cancer of upper-outer quadrant of right female breast ([place]) 04/20/2013    PMH: Past Medical History:  Diagnosis Date  . Anemia   . Anxiety   . 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 Bone cancer ([place])   . Breast cancer ([place])   . Cancer (HCC)    breast, skin  . Depression   . Heart murmur   . Hypertension   . Paresthesia 08/21/2015  . Urinary incontinence     PSH: Past Surgical History:  Procedure Laterality Date  . ABDOMINAL HYSTERECTOMY    . BACK SURGERY    . BREAST LUMPECTOMY Bilateral    left 1991,right side 1994  . EYE SURGERY Bilateral    cat  . PARTIAL MASTECTOMY WITH NEEDLE LOCALIZATION Right 05/21/2013   Procedure: PARTIAL MASTECTOMY WITH NEEDLE LOCALIZATION;  Surgeon: Adin Hector, MD;  Location: [place] [facility];  Service: General;  Laterality: Right;  . TONSILLECTOMY      ALLERGIES: Allergies  Allergen Reactions  . Aspirin     Bleeding / rash    MEDICATIONS: Current Outpatient Prescriptions  Medication Sig Dispense Refill  . anastrozole (ARIMIDEX) 1 MG tablet Take 1 tablet (1 mg total) by mouth daily. 90 tablet 3  . CALCIUM PO Take 1 tablet by mouth daily.    . carvedilol (COREG) 3.125 MG tablet 1 tablet 2 (two) times daily.    Marland Kitchen FLUoxetine (PROZAC) 20 MG capsule Take 20 mg by mouth every other day.     . hydrALAZINE (APRESOLINE) 25 MG tablet Take 50 mg by mouth 4 (four) times daily.     . mirtazapine (REMERON) 30 MG tablet Take 30 mg by mouth at bedtime.     . Multiple Vitamins-Minerals (ICAPS MV PO) Take by mouth 1 day or 1  dose.    . potassium chloride (KLOR-CON) 8 MEQ tablet Take 16 mEq by mouth 2 (two) times daily.    . prednisoLONE acetate (PRED FORTE) 1 % ophthalmic suspension Place 1 drop into the right eye 2 (two) times daily.    Marland Kitchen rOPINIRole (REQUIP) 1 MG tablet Take 1 mg by mouth at bedtime.    . timolol (TIMOPTIC) 0.5 % ophthalmic solution Place 1 drop into both eyes 2 (two) times daily.    . traMADol (ULTRAM) 50 MG tablet Take 50-100 mg by mouth every 6 (six) hours as needed for moderate pain.     Marland Kitchen TRIBENZOR 40-10-25 MG TABS Take 1 tablet by mouth every morning.      No current facility-administered medications for this visit.     LABS: Lab Results  Component Value Date   WBC 5.9 06/11/2016   HGB 8.7 (L) 06/11/2016   HCT 26.2 (L) 06/11/2016   MCV 85.6 06/11/2016   PLT 219 06/11/2016      Component Value Date/Time   NA 134 (L) 06/11/2016 1138   K 4.4 06/11/2016 1138   CL 97 (L) 08/18/2015 1230   CL 102 04/25/2013 1250   CO2 27 06/11/2016 1138   GLUCOSE 90 06/11/2016 1138   GLUCOSE 120 (H) 04/25/2013 1250   BUN 15.0 06/11/2016 1138   CREATININE 0.7 06/11/2016 1138   CALCIUM 10.2 06/11/2016 1138   GFRNONAA >60 08/18/2015 1230   GFRAA >60 08/18/2015 1230   No results found for: INR, PROTIME No results found for: PTT  SOCIAL HISTORY: Social History   Social History  . Marital status: Widowed    Spouse name: N/A  . Number of children: 2  . Years of education: HS   Occupational History  . retired    Social History Main Topics  . Smoking status: Never Smoker  . Smokeless tobacco: Never Used  . Alcohol use No  . Drug use: No  . Sexual activity: Not on file   Other Topics Concern  . Not on file   Social History Narrative   Patient lives at home alone.   Patient drinks about 3 cups of caffeine daily.   Patient is right handed.     FAMILY HISTORY: No family history on file.  REVIEW OF SYSTEMS: Reviewed with the patient as per history of present illness. Psych: She  denies dental phobia.  DENTAL HISTORY: CHIEF COMPLAINT: The patient was referred by Dr. Jana Hakim for evaluation of the mandibular left gum lesion.   HPI: Alejandra Grant is a 82 year old female with history of right breast cancer with osseous metastases. Patient is being followed by Dr. Jana Hakim. Patient currently on Arimidex daily therapy. Patient has a history of 7 IV doses of Xgeva for the osseous metastases. Patient is being referred for evaluation of  mandibular left gum lesion and to rule out possible medication-elated osteonecrosis of the jaw.  Patient gives a history of having "an ulcer on bone " in the area of the lower left first molar #19 on the buccal.  This has been present for approximately 2 weeks by patient report. Patient describes some discomfort primarily with eating. Patient indicates that the pain reaches an intensity of 3-4 out of 10 but is currently 0 out of 10 in intensity today. Patient indicates that the pain is primarily present when she is eating. Patient tries to avoid the area by chewing on the right side. Patient indicates that she has been applying the chlorhexidine rinse with a Q-tip 4 times daily with minimal relief.  The patient indicates that she has been evaluated by her primary dentist, Dr. Demetrius Revel, for this problem. Patient was recently seen on 05/26/2016 for an exam and cleaning with Dr. Rennie Natter office.  Patient also has been complaining of some discomfort in the area of tooth #18 that experiences a sharp pain for seconds only over the past several months.  Upon further discussion with the patient, it was determined that this discomfort was felt to be related to her occlusion.  Patient has been using a hard acrylic splint/mouth guard at bedtime for possible clenching/bruxism habits.  Patient denies having dental phobia.   DENTAL EXAMINATION: GENERAL: The patient is a well-developed, well-nourished female that walks with the aid of a cane and is  considered legally blind. HEAD AND NECK: There is no palpable lymphadenopathy. Patient denies TMJ symptoms. INTRAORAL EXAM: Patient has normal saliva. The patient has bilateral mandibular lingual tori. There is a 3 x 3 mm soft tissue lesion on the buccal aspect of tooth #19 and approximately 4 mm from the gingival border. There is exposed bone noted with this lesion. DENTITION: Patient is missing tooth numbers 1, 2, 4, 15, 16, 17, and 32. PERIODONTAL: Patient has chronic periodontitis with minimal plaque accumulations, gingival recession, and tooth mobility noted in the area of tooth numbers 18, 19, 20, 21 as per dental charting form. There appears to be some increased periodontal ligament space associated with tooth numbers 18, 19, 20, 21 that may be related to traumatic occlusion or possibly related to medication related osteonecrosis of the jaw. DENTAL CARIES/SUBOPTIMAL RESTORATIONS: There are no obvious dental caries noted. Patient has a mesial overhang associated with the amalgam on tooth #18.   ENDODONTIC: The patient currently denies acute pulpitis symptoms. Patient has multiple teeth with previous root canal therapies. CROWN AND BRIDGE: Patient has multiple crown restorations noted. PROSTHODONTIC: No history of partial dentures. Patient does have a history of hard occlusal splint therapy. OCCLUSION:  Patient may be experiencing some traumatic occlusion in the area of tooth numbers 18 through 20.  Patient does have a history of previous occlusal splint therapy with Dr. Osa Craver.  RADIOGRAPHIC INTERPRETATION: 2 periapical radiographs were taken in the area of tooth numbers 18 and 20. There are no obvious periapical radiolucencies. There is incipient to moderate bone loss. There is an increased periodontal ligament space associated with tooth numbers 18, 19, 20, 21. There appears to be a mesial amalgam overhang on tooth #18. The crown margin on tooth #20 appears to be less than ideal. Tooth  numbers 20 and 21 have had previous root canal therapy.   ASSESSMENTS: 1. History of right breast cancer 2. Osseous metastases 3. History of IV Xgeva therapy and previous use of oral Fosamax therapy 4. Risk for medication related osteonecrosis  of the jaw 5. Soft tissue lesion on the buccal aspect of tooth #19 and measures 3 x 3 mm and has exposed bone. 6. Chronic periodontitis with bone loss 7. Gingival recession 8. Accretions - minimal 9. Tooth mobility as per dental charting form 10. Possible traumatic occlusion associated with tooth numbers 18 through 20 11. 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 12. History of previous occlusal splint therapy for bruxism/clenching  PLAN/RECOMMENDATIONS: 1. I discussed the risks, benefits, and complications of various treatment options with the patient in relationship to her medical and dental conditions and possible medication related osteonecrosis of the jaw due to previous oral Fosamax and IV Xgeva therapies. We discussed various treatment options to include no treatment, continued use of chlorhexidine rinses to the lower left quadrant 4 times daily, consideration for the use of a Orajel product to numb the area prior to eating, consideration for use of viscous lidocaine to the affected area prior to meals although this may put the patient at risk for aspiration of food, consideration for occlusal adjustments by her primary dentist, continued use of occlusal splint therapy as indicated, and further follow-up with her primary dentist and dental medicine as indicated for reevaluation of the exposed bone. Patient currently indicates that she will follow-up with her primary dentist for reevaluation of the exposed bone in one to 2 months. Patient is to call if she wishes to follow-up with dental  medicine. Patient is already scheduled for further periodontal therapy in January 2018.   2. Discussion of findings with medical team and coordination of future medical and dental care as needed.  I spent in excess of  90 minutes during the conduct of this consultation and >50% of this time involved direct face-to-face encounter for counseling and/or coordination of the patient's care.    Lenn Cal, DDS

## 2016-06-18 ENCOUNTER — Telehealth: Payer: Self-pay | Admitting: Oncology

## 2016-06-18 ENCOUNTER — Other Ambulatory Visit (HOSPITAL_BASED_OUTPATIENT_CLINIC_OR_DEPARTMENT_OTHER): Payer: Medicare Other

## 2016-06-18 ENCOUNTER — Ambulatory Visit (HOSPITAL_BASED_OUTPATIENT_CLINIC_OR_DEPARTMENT_OTHER): Payer: Medicare Other | Admitting: Oncology

## 2016-06-18 ENCOUNTER — Ambulatory Visit (HOSPITAL_BASED_OUTPATIENT_CLINIC_OR_DEPARTMENT_OTHER): Payer: Medicare Other

## 2016-06-18 VITALS — BP 147/57 | HR 57 | Temp 98.3°F | Resp 17 | Wt 104.4 lb

## 2016-06-18 DIAGNOSIS — C50411 Malignant neoplasm of upper-outer quadrant of right female breast: Secondary | ICD-10-CM

## 2016-06-18 DIAGNOSIS — C50911 Malignant neoplasm of unspecified site of right female breast: Secondary | ICD-10-CM

## 2016-06-18 DIAGNOSIS — M810 Age-related osteoporosis without current pathological fracture: Secondary | ICD-10-CM

## 2016-06-18 DIAGNOSIS — K1379 Other lesions of oral mucosa: Secondary | ICD-10-CM

## 2016-06-18 DIAGNOSIS — I700 Atherosclerosis of aorta: Secondary | ICD-10-CM

## 2016-06-18 DIAGNOSIS — D519 Vitamin B12 deficiency anemia, unspecified: Secondary | ICD-10-CM

## 2016-06-18 DIAGNOSIS — C7951 Secondary malignant neoplasm of bone: Secondary | ICD-10-CM | POA: Diagnosis not present

## 2016-06-18 DIAGNOSIS — E538 Deficiency of other specified B group vitamins: Secondary | ICD-10-CM

## 2016-06-18 DIAGNOSIS — N393 Stress incontinence (female) (male): Secondary | ICD-10-CM

## 2016-06-18 DIAGNOSIS — J984 Other disorders of lung: Secondary | ICD-10-CM | POA: Diagnosis not present

## 2016-06-18 LAB — COMPREHENSIVE METABOLIC PANEL
ALT: 13 U/L (ref 0–55)
ANION GAP: 6 meq/L (ref 3–11)
AST: 27 U/L (ref 5–34)
Albumin: 3.3 g/dL — ABNORMAL LOW (ref 3.5–5.0)
Alkaline Phosphatase: 81 U/L (ref 40–150)
BUN: 21.1 mg/dL (ref 7.0–26.0)
CALCIUM: 10.4 mg/dL (ref 8.4–10.4)
CHLORIDE: 100 meq/L (ref 98–109)
CO2: 28 meq/L (ref 22–29)
Creatinine: 0.7 mg/dL (ref 0.6–1.1)
EGFR: 71 mL/min/{1.73_m2} — ABNORMAL LOW (ref >90)
Glucose: 115 mg/dl (ref 70–140)
POTASSIUM: 4.6 meq/L (ref 3.5–5.1)
Sodium: 134 mEq/L — ABNORMAL LOW (ref 136–145)
Total Bilirubin: <0.3 mg/dL (ref 0.20–1.20)
Total Protein: 6.7 g/dL (ref 6.4–8.3)

## 2016-06-18 LAB — CBC & DIFF AND RETIC
BASO%: 0.2 % (ref 0.0–2.0)
BASOS ABS: 0 10*3/uL (ref 0.0–0.1)
EOS%: 1.9 % (ref 0.0–7.0)
Eosinophils Absolute: 0.1 10*3/uL (ref 0.0–0.5)
HEMATOCRIT: 26.5 % — AB (ref 34.8–46.6)
HGB: 8.8 g/dL — ABNORMAL LOW (ref 11.6–15.9)
Immature Retic Fract: 22.3 % — ABNORMAL HIGH (ref 1.60–10.00)
LYMPH#: 1.9 10*3/uL (ref 0.9–3.3)
LYMPH%: 33.2 % (ref 14.0–49.7)
MCH: 28.6 pg (ref 25.1–34.0)
MCHC: 33.2 g/dL (ref 31.5–36.0)
MCV: 86 fL (ref 79.5–101.0)
MONO#: 0.6 10*3/uL (ref 0.1–0.9)
MONO%: 11 % (ref 0.0–14.0)
NEUT%: 53.7 % (ref 38.4–76.8)
NEUTROS ABS: 3.1 10*3/uL (ref 1.5–6.5)
Platelets: 201 10*3/uL (ref 145–400)
RBC: 3.08 10*6/uL — AB (ref 3.70–5.45)
RDW: 18.4 % — AB (ref 11.2–14.5)
RETIC %: 2.19 % — AB (ref 0.70–2.10)
Retic Ct Abs: 67.45 10*3/uL (ref 33.70–90.70)
WBC: 5.8 10*3/uL (ref 3.9–10.3)

## 2016-06-18 MED ORDER — CYANOCOBALAMIN 1000 MCG/ML IJ SOLN
1000.0000 ug | Freq: Once | INTRAMUSCULAR | Status: AC
  Administered 2016-06-18: 1000 ug via INTRAMUSCULAR

## 2016-06-18 NOTE — Progress Notes (Signed)
ID: Alejandra Grant OB: 1923/02/11  MR#: [id number]  CSN#:[id number]  PCP: Vidal Schwalbe, MD GYN:   SUFanny Skates OTHER MD: Kyung Rudd, Todd McDiarmid, Izora Gala, Rupinder Orene Desanctis  CHIEF COMPLAINT: Estrogen receptor positive breast cancer  CURRENT TREATMENT: anastrozole  BREAST CANCER HISTORY: From the prior summary note:  Alejandra Grant has a prior history of bilateral lumpectomies, bilateral rest irradiation, and prior treatment with tamoxifen (approximately 4 and half years) and an anastrozole (unknown duration). The data is being obtained from the archives.  More recently some new calcifications were noted in the right breast, and digital right breast mammography with magnification views confirmed a cluster in the posterior third of the breast spanning an area of 1.3 cm. Repeat within 6 months was suggested, and performed 04/02/2013. The patient's breasts are heterogeneously dense. The area of 1.3 cm of microcalcifications had increased in number, and spanned 1.6 cm and this study. Accordingly biopsy was obtained 04/18/2013, and this showed ([id number]) high-grade ductal carcinoma in situ, with a 2 mm area of invasive disease. The invasive tumor was estrogen receptor 100% positive, progesterone receptor negative, with an MIB-1 of 2% and no HER-2 amplification.  The patient's subsequent history is as detailed below  INTERVAL HISTORY: Alejandra Grant returns today for follow up of her stage IV breast cancer, accompanied by her friend Alejandra Grant. Since her last visit here she's been evaluated by Dr. 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 She also has some loose teeth but is of course avoiding extractions.  She is tolerating the anastrozole with no side effects that she is aware of and particularly no problems with hot flashes or vaginal dryness.  She obtains it at a good price.  Since her last visit here she was also found to have a low B12 level and will be starting B12 replacement today.  REVIEW OF SYSTEMS: Alejandra Grant feels "lousy". She has no energy, feels weak, and is tired all the time. She has throbbing discomfort in her mouth, which is intermittent. Of course she is legally blind and has hearing problems. Her feet swell. Her appetite is poor. She has stress urinary incontinence. Walking is difficult for her. She describes herself as depressed. A detailed review of systems today was otherwise stable  PAST MEDICAL HISTORY: Past Medical History:  Diagnosis Date  . Anemia   . Anxiety   . 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 Bone cancer ([place])   . Breast cancer ([place])   . Cancer (HCC)    breast, skin  . Depression   . Heart murmur   . Hypertension   . Paresthesia 08/21/2015  . Urinary incontinence     PAST SURGICAL HISTORY: Past Surgical History:  Procedure Laterality Date  . ABDOMINAL HYSTERECTOMY    . BACK SURGERY    . BREAST LUMPECTOMY Bilateral    left 1991,right side 1994  . EYE SURGERY Bilateral    cat  . PARTIAL MASTECTOMY WITH NEEDLE LOCALIZATION Right 05/21/2013   Procedure: PARTIAL MASTECTOMY WITH NEEDLE LOCALIZATION;  Surgeon: Adin Hector, MD;  Location: [facility];  Service: General;  Laterality: Right;  . TONSILLECTOMY      FAMILY HISTORY No family history on file. The patient's mother died giving birth to the patient, at age 16. The patient did not grow up with her father and has  very little information regarding him or his side of the family. She believes he died in his 65s in a state hospital. The patient had no brothers or sisters.  GYNECOLOGIC HISTORY:  The patient does not recall how old she was at menarche. She thinks she went through menopause in her 72s. She is GX P2 with first live birth at age 28. The patient is status post hysterectomy but does not know if her ovaries were  removed or not.  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 She uses the [employer] and other support services to get around, do her shopping, etc. Son Alejandra Grant lives in [place] and works in Scientist, research (life sciences) estate. Son Alejandra Grant lives in [place] and is in Economist. The patient has 3 grandchildren. She attends the first [employer] locally.    ADVANCED DIRECTIVES: In place. The patient's son Alejandra Grant is her healthcare part turning. At the 12/12/2015 visit advanced directives were explicitly discussed with the patient, both children present. There was agreement that in case of a terminal event the patient should not be resuscitated, undergo CPR, or have live support.   HEALTH MAINTENANCE: Social History  Substance Use Topics  . Smoking status: Never Smoker  . Smokeless tobacco: Never Used  . Alcohol use No     Colonoscopy:  PAP:  Bone density: October 2008, showing osteoporosis (left hip T score -2.6)  Lipid panel:  Allergies  Allergen Reactions  . Aspirin     Bleeding / rash    Current Outpatient Prescriptions  Medication Sig Dispense Refill  . anastrozole (ARIMIDEX) 1 MG tablet Take 1 tablet (1 mg total) by mouth daily. 90 tablet 3  . CALCIUM PO Take 1 tablet by mouth daily.    . carvedilol (COREG) 3.125 MG tablet 1 tablet 2 (two) times daily.    . chlorhexidine (PERIDEX) 0.12 % solution Use as directed 5 mLs in the mouth or throat 4 (four) times daily. Apply to ulceration /exposed bone 4 times daily as directed    . FLUoxetine (PROZAC) 20 MG capsule Take 20 mg by mouth every other day.     . hydrALAZINE (APRESOLINE) 25 MG tablet Take 50 mg by mouth 4 (four) times daily.     . mirtazapine (REMERON) 30 MG tablet Take 30 mg by mouth at bedtime.     . Multiple Vitamins-Minerals (ICAPS MV PO) Take by mouth 1 day or 1 dose.    . potassium chloride (KLOR-CON) 8 MEQ tablet Take 16 mEq by mouth 2 (two)  times daily.    . prednisoLONE acetate (PRED FORTE) 1 % ophthalmic suspension Place 1 drop into the right eye 2 (two) times daily.    Marland Kitchen rOPINIRole (REQUIP) 1 MG tablet Take 1 mg by mouth at bedtime.    . timolol (TIMOPTIC) 0.5 % ophthalmic solution Place 1 drop into both eyes 2 (two) times daily.    . traMADol (ULTRAM) 50 MG tablet Take 50-100 mg by mouth every 6 (six) hours as needed for moderate pain.     Marland Kitchen TRIBENZOR 40-10-25 MG TABS Take 1 tablet by mouth every morning.      No current facility-administered medications for this visit.     OBJECTIVE: Elderly white woman Examined in a wheelchair Vitals:   06/18/16 1240  BP: (!) 147/57  Pulse: (!) 57  Resp: 17  Temp: 98.3 F (36.8 C)     Body mass index  is 19.73 kg/m.    ECOG FS: 2  Sclerae unicteric, right keratopathyAs previously noted Oropharynx clear and moist No cervical or supraclavicular adenopathy Lungs no rales or rhonchi Heart regular rate and rhythm Abd soft, nontender, positive bowel sounds MSK kyphosis but no focal spinal tenderness, no upper extremity lymphedema Neuro: nonfocal, well oriented, appropriate affect, good engagement Breasts: Deferred   LAB RESULTS:  CMP     Component Value Date/Time   NA 134 (L) 06/11/2016 1138   K 4.4 06/11/2016 1138   CL 97 (L) 08/18/2015 1230   CL 102 04/25/2013 1250   CO2 27 06/11/2016 1138   GLUCOSE 90 06/11/2016 1138   GLUCOSE 120 (H) 04/25/2013 1250   BUN 15.0 06/11/2016 1138   CREATININE 0.7 06/11/2016 1138   CALCIUM 10.2 06/11/2016 1138   PROT 6.4 06/11/2016 1138   ALBUMIN 3.2 (L) 06/11/2016 1138   AST 26 06/11/2016 1138   ALT 12 06/11/2016 1138   ALKPHOS 76 06/11/2016 1138   BILITOT <0.30 06/11/2016 1138   GFRNONAA >60 08/18/2015 1230   GFRAA >60 08/18/2015 1230    I No results found for: SPEP  Lab Results  Component Value Date   WBC 5.8 06/18/2016   NEUTROABS 3.1 06/18/2016   HGB 8.8 (L) 06/18/2016   HCT 26.5 (L) 06/18/2016   MCV 86.0 06/18/2016     PLT 201 06/18/2016      Chemistry      Component Value Date/Time   NA 134 (L) 06/11/2016 1138   K 4.4 06/11/2016 1138   CL 97 (L) 08/18/2015 1230   CL 102 04/25/2013 1250   CO2 27 06/11/2016 1138   BUN 15.0 06/11/2016 1138   CREATININE 0.7 06/11/2016 1138      Component Value Date/Time   CALCIUM 10.2 06/11/2016 1138   ALKPHOS 76 06/11/2016 1138   AST 26 06/11/2016 1138   ALT 12 06/11/2016 1138   BILITOT <0.30 06/11/2016 1138      Vitamin B12 211 - 946 pg/mL 199  352   27.29 0.0 - 38.6 U/mL 443.6  401.7CM  344.4CM      No results found for: LABCA2  No components found for: LABCA125  No results for input(s): INR in the last 168 hours.  Urinalysis    Component Value Date/Time   COLORURINE YELLOW 05/11/2013 1317   APPEARANCEUR CLEAR 05/11/2013 1317   LABSPEC 1.005 06/04/2016 1229   PHURINE 7.5 06/04/2016 1229   PHURINE 7.5 05/11/2013 1317   GLUCOSEU Negative 06/04/2016 1229   HGBUR Negative 06/04/2016 1229   HGBUR NEGATIVE 05/11/2013 1317   BILIRUBINUR Negative 06/04/2016 1229   KETONESUR Negative 06/04/2016 1229   KETONESUR NEGATIVE 05/11/2013 1317   PROTEINUR Negative 06/04/2016 1229   PROTEINUR NEGATIVE 05/11/2013 1317   UROBILINOGEN 0.2 06/04/2016 1229   NITRITE Negative 06/04/2016 1229   NITRITE NEGATIVE 05/11/2013 1317   LEUKOCYTESUR Negative 06/04/2016 1229    STUDIES: No results found.   ASSESSMENT: 82 y.o. [place] woman  (1) status post remote lumpectomies to both breasts, with adjuvant radiation bilaterally, and prior history of tamoxifen and Arimidex  (2) Status post right breast biopsy 04/18/2013 for ductal carcinoma in situ, high-grade, with an area of microinvasion; the invasive cancer was estrogen receptor positive at 100%, progesterone receptor and HER-2 negative, with anMIB-1 of 2%.  (3)  status post right lumpectomy 05/21/2013 for a pT1b pnX, stage IA invasive ductal carcinoma, with negative margins(closest 1 mm from anterior  margin)  (4) genetics testing discussed--patient and family  prefer to forego genetic testing  (5) the patient had an 0.8 cm right lower lobe lung mass and a left posterior chest wall mass (likely hemangioma)   (a) repeat Chest CT 08/08/2015 shows the right lower lobe mass to be unchanged  (b) left posterior chest wall mass has decreased in size c/w 03/01/2014 (6.2 cm to 3.4 cm)  (c) no further chest CT follow-up needed   (6) right breast biopsy 07/02/2015 shows a clinically T1c NX invasive lobular breast cancer,  Grade 2, strongly estrogen and progesterone receptor positive, with an MIB-1 of 5% and no HER-2 amplification   (7) anastrozole started September 2016 but interrupted initially because of peripheral neuropathy symptoms. These were felt to be unrelated and the patient's definitively resumed anastrozole October 2016  (a) osteoporosis, on alendronate--discontinued February 2017  METASTATIC DISEASE: (8) PET scan 12/09/2015 shows innumerable bone lesions. 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 There is no general consensus on how to proceed in these situations and many physicians continue the bone treatments without apparent compromise of the bone problem resolution given the patient's discomfort and age we are going to stop the denosumab at least for several months. We can consider restarting if on when the problem with her mouth resolves.  She is tolerating the anastrozole well and her scans in May were stable. Clinically she appears stable. However her CA-22-29 has been rising. This requires further follow-up  and we will obtain repeat scans before she sees me again in October.  She has been found to have a low B12 level and I am starting B12 supplementation today. She will receive that here every 4 weeks.  If we document progression on the October scans we will consider fulvestrant and palbociclib period  Otherwise she knows to call for any problems that may develop before her next visit here.Marland Kitchen     Chauncey Cruel, MD   06/18/2016 12:55 PM

## 2016-06-18 NOTE — Patient Instructions (Signed)

## 2016-06-18 NOTE — Telephone Encounter (Signed)
appt made and avs printed °

## 2016-06-23 LAB — METHYLMALONIC ACID, SERUM: METHYLMALONIC ACID: 556 nmol/L — AB (ref 0–378)

## 2016-06-30 ENCOUNTER — Ambulatory Visit: Payer: Self-pay

## 2016-06-30 ENCOUNTER — Ambulatory Visit: Payer: Medicare Other | Admitting: Oncology

## 2016-06-30 ENCOUNTER — Other Ambulatory Visit: Payer: Medicare Other

## 2016-07-14 ENCOUNTER — Telehealth: Payer: Self-pay | Admitting: *Deleted

## 2016-07-14 NOTE — Telephone Encounter (Signed)
Pt called stating that she did not think her first B12 injection helping pt at all.  Stated she is still tired.  Denied shortness of breath, denied chest pain.  Pt wanted to know if she should come in on Friday 9/15 for her second injection.   Pt would like to know about her breast cancer and what is her progress.   Instructed pt to keep injection appt as scheduled.   Message will be routed to Dr. Jana Hakim and desk nurse.   Pt voiced understanding.

## 2016-07-16 ENCOUNTER — Ambulatory Visit: Payer: Medicare Other

## 2016-07-16 ENCOUNTER — Other Ambulatory Visit (HOSPITAL_BASED_OUTPATIENT_CLINIC_OR_DEPARTMENT_OTHER): Payer: Medicare Other

## 2016-07-16 ENCOUNTER — Ambulatory Visit (HOSPITAL_COMMUNITY)
Admission: RE | Admit: 2016-07-16 | Discharge: 2016-07-16 | Disposition: A | Discharge status: Home / Self Care | Payer: Medicare Other | Source: Ambulatory Visit | Attending: Oncology | Admitting: Oncology

## 2016-07-16 ENCOUNTER — Ambulatory Visit (HOSPITAL_BASED_OUTPATIENT_CLINIC_OR_DEPARTMENT_OTHER): Payer: Medicare Other

## 2016-07-16 ENCOUNTER — Other Ambulatory Visit: Payer: Self-pay | Admitting: *Deleted

## 2016-07-16 VITALS — BP 144/46 | HR 58 | Temp 98.0°F | Resp 18

## 2016-07-16 DIAGNOSIS — C7951 Secondary malignant neoplasm of bone: Secondary | ICD-10-CM

## 2016-07-16 DIAGNOSIS — D638 Anemia in other chronic diseases classified elsewhere: Secondary | ICD-10-CM

## 2016-07-16 DIAGNOSIS — E538 Deficiency of other specified B group vitamins: Secondary | ICD-10-CM | POA: Diagnosis present

## 2016-07-16 DIAGNOSIS — D649 Anemia, unspecified: Secondary | ICD-10-CM | POA: Insufficient documentation

## 2016-07-16 DIAGNOSIS — C50911 Malignant neoplasm of unspecified site of right female breast: Secondary | ICD-10-CM

## 2016-07-16 DIAGNOSIS — I700 Atherosclerosis of aorta: Secondary | ICD-10-CM

## 2016-07-16 DIAGNOSIS — C50411 Malignant neoplasm of upper-outer quadrant of right female breast: Secondary | ICD-10-CM

## 2016-07-16 LAB — COMPREHENSIVE METABOLIC PANEL
ALK PHOS: 81 U/L (ref 40–150)
ALT: 14 U/L (ref 0–55)
ANION GAP: 8 meq/L (ref 3–11)
AST: 22 U/L (ref 5–34)
Albumin: 3 g/dL — ABNORMAL LOW (ref 3.5–5.0)
BUN: 28.7 mg/dL — AB (ref 7.0–26.0)
CALCIUM: 9.7 mg/dL (ref 8.4–10.4)
CHLORIDE: 105 meq/L (ref 98–109)
CO2: 25 mEq/L (ref 22–29)
CREATININE: 0.8 mg/dL (ref 0.6–1.1)
EGFR: 69 mL/min/{1.73_m2} — ABNORMAL LOW (ref >90)
Glucose: 116 mg/dl (ref 70–140)
Potassium: 4 mEq/L (ref 3.5–5.1)
Sodium: 138 mEq/L (ref 136–145)
TOTAL PROTEIN: 6.4 g/dL (ref 6.4–8.3)

## 2016-07-16 LAB — CBC WITH DIFFERENTIAL/PLATELET
BASO%: 0.6 % (ref 0.0–2.0)
Basophils Absolute: 0 10*3/uL (ref 0.0–0.1)
EOS%: 1.7 % (ref 0.0–7.0)
Eosinophils Absolute: 0.1 10*3/uL (ref 0.0–0.5)
HEMATOCRIT: 23.6 % — AB (ref 34.8–46.6)
HEMOGLOBIN: 7.8 g/dL — AB (ref 11.6–15.9)
LYMPH#: 1.3 10*3/uL (ref 0.9–3.3)
LYMPH%: 27.6 % (ref 14.0–49.7)
MCH: 29.1 pg (ref 25.1–34.0)
MCHC: 33 g/dL (ref 31.5–36.0)
MCV: 88 fL (ref 79.5–101.0)
MONO#: 0.7 10*3/uL (ref 0.1–0.9)
MONO%: 13.8 % (ref 0.0–14.0)
NEUT%: 56.3 % (ref 38.4–76.8)
NEUTROS ABS: 2.7 10*3/uL (ref 1.5–6.5)
PLATELETS: 260 10*3/uL (ref 145–400)
RBC: 2.69 10*6/uL — ABNORMAL LOW (ref 3.70–5.45)
RDW: 19.2 % — AB (ref 11.2–14.5)
WBC: 4.8 10*3/uL (ref 3.9–10.3)

## 2016-07-16 LAB — ABO/RH: ABO/RH(D): A POS

## 2016-07-16 MED ORDER — CYANOCOBALAMIN 1000 MCG/ML IJ SOLN
1000.0000 ug | Freq: Once | INTRAMUSCULAR | Status: AC
  Administered 2016-07-16: 1000 ug via INTRAMUSCULAR

## 2016-07-17 ENCOUNTER — Ambulatory Visit: Payer: Medicare Other

## 2016-07-17 DIAGNOSIS — D638 Anemia in other chronic diseases classified elsewhere: Secondary | ICD-10-CM

## 2016-07-17 DIAGNOSIS — D649 Anemia, unspecified: Secondary | ICD-10-CM | POA: Diagnosis not present

## 2016-07-17 LAB — CANCER ANTIGEN 27.29: CA 27.29: 373.8 U/mL — ABNORMAL HIGH (ref 0.0–38.6)

## 2016-07-17 LAB — PREPARE RBC (CROSSMATCH)

## 2016-07-17 MED ORDER — SODIUM CHLORIDE 0.9 % IV SOLN
250.0000 mL | Freq: Once | INTRAVENOUS | Status: AC
  Administered 2016-07-17: 250 mL via INTRAVENOUS

## 2016-07-17 MED ORDER — ACETAMINOPHEN 325 MG PO TABS
650.0000 mg | ORAL_TABLET | Freq: Once | ORAL | Status: AC
  Administered 2016-07-17: 650 mg via ORAL

## 2016-07-17 MED ORDER — DIPHENHYDRAMINE HCL 25 MG PO CAPS
ORAL_CAPSULE | ORAL | Status: AC
  Filled 2016-07-17: qty 1

## 2016-07-17 MED ORDER — DIPHENHYDRAMINE HCL 25 MG PO CAPS
25.0000 mg | ORAL_CAPSULE | Freq: Once | ORAL | Status: AC
  Administered 2016-07-17: 25 mg via ORAL

## 2016-07-17 MED ORDER — ACETAMINOPHEN 325 MG PO TABS
ORAL_TABLET | ORAL | Status: AC
  Filled 2016-07-17: qty 2

## 2016-07-17 NOTE — Patient Instructions (Signed)

## 2016-07-18 LAB — TYPE AND SCREEN
ABO/RH(D): A POS
Antibody Screen: NEGATIVE
UNIT DIVISION: 0
Unit division: 0

## 2016-07-21 ENCOUNTER — Telehealth: Payer: Self-pay | Admitting: *Deleted

## 2016-07-21 NOTE — Telephone Encounter (Signed)
This RN returned call to pt per her concerns.  She states she is having increased issues with not sleeping well at night - " which then makes me drowsy all day "  She is " only doing what is needed and then with a push and a shove "  Noted new diagnosis in August 2017 of  low B12- now being supplemented by injection and recent blood transfusion for heme that decreased to 7.8.  Pt states she is seen by a psychiatrist who manages her antidepressants.  She states she has called her reqarding sleep issues but is concerned due to ongoing drowsiness and new diagnosis of B12 anemia.  Per call plan is for pt to come in for assessment of labs with visit by [facility]- if therapy per known breast cancer or anemia issue - can be addressed.  If needed this office can contact her psych MD if changes need to be made in current meds that are prescribe by them.  Alejandra Grant states agreement and appreciation for above plan.  Appointment given for tomorrow due to lateness of day at present.

## 2016-07-21 NOTE — Telephone Encounter (Signed)
Received call from pt stating that she feels real,real,bad even after having blood transfusion on Saturday.  She reports feeling tired, no energy.  Message routed to Dr Magrinat/Pod RN.

## 2016-07-22 ENCOUNTER — Other Ambulatory Visit: Payer: Self-pay | Admitting: *Deleted

## 2016-07-22 ENCOUNTER — Ambulatory Visit (HOSPITAL_BASED_OUTPATIENT_CLINIC_OR_DEPARTMENT_OTHER): Payer: Medicare Other | Admitting: Oncology

## 2016-07-22 ENCOUNTER — Other Ambulatory Visit: Payer: Self-pay | Admitting: Oncology

## 2016-07-22 ENCOUNTER — Other Ambulatory Visit (HOSPITAL_BASED_OUTPATIENT_CLINIC_OR_DEPARTMENT_OTHER): Payer: Medicare Other

## 2016-07-22 VITALS — BP 133/53 | HR 61 | Temp 98.4°F | Resp 17 | Ht 61.0 in | Wt 102.9 lb

## 2016-07-22 DIAGNOSIS — D519 Vitamin B12 deficiency anemia, unspecified: Secondary | ICD-10-CM

## 2016-07-22 DIAGNOSIS — C50411 Malignant neoplasm of upper-outer quadrant of right female breast: Secondary | ICD-10-CM

## 2016-07-22 DIAGNOSIS — E538 Deficiency of other specified B group vitamins: Secondary | ICD-10-CM

## 2016-07-22 DIAGNOSIS — R5383 Other fatigue: Secondary | ICD-10-CM | POA: Diagnosis not present

## 2016-07-22 DIAGNOSIS — D518 Other vitamin B12 deficiency anemias: Secondary | ICD-10-CM

## 2016-07-22 DIAGNOSIS — C7951 Secondary malignant neoplasm of bone: Secondary | ICD-10-CM | POA: Diagnosis not present

## 2016-07-22 DIAGNOSIS — G4700 Insomnia, unspecified: Secondary | ICD-10-CM | POA: Insufficient documentation

## 2016-07-22 LAB — COMPREHENSIVE METABOLIC PANEL
ALBUMIN: 3.1 g/dL — AB (ref 3.5–5.0)
ALK PHOS: 83 U/L (ref 40–150)
ALT: 13 U/L (ref 0–55)
AST: 24 U/L (ref 5–34)
Anion Gap: 10 mEq/L (ref 3–11)
BILIRUBIN TOTAL: 0.3 mg/dL (ref 0.20–1.20)
BUN: 22.2 mg/dL (ref 7.0–26.0)
CO2: 24 mEq/L (ref 22–29)
Calcium: 9.6 mg/dL (ref 8.4–10.4)
Chloride: 104 mEq/L (ref 98–109)
Creatinine: 0.8 mg/dL (ref 0.6–1.1)
EGFR: 67 mL/min/{1.73_m2} — ABNORMAL LOW (ref >90)
GLUCOSE: 136 mg/dL (ref 70–140)
Potassium: 3.9 mEq/L (ref 3.5–5.1)
SODIUM: 139 meq/L (ref 136–145)
TOTAL PROTEIN: 6.5 g/dL (ref 6.4–8.3)

## 2016-07-22 LAB — CBC WITH DIFFERENTIAL/PLATELET
BASO%: 0.4 % (ref 0.0–2.0)
BASOS ABS: 0 10*3/uL (ref 0.0–0.1)
EOS ABS: 0.1 10*3/uL (ref 0.0–0.5)
EOS%: 2.2 % (ref 0.0–7.0)
HCT: 33.6 % — ABNORMAL LOW (ref 34.8–46.6)
HEMOGLOBIN: 11.1 g/dL — AB (ref 11.6–15.9)
LYMPH%: 28.4 % (ref 14.0–49.7)
MCH: 28.8 pg (ref 25.1–34.0)
MCHC: 33 g/dL (ref 31.5–36.0)
MCV: 87 fL (ref 79.5–101.0)
MONO#: 0.8 10*3/uL (ref 0.1–0.9)
MONO%: 15.1 % — AB (ref 0.0–14.0)
NEUT%: 53.9 % (ref 38.4–76.8)
NEUTROS ABS: 2.8 10*3/uL (ref 1.5–6.5)
Platelets: 199 10*3/uL (ref 145–400)
RBC: 3.86 10*6/uL (ref 3.70–5.45)
RDW: 17.6 % — AB (ref 11.2–14.5)
WBC: 5.1 10*3/uL (ref 3.9–10.3)
lymph#: 1.5 10*3/uL (ref 0.9–3.3)

## 2016-07-22 NOTE — Progress Notes (Signed)
SYMPTOM MANAGEMENT CLINIC    Chief Complaint: Fatigue, weakness, and insomnia  HPI:  Alejandra Grant 81 y.o. female diagnosed with metastatic breast cancer and anemia due to B12 deficiency. The patient is seen in our office today for increasing fatigue, weakness, and insomnia. She has a CNA that helps in the home here with her today. She was transfused with 2 units of packed red blood cells this past weekend and feels as though the blood transfusion did not help her at all. She remains very tired and overall weak. She denies shortness of breath at rest and dyspnea on exertion. She denies dizziness. She also reports that she has not been sleeping very well despite taking Remeron 30 mg at bedtime as well as Requip 1 mg at bedtime. Her medications are managed by psychiatry. She reports that she was told to increase the Requip to 2 mg at bedtime, but she did not do this because she was afraid it would make her drowsy. She states that she sleeps 2-3 hours at night and then wakes up and is awake the rest of the night. She also reports that she gets up and down to the bathroom 3-4 times during the night. When asked about her sleeping pattern during the day, she states she does not lay down to take a nap, but she does listen to books on on the oh and tends to fall asleep sitting up. She is unsure how much she is sleeping during the day. She is not very active around the house. Her CNA states that she used to be more active in terms of walking and getting out of the house a little bit but she no longer does this. Denies chest pain, abdominal pain, nausea, vomiting.   No history exists.    Review of Systems  Constitutional: Positive for malaise/fatigue. Negative for chills, diaphoresis, fever and weight loss.  HENT: Negative.   Eyes:       Legally blind  Respiratory: Negative.   Cardiovascular: Positive for leg swelling. Negative for chest pain, palpitations and orthopnea.  Gastrointestinal: Negative.     Genitourinary: Positive for frequency. Negative for dysuria, flank pain, hematuria and urgency.       Frequency at night. Gets up 3-4 times at night.  Musculoskeletal: Negative.   Skin: Negative.   Neurological: Negative.  Negative for weakness.  Endo/Heme/Allergies: Negative.   Psychiatric/Behavioral: The patient has insomnia.     Past Medical History:  Diagnosis Date  . Anemia   . Anxiety   . 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 Bone cancer ([place])   . Breast cancer ([place])   . Cancer (HCC)    breast, skin  . Depression   . Heart murmur   . Hypertension   . Paresthesia 08/21/2015  . Urinary incontinence     Past Surgical History:  Procedure Laterality Date  . ABDOMINAL HYSTERECTOMY    . BACK SURGERY    . BREAST LUMPECTOMY Bilateral    left 1991,right side 1994  . EYE SURGERY Bilateral    cat  . PARTIAL MASTECTOMY WITH NEEDLE LOCALIZATION Right 05/21/2013   Procedure: PARTIAL MASTECTOMY WITH NEEDLE LOCALIZATION;  Surgeon: Adin Hector, MD;  Location: [facility];  Service: General;  Laterality: Right;  . TONSILLECTOMY      has Breast cancer of upper-outer quadrant of right female breast ([place]); Hematoma of chest wall; Rib fractures; Neuropathy ([place]); Paresthesia; Osseous metastasis ([place]); Gum lesion; and Dehydration on  her problem list.    is allergic to aspirin.    Medication List       Accurate as of 07/22/16  3:12 PM. Always use your most recent med list.          anastrozole 1 MG tablet Commonly known as:  ARIMIDEX Take 1 tablet (1 mg total) by mouth daily.   CALCIUM PO Take 1 tablet by mouth daily.   carvedilol 3.125 MG tablet Commonly known as:  COREG 1 tablet 2 (two) times daily.   chlorhexidine 0.12 % solution Commonly known as:  PERIDEX Use as directed 5 mLs in the mouth or throat 4 (four) times daily. Apply to ulceration /exposed bone 4 times daily as directed   FLUoxetine 20 MG capsule Commonly known as:  PROZAC Take 20 mg by  mouth every other day.   hydrALAZINE 25 MG tablet Commonly known as:  APRESOLINE Take 50 mg by mouth 4 (four) times daily.   ICAPS MV PO Take by mouth 1 day or 1 dose.   mirtazapine 30 MG tablet Commonly known as:  REMERON Take 30 mg by mouth at bedtime.   potassium chloride 8 MEQ tablet Commonly known as:  KLOR-CON Take 16 mEq by mouth 2 (two) times daily.   prednisoLONE acetate 1 % ophthalmic suspension Commonly known as:  PRED FORTE Place 1 drop into the right eye 2 (two) times daily.   rOPINIRole 1 MG tablet Commonly known as:  REQUIP Take 1 mg by mouth at bedtime.   timolol 0.5 % ophthalmic solution Commonly known as:  TIMOPTIC Place 1 drop into both eyes 2 (two) times daily.   traMADol 50 MG tablet Commonly known as:  ULTRAM Take 50-100 mg by mouth every 6 (six) hours as needed for moderate pain.   TRIBENZOR 40-10-25 MG Tabs Generic drug:  Olmesartan-Amlodipine-HCTZ Take 1 tablet by mouth every morning.        PHYSICAL EXAMINATION  Oncology Vitals 07/22/2016 07/17/2016  Height 155 cm -  Weight 46.675 kg -  Weight (lbs) 102 lbs 14 oz -  BMI (kg/m2) 19.44 kg/m2 -  Temp 98.4 98.6  Pulse 61 71  Resp 17 16  SpO2 98 99  BSA (m2) 1.42 m2 -   BP Readings from Last 2 Encounters:  07/22/16 (!) 133/53  07/17/16 (!) 147/68    Physical Exam  Constitutional: She is oriented to person, place, and time and well-developed, well-nourished, and in no distress.  HENT:  Head: Normocephalic and atraumatic.  Mouth/Throat: Oropharynx is clear and moist.  Eyes: Conjunctivae are normal. No scleral icterus.  Right cataract.  Neck: Neck supple. No tracheal deviation present.  Cardiovascular: Normal rate and regular rhythm.   Murmur heard. Pulmonary/Chest: Effort normal and breath sounds normal.  Abdominal: Soft. Bowel sounds are normal.  Musculoskeletal: She exhibits edema.  Lymphadenopathy:    She has no cervical adenopathy.  Neurological: She is alert and oriented  to person, place, and time.  Skin: Skin is warm and dry.  Psychiatric: Mood, affect and judgment normal.  Nursing note and vitals reviewed.   LABORATORY DATA:. Appointment on 07/22/2016  Component Date Value Ref Range Status  . WBC 07/22/2016 5.1  3.9 - 10.3 10e3/uL Final  . NEUT# 07/22/2016 2.8  1.5 - 6.5 10e3/uL Final  . HGB 07/22/2016 11.1* 11.6 - 15.9 g/dL Final  . HCT 07/22/2016 33.6* 34.8 - 46.6 % Final  . Platelets 07/22/2016 199  145 - 400 10e3/uL Final  . MCV 07/22/2016 87.0  79.5 - 101.0 fL Final  . MCH 07/22/2016 28.8  25.1 - 34.0 pg Final  . MCHC 07/22/2016 33.0  31.5 - 36.0 g/dL Final  . RBC 07/22/2016 3.86  3.70 - 5.45 10e6/uL Final  . RDW 07/22/2016 17.6* 11.2 - 14.5 % Final  . lymph# 07/22/2016 1.5  0.9 - 3.3 10e3/uL Final  . MONO# 07/22/2016 0.8  0.1 - 0.9 10e3/uL Final  . Eosinophils Absolute 07/22/2016 0.1  0.0 - 0.5 10e3/uL Final  . Basophils Absolute 07/22/2016 0.0  0.0 - 0.1 10e3/uL Final  . NEUT% 07/22/2016 53.9  38.4 - 76.8 % Final  . LYMPH% 07/22/2016 28.4  14.0 - 49.7 % Final  . MONO% 07/22/2016 15.1* 0.0 - 14.0 % Final  . EOS% 07/22/2016 2.2  0.0 - 7.0 % Final  . BASO% 07/22/2016 0.4  0.0 - 2.0 % Final  . Sodium 07/22/2016 139  136 - 145 mEq/L Final  . Potassium 07/22/2016 3.9  3.5 - 5.1 mEq/L Final  . Chloride 07/22/2016 104  98 - 109 mEq/L Final  . CO2 07/22/2016 24  22 - 29 mEq/L Final  . Glucose 07/22/2016 136  70 - 140 mg/dl Final  . BUN 07/22/2016 22.2  7.0 - 26.0 mg/dL Final  . Creatinine 07/22/2016 0.8  0.6 - 1.1 mg/dL Final  . Total Bilirubin 07/22/2016 0.30  0.20 - 1.20 mg/dL Final  . Alkaline Phosphatase 07/22/2016 83  40 - 150 U/L Final  . AST 07/22/2016 24  5 - 34 U/L Final  . ALT 07/22/2016 13  0 - 55 U/L Final  . Total Protein 07/22/2016 6.5  6.4 - 8.3 g/dL Final  . Albumin 07/22/2016 3.1* 3.5 - 5.0 g/dL Final  . Calcium 07/22/2016 9.6  8.4 - 10.4 mg/dL Final  . Anion Gap 07/22/2016 10  3 - 11 mEq/L Final  . EGFR 07/22/2016 67* >90  ml/min/1.73 m2 Final    RADIOGRAPHIC STUDIES: No results found.  ASSESSMENT/PLAN:    No problem-specific Assessment & Plan notes found for this encounter.  This is a 82 year old female with metastatic breast cancer currently on anastrozole. She also has a diagnosis of vitamin B12 deficiency requiring monthly B12 injections and intermittent blood transfusions. She received a blood transfusion this past weekend for hemoglobin of 7.8. She did not notice an increase in her energy level and improvement in her weakness following the transfusion and she was concerned that the transfusion had not worked. CBC was rechecked today which showed a hemoglobin of 11.1 and she was reassured that her anemia had in fact improved. We discussed the fact that she is minimally active and sleeping throughout the day may be contributing to the fact that she is not sleeping well at night. I have encouraged her to take her psychiatrist recommendation and modified the Requip dose as per her recommendations. I also recommended the patient follow-up with her psychiatrist for further recommendations to manage her insomnia.  The patient will keep her follow-up appointment as previously scheduled.  Patient stated understanding of all instructions; and was in agreement with this plan of care. The patient knows to call the clinic with any problems, questions or concerns.   Total time spent with patient was 30 minutes;  with greater than 50 percent of that time spent in face to face counseling regarding patient's symptoms,  and coordination of care and follow up.   Mikey Bussing, NP 07/22/2016

## 2016-07-22 NOTE — Telephone Encounter (Signed)
Chart reviewed.

## 2016-08-09 ENCOUNTER — Encounter (HOSPITAL_COMMUNITY): Payer: Self-pay

## 2016-08-09 ENCOUNTER — Ambulatory Visit (HOSPITAL_COMMUNITY)
Admission: RE | Admit: 2016-08-09 | Discharge: 2016-08-09 | Disposition: A | Discharge status: Home / Self Care | Payer: Medicare Other | Source: Ambulatory Visit | Attending: Oncology | Admitting: Oncology

## 2016-08-09 DIAGNOSIS — C50411 Malignant neoplasm of upper-outer quadrant of right female breast: Secondary | ICD-10-CM

## 2016-08-09 DIAGNOSIS — C50911 Malignant neoplasm of unspecified site of right female breast: Secondary | ICD-10-CM

## 2016-08-09 DIAGNOSIS — C7951 Secondary malignant neoplasm of bone: Secondary | ICD-10-CM

## 2016-08-09 DIAGNOSIS — C50912 Malignant neoplasm of unspecified site of left female breast: Secondary | ICD-10-CM | POA: Diagnosis not present

## 2016-08-09 DIAGNOSIS — C50919 Malignant neoplasm of unspecified site of unspecified female breast: Secondary | ICD-10-CM | POA: Diagnosis not present

## 2016-08-09 MED ORDER — FLUDEOXYGLUCOSE F - 18 (FDG) INJECTION: 21.0000 | Freq: Once | INTRAVENOUS | Status: DC | PRN

## 2016-08-09 MED ORDER — IOPAMIDOL (ISOVUE-300) INJECTION 61%
75.0000 mL | Freq: Once | INTRAVENOUS | Status: AC | PRN
  Administered 2016-08-09: 75 mL via INTRAVENOUS

## 2016-08-09 MED ORDER — TECHNETIUM TC 99M MEDRONATE IV KIT
21.0000 | PACK | Freq: Once | INTRAVENOUS | Status: AC | PRN
  Administered 2016-08-09: 21 via INTRAVENOUS

## 2016-08-12 ENCOUNTER — Other Ambulatory Visit (HOSPITAL_BASED_OUTPATIENT_CLINIC_OR_DEPARTMENT_OTHER): Payer: Medicare Other

## 2016-08-12 ENCOUNTER — Ambulatory Visit (HOSPITAL_BASED_OUTPATIENT_CLINIC_OR_DEPARTMENT_OTHER): Payer: Medicare Other | Admitting: Oncology

## 2016-08-12 ENCOUNTER — Other Ambulatory Visit: Payer: Self-pay | Admitting: *Deleted

## 2016-08-12 VITALS — BP 169/60 | HR 69 | Temp 98.2°F | Resp 18 | Ht 61.0 in | Wt 102.9 lb

## 2016-08-12 DIAGNOSIS — Z170 Estrogen receptor positive status [ER+]: Secondary | ICD-10-CM

## 2016-08-12 DIAGNOSIS — C787 Secondary malignant neoplasm of liver and intrahepatic bile duct: Secondary | ICD-10-CM | POA: Diagnosis not present

## 2016-08-12 DIAGNOSIS — C50411 Malignant neoplasm of upper-outer quadrant of right female breast: Secondary | ICD-10-CM

## 2016-08-12 DIAGNOSIS — M810 Age-related osteoporosis without current pathological fracture: Secondary | ICD-10-CM | POA: Diagnosis not present

## 2016-08-12 DIAGNOSIS — C7951 Secondary malignant neoplasm of bone: Secondary | ICD-10-CM | POA: Diagnosis not present

## 2016-08-12 DIAGNOSIS — E538 Deficiency of other specified B group vitamins: Secondary | ICD-10-CM | POA: Diagnosis not present

## 2016-08-12 DIAGNOSIS — C50911 Malignant neoplasm of unspecified site of right female breast: Secondary | ICD-10-CM | POA: Diagnosis not present

## 2016-08-12 DIAGNOSIS — D638 Anemia in other chronic diseases classified elsewhere: Secondary | ICD-10-CM

## 2016-08-12 DIAGNOSIS — J984 Other disorders of lung: Secondary | ICD-10-CM

## 2016-08-12 DIAGNOSIS — I700 Atherosclerosis of aorta: Secondary | ICD-10-CM

## 2016-08-12 LAB — CBC & DIFF AND RETIC
BASO%: 0.2 % (ref 0.0–2.0)
Basophils Absolute: 0 10*3/uL (ref 0.0–0.1)
EOS ABS: 0.1 10*3/uL (ref 0.0–0.5)
EOS%: 1.2 % (ref 0.0–7.0)
HCT: 29.9 % — ABNORMAL LOW (ref 34.8–46.6)
HEMOGLOBIN: 9.9 g/dL — AB (ref 11.6–15.9)
IMMATURE RETIC FRACT: 16 % — AB (ref 1.60–10.00)
LYMPH#: 2 10*3/uL (ref 0.9–3.3)
LYMPH%: 29.6 % (ref 14.0–49.7)
MCH: 28.7 pg (ref 25.1–34.0)
MCHC: 33.1 g/dL (ref 31.5–36.0)
MCV: 86.7 fL (ref 79.5–101.0)
MONO#: 1 10*3/uL — AB (ref 0.1–0.9)
MONO%: 14.7 % — ABNORMAL HIGH (ref 0.0–14.0)
NEUT#: 3.6 10*3/uL (ref 1.5–6.5)
NEUT%: 54.3 % (ref 38.4–76.8)
PLATELETS: 185 10*3/uL (ref 145–400)
RBC: 3.45 10*6/uL — AB (ref 3.70–5.45)
RDW: 18 % — ABNORMAL HIGH (ref 11.2–14.5)
RETIC CT ABS: 47.27 10*3/uL (ref 33.70–90.70)
Retic %: 1.37 % (ref 0.70–2.10)
WBC: 6.6 10*3/uL (ref 3.9–10.3)

## 2016-08-12 LAB — COMPREHENSIVE METABOLIC PANEL
ALT: 14 U/L (ref 0–55)
ANION GAP: 8 meq/L (ref 3–11)
AST: 28 U/L (ref 5–34)
Albumin: 3 g/dL — ABNORMAL LOW (ref 3.5–5.0)
Alkaline Phosphatase: 93 U/L (ref 40–150)
BUN: 21.7 mg/dL (ref 7.0–26.0)
CALCIUM: 10 mg/dL (ref 8.4–10.4)
CHLORIDE: 104 meq/L (ref 98–109)
CO2: 28 meq/L (ref 22–29)
Creatinine: 0.7 mg/dL (ref 0.6–1.1)
EGFR: 72 mL/min/{1.73_m2} — AB (ref >90)
Glucose: 92 mg/dl (ref 70–140)
POTASSIUM: 4.4 meq/L (ref 3.5–5.1)
Sodium: 139 mEq/L (ref 136–145)
Total Bilirubin: 0.29 mg/dL (ref 0.20–1.20)
Total Protein: 6.5 g/dL (ref 6.4–8.3)

## 2016-08-12 LAB — FERRITIN: FERRITIN: 520 ng/mL — AB (ref 9–269)

## 2016-08-12 MED ORDER — CYANOCOBALAMIN 1000 MCG/ML IJ SOLN
INTRAMUSCULAR | Status: AC
  Filled 2016-08-12: qty 1

## 2016-08-12 MED ORDER — CYANOCOBALAMIN 1000 MCG/ML IJ SOLN
1000.0000 ug | Freq: Once | INTRAMUSCULAR | Status: AC
Start: 2016-08-12 — End: 2016-08-12
  Administered 2016-08-12: 1000 ug via INTRAMUSCULAR

## 2016-08-12 NOTE — Progress Notes (Signed)
ID: Alejandra Grant OB: 05/08/1923  MR#: [id number]  CSN#:[id number]  PCP: Vidal Schwalbe, MD GYN:   SU: Fanny Skates OTHER MD: Kyung Rudd, Todd McDiarmid, Izora Gala, Rupinder Orene Desanctis  CHIEF COMPLAINT: Estrogen receptor positive breast cancer  CURRENT TREATMENT: Comfort/palliative care under the care of hospice  BREAST CANCER HISTORY: From the prior summary note:  Alejandra Grant has a prior history of bilateral lumpectomies, bilateral rest irradiation, and prior treatment with tamoxifen (approximately 4 and half years) and an anastrozole (unknown duration). The data is being obtained from the archives.  More recently some new calcifications were noted in the right breast, and digital right breast mammography with magnification views confirmed a cluster in the posterior third of the breast spanning an area of 1.3 cm. Repeat within 6 months was suggested, and performed 04/02/2013. The patient's breasts are heterogeneously dense. The area of 1.3 cm of microcalcifications had increased in number, and spanned 1.6 cm and this study. Accordingly biopsy was obtained 04/18/2013, and this showed ([id number]) high-grade ductal carcinoma in situ, with a 2 mm area of invasive disease. The invasive tumor was estrogen receptor 100% positive, progesterone receptor negative, with an MIB-1 of 2% and no HER-2 amplification.  The patient's subsequent history is as detailed below  INTERVAL HISTORY: Alejandra Grant returns today for follow up of her metastatic breast cancer, accompanied by her sons Alejandra Grant and Alejandra Grant. She continues on anastrozole, with fair tolerance.  However we have just restage her with a CT scan of the chest and a bone scan. The bone scan suggests disease progression a in addition to what likely represents osteonecrosis of the jaw or significant mouth inflammation. More importantly the chest CT now shows evidence of i cancer spread to the liver   REVIEW OF SYSTEMS: Alejandra Grant is not feeling  well--"lousy" is her word. She is very tired and weak, with no energy. We are giving her some B12, which isn't making any difference. She received a blood transfusion which also made no difference. She has pain in her jaw, but also the left shoulder and left arm. She sleeps poorly. Of course she has major problems with her vision which are chronic. She feels depressed. A detailed review of systems today was otherwise stable  PAST MEDICAL HISTORY: Past Medical History:  Diagnosis Date  . Anemia   . Anxiety   . 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 Bone cancer ([place])   . Breast cancer ([place])   . Cancer ([facility])    breast, skin  . Depression   . Heart murmur   . Hypertension   . Paresthesia 08/21/2015  . Urinary incontinence     PAST SURGICAL HISTORY: Past Surgical History:  Procedure Laterality Date  . ABDOMINAL HYSTERECTOMY    . BACK SURGERY    . BREAST LUMPECTOMY Bilateral    left 1991,right side 1994  . EYE SURGERY Bilateral    cat  . PARTIAL MASTECTOMY WITH NEEDLE LOCALIZATION Right 05/21/2013   Procedure: PARTIAL MASTECTOMY WITH NEEDLE LOCALIZATION;  Surgeon: Adin Hector, MD;  Location: [facility];  Service: General;  Laterality: Right;  . TONSILLECTOMY      FAMILY HISTORY No family history on file. The patient's mother died giving birth to the patient, at age 107. The patient did not grow up with her father and has very little information regarding him or his side of the family. She believes he died in his 18s in a state hospital. The patient  had no brothers or sisters.  GYNECOLOGIC HISTORY:  The patient does not recall how old she was at menarche. She thinks she went through menopause in her 51s. She is GX P2 with first live birth at age 36. The patient is status post hysterectomy but does not know if her ovaries were removed or not.  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 She uses the [employer]  and other support services to get around, do her shopping, etc. Son Alejandra Grant lives in [place] and works in Scientist, research (life sciences) estate. Son Alejandra Grant lives in [place] and is in Economist. The patient has 3 grandchildren. She attends the first [employer] locally.    ADVANCED DIRECTIVES: In place. The patient's son Alejandra Grant is her healthcare part turning. At the 12/12/2015 visit advanced directives were explicitly discussed with the patient, both children present. There was agreement that in case of a terminal event the patient should not be resuscitated, undergo CPR, or have live support.   HEALTH MAINTENANCE: Social History  Substance Use Topics  . Smoking status: Never Smoker  . Smokeless tobacco: Never Used  . Alcohol use No     Colonoscopy:  PAP:  Bone density: October 2008, showing osteoporosis (left hip T score -2.6)  Lipid panel:  Allergies  Allergen Reactions  . Aspirin     Bleeding / rash    Current Outpatient Prescriptions  Medication Sig Dispense Refill  . carvedilol (COREG) 3.125 MG tablet 1 tablet 2 (two) times daily.    . chlorhexidine (PERIDEX) 0.12 % solution Use as directed 5 mLs in the mouth or throat 4 (four) times daily. Apply to ulceration /exposed bone 4 times daily as directed    . hydrALAZINE (APRESOLINE) 25 MG tablet Take 50 mg by mouth 4 (four) times daily.     . mirtazapine (REMERON) 30 MG tablet Take 30 mg by mouth at bedtime.     . Multiple Vitamins-Minerals (ICAPS MV PO) Take by mouth 1 day or 1 dose.    . potassium chloride (KLOR-CON) 8 MEQ tablet Take 16 mEq by mouth 2 (two) times daily.    . prednisoLONE acetate (PRED FORTE) 1 % ophthalmic suspension Place 1 drop into the right eye 2 (two) times daily.    Marland Kitchen rOPINIRole (REQUIP) 1 MG tablet Take 1 mg by mouth at bedtime.    . timolol (TIMOPTIC) 0.5 % ophthalmic solution Place 1 drop into both eyes 2 (two) times daily.    . traMADol (ULTRAM) 50 MG tablet Take 1-2 tablets (50-100 mg  total) by mouth every 6 (six) hours as needed for moderate pain. 60 tablet 0  . TRIBENZOR 40-10-25 MG TABS Take 1 tablet by mouth every morning.      No current facility-administered medications for this visit.     OBJECTIVE: Elderly white woman Examined in a wheelchair Vitals:   08/12/16 1415  BP: (!) 169/60  Pulse: 69  Resp: 18  Temp: 98.2 F (36.8 C)     Body mass index is 19.44 kg/m.    ECOG FS: 2  Sclerae unicteric, right keratopathy as previously noted Oropharynx clear, slightly dry No cervical or supraclavicular adenopathy Lungs no rales or rhonchi Heart regular rate and rhythm Abd soft, nontender, positive bowel sounds MSK kyphosis but no focal spinal tenderness Neuro: nonfocal, well oriented, appropriate affect Breasts: Deferred   LAB RESULTS:  CMP     Component Value Date/Time  NA 139 08/12/2016 1352   K 4.4 08/12/2016 1352   CL 97 (L) 08/18/2015 1230   CL 102 04/25/2013 1250   CO2 28 08/12/2016 1352   GLUCOSE 92 08/12/2016 1352   GLUCOSE 120 (H) 04/25/2013 1250   BUN 21.7 08/12/2016 1352   CREATININE 0.7 08/12/2016 1352   CALCIUM 10.0 08/12/2016 1352   PROT 6.5 08/12/2016 1352   ALBUMIN 3.0 (L) 08/12/2016 1352   AST 28 08/12/2016 1352   ALT 14 08/12/2016 1352   ALKPHOS 93 08/12/2016 1352   BILITOT 0.29 08/12/2016 1352   GFRNONAA >60 08/18/2015 1230   GFRAA >60 08/18/2015 1230    I No results found for: SPEP  Lab Results  Component Value Date   WBC 5.1 07/22/2016   NEUTROABS 2.8 07/22/2016   HGB 11.1 (L) 07/22/2016   HCT 29.2 (L) 08/12/2016   MCV 87.0 07/22/2016   PLT 199 07/22/2016      Chemistry      Component Value Date/Time   NA 139 08/12/2016 1352   K 4.4 08/12/2016 1352   CL 97 (L) 08/18/2015 1230   CL 102 04/25/2013 1250   CO2 28 08/12/2016 1352   BUN 21.7 08/12/2016 1352   CREATININE 0.7 08/12/2016 1352      Component Value Date/Time   CALCIUM 10.0 08/12/2016 1352   ALKPHOS 93 08/12/2016 1352   AST 28 08/12/2016 1352     ALT 14 08/12/2016 1352   BILITOT 0.29 08/12/2016 1352      Vitamin B12 211 - 946 pg/mL 199  352   27.29 0.0 - 38.6 U/mL 443.6  401.7CM  344.4CM      No results found for: LABCA2  No components found for: LABCA125  No results for input(s): INR in the last 168 hours.  Urinalysis    Component Value Date/Time   COLORURINE YELLOW 05/11/2013 1317   APPEARANCEUR CLEAR 05/11/2013 1317   LABSPEC 1.005 06/04/2016 1229   PHURINE 7.5 06/04/2016 1229   PHURINE 7.5 05/11/2013 1317   GLUCOSEU Negative 06/04/2016 1229   HGBUR Negative 06/04/2016 1229   HGBUR NEGATIVE 05/11/2013 1317   BILIRUBINUR Negative 06/04/2016 1229   KETONESUR Negative 06/04/2016 1229   KETONESUR NEGATIVE 05/11/2013 1317   PROTEINUR Negative 06/04/2016 1229   PROTEINUR NEGATIVE 05/11/2013 1317   UROBILINOGEN 0.2 06/04/2016 1229   NITRITE Negative 06/04/2016 1229   NITRITE NEGATIVE 05/11/2013 1317   LEUKOCYTESUR Negative 06/04/2016 1229    STUDIES: Ct Chest W Contrast  Result Date: 08/09/2016 CLINICAL DATA:  Bilateral breast cancer. EXAM: CT CHEST WITH CONTRAST TECHNIQUE: Multidetector CT imaging of the chest was performed during intravenous contrast administration. CONTRAST:  53m ISOVUE-300 IOPAMIDOL (ISOVUE-300) INJECTION 61% COMPARISON:  03/18/2016 FINDINGS: Cardiovascular: The heart is enlarged. Coronary artery calcification is noted. Atherosclerotic calcification is noted in the wall of the thoracic aorta. Pulmonary arterial enlargement suggests pulmonary arterial hypertension. Mediastinum/Nodes: No mediastinal lymphadenopathy. There is no hilar lymphadenopathy. The esophagus has normal imaging features. There is no axillary lymphadenopathy. Lungs/Pleura: Emphysema noted bilaterally with associated bronchial wall thickening. Subpleural interstitial disease in the right lung is stable and likely related to prior radiation. 8 mm right lower lobe pulmonary nodule seen previously is stable at 8 mm on 64 series 5. 4  mm left lower lobe nodule (image 40 series 5) is stable in the interval. No focal airspace consolidation. No pulmonary edema. Tiny right pleural effusion noted. Upper Abdomen: 14 mm hypo attenuating lesion in the central liver (image 122 series 2) is new  in the interval. 8 mm low-density lesion anterior right liver (image 124) is also new in the interval. 8 mm hypo attenuating lesion lateral segment left liver is unchanged and likely represents a cyst. Small upper pole cyst right kidney unchanged. Musculoskeletal: Widespread bony metastatic involvement again noted. The left subpectoral irregular soft tissue attenuation seen on the previous study is unchanged. IMPRESSION: 1. Interval development of hypo attenuating lesions in the liver, concerning for development of hepatic metastases. Dedicated abdominal pelvic CT with contrast material may prove helpful to further evaluate. 2. Stable bilateral lower lobe pulmonary nodules. 3. Stable appearance of the irregular subpectoral soft tissue identified previously in the left anterior chest wall. Electronically Signed   By: Misty Stanley M.D.   On: 08/09/2016 12:07   Nm Bone Scan Whole Body  Result Date: 08/09/2016 CLINICAL DATA:  Recurrent breast cancer with metastasis. According to the patient, bone ulcers on recent dental examination. EXAM: NUCLEAR MEDICINE WHOLE BODY BONE SCAN TECHNIQUE: Whole body anterior and posterior images were obtained approximately 3 hours after intravenous injection of radiopharmaceutical. RADIOPHARMACEUTICALS:  21 mCi Technetium-22mMDP IV COMPARISON:  03/18/2016 and PET CT 12/10/2015 FINDINGS: Markedly increased uptake throughout the entire mandible compared to the previous examination. Again noted are small foci of increased uptake in the right calvarium. Cannot exclude some progression in the right calvarium. Expected uptake in the renal collecting system. Areas of increased uptake in the ribs are less conspicuous from the previous  examination. Again noted are degenerative changes in left shoulder. Areas of presumed degenerative disease in the left wrist. Slightly increased uptake in the mid thoracic spine which is less conspicuous than the previous examination. Again noted is asymmetric uptake in the left sacrum that corresponds with the previous PET-CT findings. IMPRESSION: Markedly increased uptake throughout the mandible. Recommend correlation with recent dental visit. Diffuse distribution of the mandibular activity is suggestive for an inflammatory or infectious etiology. Again noted is abnormal uptake involving the right calvarium, ribs, spine and pelvis. These findings are compatible with areas of metastatic disease. Cannot exclude progression in the right calvarium. Uptake in the ribs and spine are less conspicuous than the previous examination. Electronically Signed   By: AMarkus DaftM.D.   On: 08/09/2016 14:20     ASSESSMENT: 82y.o. Spry woman  (1) status post remote lumpectomies to both breasts, with adjuvant radiation bilaterally, and prior history of adjuvant tamoxifen and anastrozole  (2) Status post right breast biopsy 04/18/2013 for ductal carcinoma in situ, high-grade, with an area of microinvasion; the invasive cancer was estrogen receptor positive at 100%, progesterone receptor and HER-2 negative, with anMIB-1 of 2%.  (3)  status post right lumpectomy 05/21/2013 for a pT1b pnX, stage IA invasive ductal carcinoma, with negative margins (closest 1 mm from anterior margin)  (4) genetics testing discussed--patient and family prefer to forego genetic testing  (5) the patient had an 0.8 cm right lower lobe lung mass and a left posterior chest wall mass (likely hemangioma) noted on CT scans May 2015  (a) repeat Chest CT 08/08/2015 shows the right lower lobe mass to be unchanged  (b) left posterior chest wall mass has decreased in size c/w 03/01/2014 (6.2 cm to 3.4 cm)  (c) no further chest CT follow-up  needed   (6) right breast biopsy 07/02/2015 shows a clinically T1c NX invasive lobular breast cancer,  Grade 2, strongly estrogen and progesterone receptor positive, with an MIB-1 of 5% and no HER-2 amplification   (7) anastrozole started September 2016  but interrupted initially because of peripheral neuropathy symptoms. These were felt to be unrelated and the patient's definitively resumed anastrozole October 2016  (a) osteoporosis, on alendronate--discontinued February 2017  (b) anastrozole discontinued October 2017 with evidence of disease progression   METASTATIC DISEASE: (8) PET scan 12/09/2015 shows innumerable bone lesions. 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 Since both her sons were present today we went back and discussed the general situation. They understands that stage IV breast cancer is not curable with our current knowledge base. The goal of treatment is control. The strategy of treatment is to do only the minimum necessary to control the growth of the tumor so that the patient can have as normal a life as possible. There is no survival advantage in treating aggressively if treating less aggressively results in tumor control. With this strategy stage IV breast cancer in many cases can function as a "chronic illness": something that cannot be quite gotten rid of but can be controlled for an indefinite period of time  There are always 3 questions to go over and so we reviewed those today. The first  question is do we treat or not. In some cases the patient's overall situation is so discouraging that palliative/comfort care alone is appropriate. If the decision is made to treat, then the next question is whether anti-estrogens or chemotherapy is more appropriate. Once that decision is made than the third question is: Which agent or combination of agents in particular should be used?   When we got to the first question, Jolisa is clear that she really does not want any further treatment simply to prolong her life. She just wants symptom control. She wants to be comfortable and anything we can do to make her more fit. We have previously discussed advanced directives and she and her children both are in agreement that in case of a terminal event she should not be resuscitated.  Accordingly we are stopping the anastrozole, and placing a hospice referral. I am concerned about Nyliah lives by herself. Even though she and her family have made arrangements for support services, at some point she is going to become more bedbound and will not be able to stay by herself. At that point she will essentially need 24/7 care. I urged the family to start thinking how to manage that at that point and whether she would be living in a skilled nursing facility in [place], or move in with one of her children.  They understand that the complaints will be an excellent option at the very end of life, but that stays they're currently are restricted to a few weeks at most.  We have to be careful with pain medications in Latiffany's case because of her age. What we would not want to do is cause confusion, falls, and other complications. She appears to tolerate timolol well and she will use that up to 4 times a day. She will let us know if she has any complications from it.  I have made her a return appointment with me  in approximately one month to make sure everything is in order. She knows to call for any problems that may  develop before that visit.    Chauncey Cruel, MD   08/14/2016 9:42 AM

## 2016-08-13 ENCOUNTER — Ambulatory Visit: Payer: Self-pay

## 2016-08-13 ENCOUNTER — Other Ambulatory Visit: Payer: Self-pay

## 2016-08-13 ENCOUNTER — Telehealth: Payer: Self-pay | Admitting: *Deleted

## 2016-08-13 ENCOUNTER — Other Ambulatory Visit: Payer: Self-pay | Admitting: *Deleted

## 2016-08-13 LAB — FOLATE RBC
FOLATE, HEMOLYSATE: 351.9 ng/mL
Folate, RBC: 1205 ng/mL (ref >498)
Hematocrit: 29.2 % — ABNORMAL LOW (ref 34.0–46.6)

## 2016-08-13 MED ORDER — TRAMADOL HCL 50 MG PO TABS: 50.0000 mg | ORAL_TABLET | Freq: Four times a day (QID) | ORAL | 0 refills | Status: DC | PRN

## 2016-08-13 NOTE — Telephone Encounter (Signed)
"  I have general body pain, upper arms and shoulder pain.  The doctor was to send Tramadol order in yesterday.  I found Naproxen ordered by Boelter on 01-23-2016.  Should I take the Naproxen?  If this is not what I should use, go ahead and call the Tramadol in to the pharmacy and tell them I will pick it up.  Call me back to let me [phone]) know which medicine I need."

## 2016-08-13 NOTE — Telephone Encounter (Signed)
This RN returned call to pt and informed her she can try the naproxen but will also have tramadol available for pick up at pharmacy if needed.  Verified pharmacy as [facility].

## 2016-08-15 ENCOUNTER — Other Ambulatory Visit: Payer: Self-pay | Admitting: Oncology

## 2016-08-16 DIAGNOSIS — I2510 Atherosclerotic heart disease of native coronary artery without angina pectoris: Secondary | ICD-10-CM | POA: Diagnosis not present

## 2016-08-16 DIAGNOSIS — C7951 Secondary malignant neoplasm of bone: Secondary | ICD-10-CM | POA: Diagnosis not present

## 2016-08-16 DIAGNOSIS — C50919 Malignant neoplasm of unspecified site of unspecified female breast: Secondary | ICD-10-CM | POA: Diagnosis not present

## 2016-08-16 DIAGNOSIS — Z23 Encounter for immunization: Secondary | ICD-10-CM | POA: Diagnosis not present

## 2016-08-16 DIAGNOSIS — C787 Secondary malignant neoplasm of liver and intrahepatic bile duct: Secondary | ICD-10-CM | POA: Diagnosis not present

## 2016-08-16 DIAGNOSIS — F339 Major depressive disorder, recurrent, unspecified: Secondary | ICD-10-CM | POA: Diagnosis not present

## 2016-08-16 DIAGNOSIS — H409 Unspecified glaucoma: Secondary | ICD-10-CM | POA: Diagnosis not present

## 2016-08-16 DIAGNOSIS — I10 Essential (primary) hypertension: Secondary | ICD-10-CM | POA: Diagnosis not present

## 2016-08-16 DIAGNOSIS — G2581 Restless legs syndrome: Secondary | ICD-10-CM | POA: Diagnosis not present

## 2016-08-16 DIAGNOSIS — C7800 Secondary malignant neoplasm of unspecified lung: Secondary | ICD-10-CM | POA: Diagnosis not present

## 2016-09-01 DIAGNOSIS — C50919 Malignant neoplasm of unspecified site of unspecified female breast: Secondary | ICD-10-CM | POA: Diagnosis not present

## 2016-09-01 DIAGNOSIS — C7800 Secondary malignant neoplasm of unspecified lung: Secondary | ICD-10-CM | POA: Diagnosis not present

## 2016-09-01 DIAGNOSIS — F339 Major depressive disorder, recurrent, unspecified: Secondary | ICD-10-CM | POA: Diagnosis not present

## 2016-09-01 DIAGNOSIS — I2510 Atherosclerotic heart disease of native coronary artery without angina pectoris: Secondary | ICD-10-CM | POA: Diagnosis not present

## 2016-09-01 DIAGNOSIS — C787 Secondary malignant neoplasm of liver and intrahepatic bile duct: Secondary | ICD-10-CM | POA: Diagnosis not present

## 2016-09-01 DIAGNOSIS — I10 Essential (primary) hypertension: Secondary | ICD-10-CM | POA: Diagnosis not present

## 2016-09-01 DIAGNOSIS — C7951 Secondary malignant neoplasm of bone: Secondary | ICD-10-CM | POA: Diagnosis not present

## 2016-09-01 DIAGNOSIS — G2581 Restless legs syndrome: Secondary | ICD-10-CM | POA: Diagnosis not present

## 2016-09-01 DIAGNOSIS — H409 Unspecified glaucoma: Secondary | ICD-10-CM | POA: Diagnosis not present

## 2016-09-04 ENCOUNTER — Other Ambulatory Visit: Payer: Self-pay | Admitting: Oncology

## 2016-09-06 NOTE — Telephone Encounter (Signed)
Chart reviewed.

## 2016-09-10 ENCOUNTER — Ambulatory Visit: Payer: Self-pay

## 2016-09-10 ENCOUNTER — Other Ambulatory Visit: Payer: Self-pay

## 2016-09-14 ENCOUNTER — Ambulatory Visit: Payer: Medicare Other

## 2016-09-14 ENCOUNTER — Ambulatory Visit (HOSPITAL_BASED_OUTPATIENT_CLINIC_OR_DEPARTMENT_OTHER): Payer: Medicare Other | Admitting: Oncology

## 2016-09-14 VITALS — BP 163/60 | HR 66 | Temp 98.2°F | Resp 18 | Ht 61.0 in | Wt 99.3 lb

## 2016-09-14 DIAGNOSIS — C50411 Malignant neoplasm of upper-outer quadrant of right female breast: Secondary | ICD-10-CM

## 2016-09-14 DIAGNOSIS — C7951 Secondary malignant neoplasm of bone: Secondary | ICD-10-CM | POA: Diagnosis not present

## 2016-09-14 DIAGNOSIS — M810 Age-related osteoporosis without current pathological fracture: Secondary | ICD-10-CM

## 2016-09-14 DIAGNOSIS — E538 Deficiency of other specified B group vitamins: Secondary | ICD-10-CM | POA: Diagnosis not present

## 2016-09-14 DIAGNOSIS — Z170 Estrogen receptor positive status [ER+]: Secondary | ICD-10-CM | POA: Diagnosis not present

## 2016-09-14 MED ORDER — PREDNISONE 5 MG PO TABS: 5.0000 mg | ORAL_TABLET | Freq: Every day | ORAL | 6 refills | Status: AC

## 2016-09-14 NOTE — Progress Notes (Signed)
ID: Alejandra Grant OB: [birth year]  MR#: [id number]  OZH#:[id number]  PCP: Vidal Schwalbe, MD GYN:   SU: Fanny Skates OTHER MD: Kyung Rudd, Todd McDiarmid, Izora Gala, Rupinder Orene Desanctis  CHIEF COMPLAINT: Estrogen receptor positive breast cancer  CURRENT TREATMENT: Comfort/palliative care under the care of hospice  BREAST CANCER HISTORY: From the prior summary note:  Alejandra Grant has a prior history of bilateral lumpectomies, bilateral rest irradiation, and prior treatment with tamoxifen (approximately 4 and half years) and an anastrozole (unknown duration). The data is being obtained from the archives.  More recently some new calcifications were noted in the right breast, and digital right breast mammography with magnification views confirmed a cluster in the posterior third of the breast spanning an area of 1.3 cm. Repeat within 6 months was suggested, and performed 04/02/2013. The patient's breasts are heterogeneously dense. The area of 1.3 cm of microcalcifications had increased in number, and spanned 1.6 cm and this study. Accordingly biopsy was obtained 04/18/2013, and this showed ([id number]) high-grade ductal carcinoma in situ, with a 2 mm area of invasive disease. The invasive tumor was estrogen receptor 100% positive, progesterone receptor negative, with an MIB-1 of 2% and no HER-2 amplification.  The patient's subsequent history is as detailed below  INTERVAL HISTORY: Alejandra Grant returns today for a review of her current situation accompanied by her son Alejandra Grant. The patient is currently under the care of hospice and she also receives support from a whole care group called [facility]. The [facility] aids are there from 10 in the morning to noon and from 5 in the evening to 7 PM. The patient and her son are very appreciative of these services  This son however felt that today's visit was an imposition and unnecessary. He did not see any reason why the patient had to be here  since she ready has hospice at home. I believe it is difficult for him also to get here since he lives out of town and transporting the patient in itself is difficult. He wondered why she was still scheduled for B12 shots, which was still on the books after the visit and if that was not necessary he wondered why we didn't eliminate that treatment. His specific words were that he holds himself in real estate to a higher standard then doing something that is not helpful just because it was being done before.   REVIEW OF SYSTEMS: Alejandra Grant tells me she feels very weak in the morning. She just has no energy. She does not wait for the aid 2, however but gets herself out of bed" but that is about all I can do". Later in the day she has a little bit more energy. Eating is her biggest problem. She also has pain in her jaw when she chews and is very difficult for her to chew on anything more then soft bread. She is using tramadol 4 times a day for the pain and that seems to be working well for her. She is doing the best she can to keep herself well fat and hydrated and she seems to have little trouble with liquids. Specifically she denies food going "the wrong way". Sometimes she makes funny noises in her throat when she swallows, and wonders if she is swallowing air. She has bowel movements every 2 or 3 days and sometimes they are hard. She denies falls. She does use a walker at home. Aside from these issues that review of systems today was stable  PAST MEDICAL HISTORY: Past Medical History:  Diagnosis Date  . Anemia   . Anxiety   . 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 Bone cancer ([place])   . Breast cancer ([place])   . Cancer ([facility])    breast, skin  . Depression   . Heart murmur   . Hypertension   . Paresthesia 08/21/2015  . Urinary incontinence     PAST SURGICAL HISTORY: Past Surgical History:  Procedure Laterality Date  . ABDOMINAL HYSTERECTOMY    . BACK SURGERY    . BREAST LUMPECTOMY  Bilateral    left 1991,right side 1994  . EYE SURGERY Bilateral    cat  . PARTIAL MASTECTOMY WITH NEEDLE LOCALIZATION Right 05/21/2013   Procedure: PARTIAL MASTECTOMY WITH NEEDLE LOCALIZATION;  Surgeon: Adin Hector, MD;  Location: [facility];  Service: General;  Laterality: Right;  . TONSILLECTOMY      FAMILY HISTORY No family history on file. The patient's mother died giving birth to the patient, at age 7. The patient did not grow up with her father and has very little information regarding him or his side of the family. She believes he died in his 51s in a state hospital. The patient had no brothers or sisters.  GYNECOLOGIC HISTORY:  The patient does not recall how old she was at menarche. She thinks she went through menopause in her 80s. She is GX P2 with first live birth at age 34. The patient is status post hysterectomy but does not know if her ovaries were removed or not.  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 She uses the [employer] and other support services to get around, do her shopping, etc. Son Alejandra Grant lives in [place] and works in Scientist, research (life sciences) estate. Son Alejandra Grant lives in [place] and is in Economist. The patient has 3 grandchildren. She attends the first [employer] locally.    ADVANCED DIRECTIVES: In place. The patient's son Alejandra Grant is her healthcare part turning. At the 12/12/2015 visit advanced directives were explicitly discussed with the patient, both children present. There was agreement that in case of a terminal event the patient should not be resuscitated, undergo CPR, or have live support.   HEALTH MAINTENANCE: Social History  Substance Use Topics  . Smoking status: Never Smoker  . Smokeless tobacco: Never Used  . Alcohol use No     Colonoscopy:  PAP:  Bone density: October 2008, showing osteoporosis (left hip T score -2.6)  Lipid panel:  Allergies  Allergen  Reactions  . Aspirin     Bleeding / rash    Current Outpatient Prescriptions  Medication Sig Dispense Refill  . carvedilol (COREG) 3.125 MG tablet 1 tablet 2 (two) times daily.    . chlorhexidine (PERIDEX) 0.12 % solution Use as directed 5 mLs in the mouth or throat 4 (four) times daily. Apply to ulceration /exposed bone 4 times daily as directed    . hydrALAZINE (APRESOLINE) 25 MG tablet Take 50 mg by mouth 4 (four) times daily.     . mirtazapine (REMERON) 30 MG tablet Take 30 mg by mouth at bedtime.     . Multiple Vitamins-Minerals (ICAPS MV PO) Take by mouth 1 day or 1 dose.    . potassium chloride (KLOR-CON) 8 MEQ tablet Take 16 mEq by mouth 2 (two) times daily.    . prednisoLONE acetate (PRED FORTE) 1 %  ophthalmic suspension Place 1 drop into the right eye 2 (two) times daily.    Marland Kitchen rOPINIRole (REQUIP) 1 MG tablet Take 1 mg by mouth at bedtime.    . timolol (TIMOPTIC) 0.5 % ophthalmic solution Place 1 drop into both eyes 2 (two) times daily.    . traMADol (ULTRAM) 50 MG tablet TAKE 1-2 TABLETS BY MOUTH EVERY 6 HOURS AS NEEDED FOR MODERATE PAIN. 60 tablet 2  . TRIBENZOR 40-10-25 MG TABS Take 1 tablet by mouth every morning.      No current facility-administered medications for this visit.     OBJECTIVE: Elderly white woman examined in a wheelchair Vitals:   09/14/16 1236  BP: (!) 163/60  Pulse: 66  Resp: 18  Temp: 98.2 F (36.8 C)     Body mass index is 18.76 kg/m.    ECOG FS: 2   Filed Weights   09/14/16 1236  Weight: 99 lb 4.8 oz (45 kg)    Right keratopathy as previously noted Oropharynx clear And moist; I don't see any obvious ulceration The left jaw is not appreciably swollen although it feels swollen to the patient Lungs no rales or rhonchi Heart regular rate and rhythm Abd soft, nontender, positive bowel sounds MSK kyphosis but no focal spinal tenderness Neuro: nonfocal, well oriented, appropriate affect Breasts: Deferred   LAB RESULTS:  CMP     Component  Value Date/Time   NA 139 08/12/2016 1352   K 4.4 08/12/2016 1352   CL 97 (L) 08/18/2015 1230   CL 102 04/25/2013 1250   CO2 28 08/12/2016 1352   GLUCOSE 92 08/12/2016 1352   GLUCOSE 120 (H) 04/25/2013 1250   BUN 21.7 08/12/2016 1352   CREATININE 0.7 08/12/2016 1352   CALCIUM 10.0 08/12/2016 1352   PROT 6.5 08/12/2016 1352   ALBUMIN 3.0 (L) 08/12/2016 1352   AST 28 08/12/2016 1352   ALT 14 08/12/2016 1352   ALKPHOS 93 08/12/2016 1352   BILITOT 0.29 08/12/2016 1352   GFRNONAA >60 08/18/2015 1230   GFRAA >60 08/18/2015 1230    I No results found for: SPEP  Lab Results  Component Value Date   WBC 6.6 08/12/2016   NEUTROABS 3.6 08/12/2016   HGB 9.9 (L) 08/12/2016   HCT 29.2 (L) 08/12/2016   HCT 29.9 (L) 08/12/2016   MCV 86.7 08/12/2016   PLT 185 08/12/2016      Chemistry      Component Value Date/Time   NA 139 08/12/2016 1352   K 4.4 08/12/2016 1352   CL 97 (L) 08/18/2015 1230   CL 102 04/25/2013 1250   CO2 28 08/12/2016 1352   BUN 21.7 08/12/2016 1352   CREATININE 0.7 08/12/2016 1352      Component Value Date/Time   CALCIUM 10.0 08/12/2016 1352   ALKPHOS 93 08/12/2016 1352   AST 28 08/12/2016 1352   ALT 14 08/12/2016 1352   BILITOT 0.29 08/12/2016 1352      Vitamin B12 211 - 946 pg/mL 199  352   27.29 0.0 - 38.6 U/mL 443.6  401.7CM  344.4CM      No results found for: LABCA2  No components found for: LABCA125  No results for input(s): INR in the last 168 hours.  Urinalysis    Component Value Date/Time   COLORURINE YELLOW 05/11/2013 1317   APPEARANCEUR CLEAR 05/11/2013 1317   LABSPEC 1.005 06/04/2016 1229   PHURINE 7.5 06/04/2016 1229   PHURINE 7.5 05/11/2013 1317   GLUCOSEU Negative 06/04/2016 1229  HGBUR Negative 06/04/2016 1229   HGBUR NEGATIVE 05/11/2013 1317   BILIRUBINUR Negative 06/04/2016 1229   KETONESUR Negative 06/04/2016 1229   KETONESUR NEGATIVE 05/11/2013 1317   PROTEINUR Negative 06/04/2016 1229   PROTEINUR NEGATIVE 05/11/2013  1317   UROBILINOGEN 0.2 06/04/2016 1229   NITRITE Negative 06/04/2016 1229   NITRITE NEGATIVE 05/11/2013 1317   LEUKOCYTESUR Negative 06/04/2016 1229    STUDIES: No results found.   ASSESSMENT: 82 y.o. [place] woman  (1) status post remote lumpectomies to both breasts, with adjuvant radiation bilaterally, and prior history of adjuvant tamoxifen and anastrozole  (2) Status post right breast biopsy 04/18/2013 for ductal carcinoma in situ, high-grade, with an area of microinvasion; the invasive cancer was estrogen receptor positive at 100%, progesterone receptor and HER-2 negative, with anMIB-1 of 2%.  (3)  status post right lumpectomy 05/21/2013 for a pT1b pnX, stage IA invasive ductal carcinoma, with negative margins (closest 1 mm from anterior margin)  (4) genetics testing discussed--patient and family prefer to forego genetic testing  (5) the patient had an 0.8 cm right lower lobe lung mass and a left posterior chest wall mass (likely hemangioma) noted on CT scans May 2015  (a) repeat Chest CT 08/08/2015 shows the right lower lobe mass to be unchanged  (b) left posterior chest wall mass has decreased in size c/w 03/01/2014 (6.2 cm to 3.4 cm)  (c) no further chest CT follow-up needed   (6) right breast biopsy 07/02/2015 shows a clinically T1c NX invasive lobular breast cancer,  Grade 2, strongly estrogen and progesterone receptor positive, with an MIB-1 of 5% and no HER-2 amplification   (7) anastrozole started September 2016 but interrupted initially because of peripheral neuropathy symptoms. These were felt to be unrelated and the patient's definitively resumed anastrozole October 2016  (a) osteoporosis, on alendronate--discontinued February 2017  (b) anastrozole discontinued October 2017 with evidence of disease progression   METASTATIC DISEASE: (8) PET scan 12/09/2015 shows innumerable bone lesions. 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 She also feels unusually weak early in the morning. I think she would benefit on both counts if she took prednisone 5 mg in the morning, and she can even do that before she gets out of bed to see if it gets her a little bit more energy and appetite.  The patient's son wondered if it was really necessary for her to receive a B12 shot and I agree that it is not so that has been canceled.  He felt that any future visits are best omitted because of cost and inconvenience issues. They just want to make sure that their mother is comfortable. I suggested a second concern would be safety and we discussed the risk of falls. In that regard perhaps it would be possible for her aide to come in a little earlier and help her get out of bed which is the time the patient seems to have the most trouble. For the same reason her hydralazine has been stopped.  At this point we have canceled all future appointments here but of course  I will be glad to see Fionna at any point in the future if or when the need arises. She has an out of facility DO NOT RESUSCITATE order in place.      Chauncey Cruel, MD   09/14/2016 12:38 PM

## 2016-09-27 ENCOUNTER — Telehealth: Payer: Self-pay

## 2016-09-27 NOTE — Telephone Encounter (Signed)
tonya with hospice called stating we have been adjusting pts hydralazine. She was taking 50 mg QID and we decreased it to 50 mg BID. Now her BP is trending upward. 11/6- 140/60, 11/9- 152/66, 11/13-140/62, [id number]. Kenney Houseman is asking if the pt could try 50 mg three times per day? Tonya's number [phone].

## 2016-09-29 NOTE — Telephone Encounter (Signed)
This RN called Kenney Houseman and informed her per MD may increase dose to TID.

## 2016-10-01 DIAGNOSIS — C7951 Secondary malignant neoplasm of bone: Secondary | ICD-10-CM | POA: Diagnosis not present

## 2016-10-01 DIAGNOSIS — C787 Secondary malignant neoplasm of liver and intrahepatic bile duct: Secondary | ICD-10-CM | POA: Diagnosis not present

## 2016-10-01 DIAGNOSIS — I10 Essential (primary) hypertension: Secondary | ICD-10-CM | POA: Diagnosis not present

## 2016-10-01 DIAGNOSIS — C50919 Malignant neoplasm of unspecified site of unspecified female breast: Secondary | ICD-10-CM | POA: Diagnosis not present

## 2016-10-01 DIAGNOSIS — C7800 Secondary malignant neoplasm of unspecified lung: Secondary | ICD-10-CM | POA: Diagnosis not present

## 2016-10-01 DIAGNOSIS — F339 Major depressive disorder, recurrent, unspecified: Secondary | ICD-10-CM | POA: Diagnosis not present

## 2016-10-01 DIAGNOSIS — G2581 Restless legs syndrome: Secondary | ICD-10-CM | POA: Diagnosis not present

## 2016-10-01 DIAGNOSIS — H409 Unspecified glaucoma: Secondary | ICD-10-CM | POA: Diagnosis not present

## 2016-10-01 DIAGNOSIS — I2510 Atherosclerotic heart disease of native coronary artery without angina pectoris: Secondary | ICD-10-CM | POA: Diagnosis not present

## 2016-10-04 DIAGNOSIS — C7800 Secondary malignant neoplasm of unspecified lung: Secondary | ICD-10-CM | POA: Diagnosis not present

## 2016-10-04 DIAGNOSIS — C7951 Secondary malignant neoplasm of bone: Secondary | ICD-10-CM | POA: Diagnosis not present

## 2016-10-04 DIAGNOSIS — F339 Major depressive disorder, recurrent, unspecified: Secondary | ICD-10-CM | POA: Diagnosis not present

## 2016-10-04 DIAGNOSIS — C787 Secondary malignant neoplasm of liver and intrahepatic bile duct: Secondary | ICD-10-CM | POA: Diagnosis not present

## 2016-10-04 DIAGNOSIS — I10 Essential (primary) hypertension: Secondary | ICD-10-CM | POA: Diagnosis not present

## 2016-10-04 DIAGNOSIS — C50919 Malignant neoplasm of unspecified site of unspecified female breast: Secondary | ICD-10-CM | POA: Diagnosis not present

## 2016-10-06 DIAGNOSIS — C7951 Secondary malignant neoplasm of bone: Secondary | ICD-10-CM | POA: Diagnosis not present

## 2016-10-06 DIAGNOSIS — F339 Major depressive disorder, recurrent, unspecified: Secondary | ICD-10-CM | POA: Diagnosis not present

## 2016-10-06 DIAGNOSIS — I10 Essential (primary) hypertension: Secondary | ICD-10-CM | POA: Diagnosis not present

## 2016-10-06 DIAGNOSIS — C7800 Secondary malignant neoplasm of unspecified lung: Secondary | ICD-10-CM | POA: Diagnosis not present

## 2016-10-06 DIAGNOSIS — C787 Secondary malignant neoplasm of liver and intrahepatic bile duct: Secondary | ICD-10-CM | POA: Diagnosis not present

## 2016-10-06 DIAGNOSIS — C50919 Malignant neoplasm of unspecified site of unspecified female breast: Secondary | ICD-10-CM | POA: Diagnosis not present

## 2016-10-08 ENCOUNTER — Other Ambulatory Visit: Payer: Self-pay

## 2016-10-08 ENCOUNTER — Ambulatory Visit: Payer: Self-pay

## 2016-10-08 DIAGNOSIS — C50919 Malignant neoplasm of unspecified site of unspecified female breast: Secondary | ICD-10-CM | POA: Diagnosis not present

## 2016-10-08 DIAGNOSIS — C7951 Secondary malignant neoplasm of bone: Secondary | ICD-10-CM | POA: Diagnosis not present

## 2016-10-08 DIAGNOSIS — C787 Secondary malignant neoplasm of liver and intrahepatic bile duct: Secondary | ICD-10-CM | POA: Diagnosis not present

## 2016-10-08 DIAGNOSIS — I10 Essential (primary) hypertension: Secondary | ICD-10-CM | POA: Diagnosis not present

## 2016-10-08 DIAGNOSIS — C7800 Secondary malignant neoplasm of unspecified lung: Secondary | ICD-10-CM | POA: Diagnosis not present

## 2016-10-08 DIAGNOSIS — F339 Major depressive disorder, recurrent, unspecified: Secondary | ICD-10-CM | POA: Diagnosis not present

## 2016-10-11 DIAGNOSIS — C787 Secondary malignant neoplasm of liver and intrahepatic bile duct: Secondary | ICD-10-CM | POA: Diagnosis not present

## 2016-10-11 DIAGNOSIS — C50919 Malignant neoplasm of unspecified site of unspecified female breast: Secondary | ICD-10-CM | POA: Diagnosis not present

## 2016-10-11 DIAGNOSIS — C7800 Secondary malignant neoplasm of unspecified lung: Secondary | ICD-10-CM | POA: Diagnosis not present

## 2016-10-11 DIAGNOSIS — C7951 Secondary malignant neoplasm of bone: Secondary | ICD-10-CM | POA: Diagnosis not present

## 2016-10-11 DIAGNOSIS — I10 Essential (primary) hypertension: Secondary | ICD-10-CM | POA: Diagnosis not present

## 2016-10-11 DIAGNOSIS — F339 Major depressive disorder, recurrent, unspecified: Secondary | ICD-10-CM | POA: Diagnosis not present

## 2016-10-12 DIAGNOSIS — I10 Essential (primary) hypertension: Secondary | ICD-10-CM | POA: Diagnosis not present

## 2016-10-12 DIAGNOSIS — C787 Secondary malignant neoplasm of liver and intrahepatic bile duct: Secondary | ICD-10-CM | POA: Diagnosis not present

## 2016-10-12 DIAGNOSIS — C50919 Malignant neoplasm of unspecified site of unspecified female breast: Secondary | ICD-10-CM | POA: Diagnosis not present

## 2016-10-12 DIAGNOSIS — F339 Major depressive disorder, recurrent, unspecified: Secondary | ICD-10-CM | POA: Diagnosis not present

## 2016-10-12 DIAGNOSIS — C7951 Secondary malignant neoplasm of bone: Secondary | ICD-10-CM | POA: Diagnosis not present

## 2016-10-12 DIAGNOSIS — C7800 Secondary malignant neoplasm of unspecified lung: Secondary | ICD-10-CM | POA: Diagnosis not present

## 2016-10-13 DIAGNOSIS — I10 Essential (primary) hypertension: Secondary | ICD-10-CM | POA: Diagnosis not present

## 2016-10-13 DIAGNOSIS — C50919 Malignant neoplasm of unspecified site of unspecified female breast: Secondary | ICD-10-CM | POA: Diagnosis not present

## 2016-10-13 DIAGNOSIS — C787 Secondary malignant neoplasm of liver and intrahepatic bile duct: Secondary | ICD-10-CM | POA: Diagnosis not present

## 2016-10-13 DIAGNOSIS — C7800 Secondary malignant neoplasm of unspecified lung: Secondary | ICD-10-CM | POA: Diagnosis not present

## 2016-10-13 DIAGNOSIS — F339 Major depressive disorder, recurrent, unspecified: Secondary | ICD-10-CM | POA: Diagnosis not present

## 2016-10-13 DIAGNOSIS — C7951 Secondary malignant neoplasm of bone: Secondary | ICD-10-CM | POA: Diagnosis not present

## 2016-10-14 DIAGNOSIS — Z961 Presence of intraocular lens: Secondary | ICD-10-CM | POA: Diagnosis not present

## 2016-10-14 DIAGNOSIS — H353290 Exudative age-related macular degeneration, unspecified eye, stage unspecified: Secondary | ICD-10-CM | POA: Diagnosis not present

## 2016-10-14 DIAGNOSIS — H353223 Exudative age-related macular degeneration, left eye, with inactive scar: Secondary | ICD-10-CM | POA: Diagnosis not present

## 2016-10-14 DIAGNOSIS — H43812 Vitreous degeneration, left eye: Secondary | ICD-10-CM | POA: Diagnosis not present

## 2016-10-14 DIAGNOSIS — H18421 Band keratopathy, right eye: Secondary | ICD-10-CM | POA: Diagnosis not present

## 2016-10-15 DIAGNOSIS — F339 Major depressive disorder, recurrent, unspecified: Secondary | ICD-10-CM | POA: Diagnosis not present

## 2016-10-15 DIAGNOSIS — I10 Essential (primary) hypertension: Secondary | ICD-10-CM | POA: Diagnosis not present

## 2016-10-15 DIAGNOSIS — C7800 Secondary malignant neoplasm of unspecified lung: Secondary | ICD-10-CM | POA: Diagnosis not present

## 2016-10-15 DIAGNOSIS — C787 Secondary malignant neoplasm of liver and intrahepatic bile duct: Secondary | ICD-10-CM | POA: Diagnosis not present

## 2016-10-15 DIAGNOSIS — C7951 Secondary malignant neoplasm of bone: Secondary | ICD-10-CM | POA: Diagnosis not present

## 2016-10-15 DIAGNOSIS — C50919 Malignant neoplasm of unspecified site of unspecified female breast: Secondary | ICD-10-CM | POA: Diagnosis not present

## 2016-10-18 DIAGNOSIS — C787 Secondary malignant neoplasm of liver and intrahepatic bile duct: Secondary | ICD-10-CM | POA: Diagnosis not present

## 2016-10-18 DIAGNOSIS — C50919 Malignant neoplasm of unspecified site of unspecified female breast: Secondary | ICD-10-CM | POA: Diagnosis not present

## 2016-10-18 DIAGNOSIS — F339 Major depressive disorder, recurrent, unspecified: Secondary | ICD-10-CM | POA: Diagnosis not present

## 2016-10-18 DIAGNOSIS — I10 Essential (primary) hypertension: Secondary | ICD-10-CM | POA: Diagnosis not present

## 2016-10-18 DIAGNOSIS — C7951 Secondary malignant neoplasm of bone: Secondary | ICD-10-CM | POA: Diagnosis not present

## 2016-10-18 DIAGNOSIS — C7800 Secondary malignant neoplasm of unspecified lung: Secondary | ICD-10-CM | POA: Diagnosis not present

## 2016-10-20 DIAGNOSIS — C50919 Malignant neoplasm of unspecified site of unspecified female breast: Secondary | ICD-10-CM | POA: Diagnosis not present

## 2016-10-20 DIAGNOSIS — F339 Major depressive disorder, recurrent, unspecified: Secondary | ICD-10-CM | POA: Diagnosis not present

## 2016-10-20 DIAGNOSIS — I10 Essential (primary) hypertension: Secondary | ICD-10-CM | POA: Diagnosis not present

## 2016-10-20 DIAGNOSIS — C787 Secondary malignant neoplasm of liver and intrahepatic bile duct: Secondary | ICD-10-CM | POA: Diagnosis not present

## 2016-10-20 DIAGNOSIS — C7951 Secondary malignant neoplasm of bone: Secondary | ICD-10-CM | POA: Diagnosis not present

## 2016-10-20 DIAGNOSIS — C7800 Secondary malignant neoplasm of unspecified lung: Secondary | ICD-10-CM | POA: Diagnosis not present

## 2016-10-22 DIAGNOSIS — C7951 Secondary malignant neoplasm of bone: Secondary | ICD-10-CM | POA: Diagnosis not present

## 2016-10-22 DIAGNOSIS — I10 Essential (primary) hypertension: Secondary | ICD-10-CM | POA: Diagnosis not present

## 2016-10-22 DIAGNOSIS — F339 Major depressive disorder, recurrent, unspecified: Secondary | ICD-10-CM | POA: Diagnosis not present

## 2016-10-22 DIAGNOSIS — C50919 Malignant neoplasm of unspecified site of unspecified female breast: Secondary | ICD-10-CM | POA: Diagnosis not present

## 2016-10-22 DIAGNOSIS — C7800 Secondary malignant neoplasm of unspecified lung: Secondary | ICD-10-CM | POA: Diagnosis not present

## 2016-10-22 DIAGNOSIS — C787 Secondary malignant neoplasm of liver and intrahepatic bile duct: Secondary | ICD-10-CM | POA: Diagnosis not present

## 2016-10-27 DIAGNOSIS — C50919 Malignant neoplasm of unspecified site of unspecified female breast: Secondary | ICD-10-CM | POA: Diagnosis not present

## 2016-10-27 DIAGNOSIS — I10 Essential (primary) hypertension: Secondary | ICD-10-CM | POA: Diagnosis not present

## 2016-10-27 DIAGNOSIS — C7951 Secondary malignant neoplasm of bone: Secondary | ICD-10-CM | POA: Diagnosis not present

## 2016-10-27 DIAGNOSIS — F339 Major depressive disorder, recurrent, unspecified: Secondary | ICD-10-CM | POA: Diagnosis not present

## 2016-10-27 DIAGNOSIS — C7800 Secondary malignant neoplasm of unspecified lung: Secondary | ICD-10-CM | POA: Diagnosis not present

## 2016-10-27 DIAGNOSIS — C787 Secondary malignant neoplasm of liver and intrahepatic bile duct: Secondary | ICD-10-CM | POA: Diagnosis not present

## 2016-10-29 DIAGNOSIS — I10 Essential (primary) hypertension: Secondary | ICD-10-CM | POA: Diagnosis not present

## 2016-10-29 DIAGNOSIS — C50919 Malignant neoplasm of unspecified site of unspecified female breast: Secondary | ICD-10-CM | POA: Diagnosis not present

## 2016-10-29 DIAGNOSIS — F339 Major depressive disorder, recurrent, unspecified: Secondary | ICD-10-CM | POA: Diagnosis not present

## 2016-10-29 DIAGNOSIS — C7800 Secondary malignant neoplasm of unspecified lung: Secondary | ICD-10-CM | POA: Diagnosis not present

## 2016-10-29 DIAGNOSIS — C7951 Secondary malignant neoplasm of bone: Secondary | ICD-10-CM | POA: Diagnosis not present

## 2016-10-29 DIAGNOSIS — C787 Secondary malignant neoplasm of liver and intrahepatic bile duct: Secondary | ICD-10-CM | POA: Diagnosis not present

## 2016-11-01 DIAGNOSIS — C7800 Secondary malignant neoplasm of unspecified lung: Secondary | ICD-10-CM | POA: Diagnosis not present

## 2016-11-01 DIAGNOSIS — C787 Secondary malignant neoplasm of liver and intrahepatic bile duct: Secondary | ICD-10-CM | POA: Diagnosis not present

## 2016-11-01 DIAGNOSIS — F339 Major depressive disorder, recurrent, unspecified: Secondary | ICD-10-CM | POA: Diagnosis not present

## 2016-11-01 DIAGNOSIS — C50919 Malignant neoplasm of unspecified site of unspecified female breast: Secondary | ICD-10-CM | POA: Diagnosis not present

## 2016-11-01 DIAGNOSIS — I2510 Atherosclerotic heart disease of native coronary artery without angina pectoris: Secondary | ICD-10-CM | POA: Diagnosis not present

## 2016-11-01 DIAGNOSIS — C7951 Secondary malignant neoplasm of bone: Secondary | ICD-10-CM | POA: Diagnosis not present

## 2016-11-01 DIAGNOSIS — I10 Essential (primary) hypertension: Secondary | ICD-10-CM | POA: Diagnosis not present

## 2016-11-01 DIAGNOSIS — H409 Unspecified glaucoma: Secondary | ICD-10-CM | POA: Diagnosis not present

## 2016-11-01 DIAGNOSIS — G2581 Restless legs syndrome: Secondary | ICD-10-CM | POA: Diagnosis not present

## 2016-11-03 DIAGNOSIS — C50919 Malignant neoplasm of unspecified site of unspecified female breast: Secondary | ICD-10-CM | POA: Diagnosis not present

## 2016-11-03 DIAGNOSIS — F339 Major depressive disorder, recurrent, unspecified: Secondary | ICD-10-CM | POA: Diagnosis not present

## 2016-11-03 DIAGNOSIS — C787 Secondary malignant neoplasm of liver and intrahepatic bile duct: Secondary | ICD-10-CM | POA: Diagnosis not present

## 2016-11-03 DIAGNOSIS — C7951 Secondary malignant neoplasm of bone: Secondary | ICD-10-CM | POA: Diagnosis not present

## 2016-11-03 DIAGNOSIS — C7800 Secondary malignant neoplasm of unspecified lung: Secondary | ICD-10-CM | POA: Diagnosis not present

## 2016-11-03 DIAGNOSIS — I10 Essential (primary) hypertension: Secondary | ICD-10-CM | POA: Diagnosis not present

## 2016-11-05 DIAGNOSIS — C7800 Secondary malignant neoplasm of unspecified lung: Secondary | ICD-10-CM | POA: Diagnosis not present

## 2016-11-05 DIAGNOSIS — I10 Essential (primary) hypertension: Secondary | ICD-10-CM | POA: Diagnosis not present

## 2016-11-05 DIAGNOSIS — C50919 Malignant neoplasm of unspecified site of unspecified female breast: Secondary | ICD-10-CM | POA: Diagnosis not present

## 2016-11-05 DIAGNOSIS — C787 Secondary malignant neoplasm of liver and intrahepatic bile duct: Secondary | ICD-10-CM | POA: Diagnosis not present

## 2016-11-05 DIAGNOSIS — C7951 Secondary malignant neoplasm of bone: Secondary | ICD-10-CM | POA: Diagnosis not present

## 2016-11-05 DIAGNOSIS — F339 Major depressive disorder, recurrent, unspecified: Secondary | ICD-10-CM | POA: Diagnosis not present

## 2016-11-08 DIAGNOSIS — I10 Essential (primary) hypertension: Secondary | ICD-10-CM | POA: Diagnosis not present

## 2016-11-08 DIAGNOSIS — F339 Major depressive disorder, recurrent, unspecified: Secondary | ICD-10-CM | POA: Diagnosis not present

## 2016-11-08 DIAGNOSIS — C7800 Secondary malignant neoplasm of unspecified lung: Secondary | ICD-10-CM | POA: Diagnosis not present

## 2016-11-08 DIAGNOSIS — C7951 Secondary malignant neoplasm of bone: Secondary | ICD-10-CM | POA: Diagnosis not present

## 2016-11-08 DIAGNOSIS — C50919 Malignant neoplasm of unspecified site of unspecified female breast: Secondary | ICD-10-CM | POA: Diagnosis not present

## 2016-11-08 DIAGNOSIS — C787 Secondary malignant neoplasm of liver and intrahepatic bile duct: Secondary | ICD-10-CM | POA: Diagnosis not present

## 2016-11-10 DIAGNOSIS — F339 Major depressive disorder, recurrent, unspecified: Secondary | ICD-10-CM | POA: Diagnosis not present

## 2016-11-10 DIAGNOSIS — I10 Essential (primary) hypertension: Secondary | ICD-10-CM | POA: Diagnosis not present

## 2016-11-10 DIAGNOSIS — C7800 Secondary malignant neoplasm of unspecified lung: Secondary | ICD-10-CM | POA: Diagnosis not present

## 2016-11-10 DIAGNOSIS — C50919 Malignant neoplasm of unspecified site of unspecified female breast: Secondary | ICD-10-CM | POA: Diagnosis not present

## 2016-11-10 DIAGNOSIS — C7951 Secondary malignant neoplasm of bone: Secondary | ICD-10-CM | POA: Diagnosis not present

## 2016-11-10 DIAGNOSIS — C787 Secondary malignant neoplasm of liver and intrahepatic bile duct: Secondary | ICD-10-CM | POA: Diagnosis not present

## 2016-11-11 DIAGNOSIS — I10 Essential (primary) hypertension: Secondary | ICD-10-CM | POA: Diagnosis not present

## 2016-11-11 DIAGNOSIS — C7951 Secondary malignant neoplasm of bone: Secondary | ICD-10-CM | POA: Diagnosis not present

## 2016-11-11 DIAGNOSIS — C50919 Malignant neoplasm of unspecified site of unspecified female breast: Secondary | ICD-10-CM | POA: Diagnosis not present

## 2016-11-11 DIAGNOSIS — F339 Major depressive disorder, recurrent, unspecified: Secondary | ICD-10-CM | POA: Diagnosis not present

## 2016-11-11 DIAGNOSIS — C787 Secondary malignant neoplasm of liver and intrahepatic bile duct: Secondary | ICD-10-CM | POA: Diagnosis not present

## 2016-11-11 DIAGNOSIS — C7800 Secondary malignant neoplasm of unspecified lung: Secondary | ICD-10-CM | POA: Diagnosis not present

## 2016-11-12 DIAGNOSIS — C7800 Secondary malignant neoplasm of unspecified lung: Secondary | ICD-10-CM | POA: Diagnosis not present

## 2016-11-12 DIAGNOSIS — F339 Major depressive disorder, recurrent, unspecified: Secondary | ICD-10-CM | POA: Diagnosis not present

## 2016-11-12 DIAGNOSIS — C787 Secondary malignant neoplasm of liver and intrahepatic bile duct: Secondary | ICD-10-CM | POA: Diagnosis not present

## 2016-11-12 DIAGNOSIS — I10 Essential (primary) hypertension: Secondary | ICD-10-CM | POA: Diagnosis not present

## 2016-11-12 DIAGNOSIS — C50919 Malignant neoplasm of unspecified site of unspecified female breast: Secondary | ICD-10-CM | POA: Diagnosis not present

## 2016-11-12 DIAGNOSIS — C7951 Secondary malignant neoplasm of bone: Secondary | ICD-10-CM | POA: Diagnosis not present

## 2016-11-15 DIAGNOSIS — F339 Major depressive disorder, recurrent, unspecified: Secondary | ICD-10-CM | POA: Diagnosis not present

## 2016-11-15 DIAGNOSIS — C7800 Secondary malignant neoplasm of unspecified lung: Secondary | ICD-10-CM | POA: Diagnosis not present

## 2016-11-15 DIAGNOSIS — I10 Essential (primary) hypertension: Secondary | ICD-10-CM | POA: Diagnosis not present

## 2016-11-15 DIAGNOSIS — C787 Secondary malignant neoplasm of liver and intrahepatic bile duct: Secondary | ICD-10-CM | POA: Diagnosis not present

## 2016-11-15 DIAGNOSIS — C7951 Secondary malignant neoplasm of bone: Secondary | ICD-10-CM | POA: Diagnosis not present

## 2016-11-15 DIAGNOSIS — C50919 Malignant neoplasm of unspecified site of unspecified female breast: Secondary | ICD-10-CM | POA: Diagnosis not present

## 2016-11-19 DIAGNOSIS — C7800 Secondary malignant neoplasm of unspecified lung: Secondary | ICD-10-CM | POA: Diagnosis not present

## 2016-11-19 DIAGNOSIS — C50919 Malignant neoplasm of unspecified site of unspecified female breast: Secondary | ICD-10-CM | POA: Diagnosis not present

## 2016-11-19 DIAGNOSIS — C7951 Secondary malignant neoplasm of bone: Secondary | ICD-10-CM | POA: Diagnosis not present

## 2016-11-19 DIAGNOSIS — F339 Major depressive disorder, recurrent, unspecified: Secondary | ICD-10-CM | POA: Diagnosis not present

## 2016-11-19 DIAGNOSIS — I10 Essential (primary) hypertension: Secondary | ICD-10-CM | POA: Diagnosis not present

## 2016-11-19 DIAGNOSIS — C787 Secondary malignant neoplasm of liver and intrahepatic bile duct: Secondary | ICD-10-CM | POA: Diagnosis not present

## 2016-11-22 DIAGNOSIS — I10 Essential (primary) hypertension: Secondary | ICD-10-CM | POA: Diagnosis not present

## 2016-11-22 DIAGNOSIS — F339 Major depressive disorder, recurrent, unspecified: Secondary | ICD-10-CM | POA: Diagnosis not present

## 2016-11-22 DIAGNOSIS — C7800 Secondary malignant neoplasm of unspecified lung: Secondary | ICD-10-CM | POA: Diagnosis not present

## 2016-11-22 DIAGNOSIS — C50919 Malignant neoplasm of unspecified site of unspecified female breast: Secondary | ICD-10-CM | POA: Diagnosis not present

## 2016-11-22 DIAGNOSIS — C7951 Secondary malignant neoplasm of bone: Secondary | ICD-10-CM | POA: Diagnosis not present

## 2016-11-22 DIAGNOSIS — C787 Secondary malignant neoplasm of liver and intrahepatic bile duct: Secondary | ICD-10-CM | POA: Diagnosis not present

## 2016-11-24 DIAGNOSIS — I10 Essential (primary) hypertension: Secondary | ICD-10-CM | POA: Diagnosis not present

## 2016-11-24 DIAGNOSIS — C50919 Malignant neoplasm of unspecified site of unspecified female breast: Secondary | ICD-10-CM | POA: Diagnosis not present

## 2016-11-24 DIAGNOSIS — C787 Secondary malignant neoplasm of liver and intrahepatic bile duct: Secondary | ICD-10-CM | POA: Diagnosis not present

## 2016-11-24 DIAGNOSIS — C7800 Secondary malignant neoplasm of unspecified lung: Secondary | ICD-10-CM | POA: Diagnosis not present

## 2016-11-24 DIAGNOSIS — F339 Major depressive disorder, recurrent, unspecified: Secondary | ICD-10-CM | POA: Diagnosis not present

## 2016-11-24 DIAGNOSIS — C7951 Secondary malignant neoplasm of bone: Secondary | ICD-10-CM | POA: Diagnosis not present

## 2016-11-26 DIAGNOSIS — C787 Secondary malignant neoplasm of liver and intrahepatic bile duct: Secondary | ICD-10-CM | POA: Diagnosis not present

## 2016-11-26 DIAGNOSIS — F339 Major depressive disorder, recurrent, unspecified: Secondary | ICD-10-CM | POA: Diagnosis not present

## 2016-11-26 DIAGNOSIS — C7951 Secondary malignant neoplasm of bone: Secondary | ICD-10-CM | POA: Diagnosis not present

## 2016-11-26 DIAGNOSIS — I10 Essential (primary) hypertension: Secondary | ICD-10-CM | POA: Diagnosis not present

## 2016-11-26 DIAGNOSIS — C7800 Secondary malignant neoplasm of unspecified lung: Secondary | ICD-10-CM | POA: Diagnosis not present

## 2016-11-26 DIAGNOSIS — C50919 Malignant neoplasm of unspecified site of unspecified female breast: Secondary | ICD-10-CM | POA: Diagnosis not present

## 2016-11-29 DIAGNOSIS — I10 Essential (primary) hypertension: Secondary | ICD-10-CM | POA: Diagnosis not present

## 2016-11-29 DIAGNOSIS — F339 Major depressive disorder, recurrent, unspecified: Secondary | ICD-10-CM | POA: Diagnosis not present

## 2016-11-29 DIAGNOSIS — C787 Secondary malignant neoplasm of liver and intrahepatic bile duct: Secondary | ICD-10-CM | POA: Diagnosis not present

## 2016-11-29 DIAGNOSIS — C7951 Secondary malignant neoplasm of bone: Secondary | ICD-10-CM | POA: Diagnosis not present

## 2016-11-29 DIAGNOSIS — C50919 Malignant neoplasm of unspecified site of unspecified female breast: Secondary | ICD-10-CM | POA: Diagnosis not present

## 2016-11-29 DIAGNOSIS — C7800 Secondary malignant neoplasm of unspecified lung: Secondary | ICD-10-CM | POA: Diagnosis not present

## 2016-12-01 DIAGNOSIS — I10 Essential (primary) hypertension: Secondary | ICD-10-CM | POA: Diagnosis not present

## 2016-12-01 DIAGNOSIS — C7800 Secondary malignant neoplasm of unspecified lung: Secondary | ICD-10-CM | POA: Diagnosis not present

## 2016-12-01 DIAGNOSIS — F339 Major depressive disorder, recurrent, unspecified: Secondary | ICD-10-CM | POA: Diagnosis not present

## 2016-12-01 DIAGNOSIS — C7951 Secondary malignant neoplasm of bone: Secondary | ICD-10-CM | POA: Diagnosis not present

## 2016-12-01 DIAGNOSIS — C50919 Malignant neoplasm of unspecified site of unspecified female breast: Secondary | ICD-10-CM | POA: Diagnosis not present

## 2016-12-01 DIAGNOSIS — C787 Secondary malignant neoplasm of liver and intrahepatic bile duct: Secondary | ICD-10-CM | POA: Diagnosis not present

## 2016-12-02 DIAGNOSIS — C787 Secondary malignant neoplasm of liver and intrahepatic bile duct: Secondary | ICD-10-CM | POA: Diagnosis not present

## 2016-12-02 DIAGNOSIS — C50919 Malignant neoplasm of unspecified site of unspecified female breast: Secondary | ICD-10-CM | POA: Diagnosis not present

## 2016-12-02 DIAGNOSIS — I10 Essential (primary) hypertension: Secondary | ICD-10-CM | POA: Diagnosis not present

## 2016-12-02 DIAGNOSIS — I2510 Atherosclerotic heart disease of native coronary artery without angina pectoris: Secondary | ICD-10-CM | POA: Diagnosis not present

## 2016-12-02 DIAGNOSIS — C7951 Secondary malignant neoplasm of bone: Secondary | ICD-10-CM | POA: Diagnosis not present

## 2016-12-02 DIAGNOSIS — G2581 Restless legs syndrome: Secondary | ICD-10-CM | POA: Diagnosis not present

## 2016-12-02 DIAGNOSIS — F339 Major depressive disorder, recurrent, unspecified: Secondary | ICD-10-CM | POA: Diagnosis not present

## 2016-12-02 DIAGNOSIS — H409 Unspecified glaucoma: Secondary | ICD-10-CM | POA: Diagnosis not present

## 2016-12-02 DIAGNOSIS — C7800 Secondary malignant neoplasm of unspecified lung: Secondary | ICD-10-CM | POA: Diagnosis not present

## 2016-12-03 DIAGNOSIS — C7951 Secondary malignant neoplasm of bone: Secondary | ICD-10-CM | POA: Diagnosis not present

## 2016-12-03 DIAGNOSIS — F339 Major depressive disorder, recurrent, unspecified: Secondary | ICD-10-CM | POA: Diagnosis not present

## 2016-12-03 DIAGNOSIS — C7800 Secondary malignant neoplasm of unspecified lung: Secondary | ICD-10-CM | POA: Diagnosis not present

## 2016-12-03 DIAGNOSIS — C50919 Malignant neoplasm of unspecified site of unspecified female breast: Secondary | ICD-10-CM | POA: Diagnosis not present

## 2016-12-03 DIAGNOSIS — I10 Essential (primary) hypertension: Secondary | ICD-10-CM | POA: Diagnosis not present

## 2016-12-03 DIAGNOSIS — C787 Secondary malignant neoplasm of liver and intrahepatic bile duct: Secondary | ICD-10-CM | POA: Diagnosis not present

## 2016-12-04 DIAGNOSIS — F339 Major depressive disorder, recurrent, unspecified: Secondary | ICD-10-CM | POA: Diagnosis not present

## 2016-12-04 DIAGNOSIS — I10 Essential (primary) hypertension: Secondary | ICD-10-CM | POA: Diagnosis not present

## 2016-12-04 DIAGNOSIS — C787 Secondary malignant neoplasm of liver and intrahepatic bile duct: Secondary | ICD-10-CM | POA: Diagnosis not present

## 2016-12-04 DIAGNOSIS — C50919 Malignant neoplasm of unspecified site of unspecified female breast: Secondary | ICD-10-CM | POA: Diagnosis not present

## 2016-12-04 DIAGNOSIS — C7800 Secondary malignant neoplasm of unspecified lung: Secondary | ICD-10-CM | POA: Diagnosis not present

## 2016-12-04 DIAGNOSIS — C7951 Secondary malignant neoplasm of bone: Secondary | ICD-10-CM | POA: Diagnosis not present

## 2016-12-06 DIAGNOSIS — C7951 Secondary malignant neoplasm of bone: Secondary | ICD-10-CM | POA: Diagnosis not present

## 2016-12-06 DIAGNOSIS — C787 Secondary malignant neoplasm of liver and intrahepatic bile duct: Secondary | ICD-10-CM | POA: Diagnosis not present

## 2016-12-06 DIAGNOSIS — C7800 Secondary malignant neoplasm of unspecified lung: Secondary | ICD-10-CM | POA: Diagnosis not present

## 2016-12-06 DIAGNOSIS — F339 Major depressive disorder, recurrent, unspecified: Secondary | ICD-10-CM | POA: Diagnosis not present

## 2016-12-06 DIAGNOSIS — I10 Essential (primary) hypertension: Secondary | ICD-10-CM | POA: Diagnosis not present

## 2016-12-06 DIAGNOSIS — C50919 Malignant neoplasm of unspecified site of unspecified female breast: Secondary | ICD-10-CM | POA: Diagnosis not present

## 2016-12-07 ENCOUNTER — Telehealth: Payer: Self-pay | Admitting: *Deleted

## 2016-12-07 DIAGNOSIS — C787 Secondary malignant neoplasm of liver and intrahepatic bile duct: Secondary | ICD-10-CM | POA: Diagnosis not present

## 2016-12-07 DIAGNOSIS — C50919 Malignant neoplasm of unspecified site of unspecified female breast: Secondary | ICD-10-CM | POA: Diagnosis not present

## 2016-12-07 DIAGNOSIS — C7951 Secondary malignant neoplasm of bone: Secondary | ICD-10-CM | POA: Diagnosis not present

## 2016-12-07 DIAGNOSIS — I10 Essential (primary) hypertension: Secondary | ICD-10-CM | POA: Diagnosis not present

## 2016-12-07 DIAGNOSIS — F339 Major depressive disorder, recurrent, unspecified: Secondary | ICD-10-CM | POA: Diagnosis not present

## 2016-12-07 DIAGNOSIS — C7800 Secondary malignant neoplasm of unspecified lung: Secondary | ICD-10-CM | POA: Diagnosis not present

## 2016-12-07 NOTE — Telephone Encounter (Addendum)
"  This is Marine scientist at [place].  Please notify Dr. Jana Hakim and his nurse, this patient was admitted to [facility] on December 11, 2016.  She passed away today at 3:55 pm."

## 2016-12-30 DEATH — deceased

## 2017-02-06 IMAGING — CT CT CHEST W/ CM
2 of 4 series · 15 of 36 positions shown, 18 images · IV contrast (iopamidol)
Comparison: 03/18/2016

CLINICAL DATA: Bilateral breast cancer.

EXAM:
CT CHEST WITH CONTRAST
TECHNIQUE: Multidetector CT imaging of the chest was performed during
intravenous contrast administration.
CONTRAST:  75mL 1YPT49-3EE IOPAMIDOL (1YPT49-3EE) INJECTION 61%

[Series 2: chest with st · axial · 0.62mm/px · z∈[-95,+121]mm · 12 of 128 slices shown, 15 images]
[im 10/128  mediastinal]
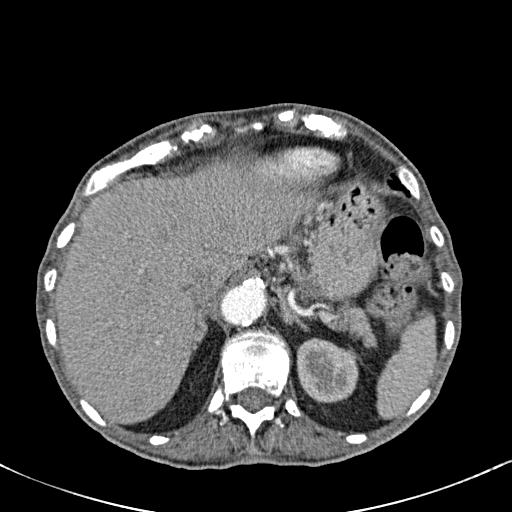
[im 10/128  lung]
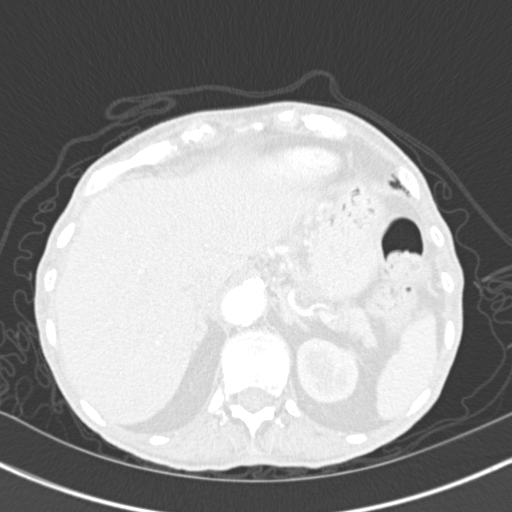
[im 19/128  lung]
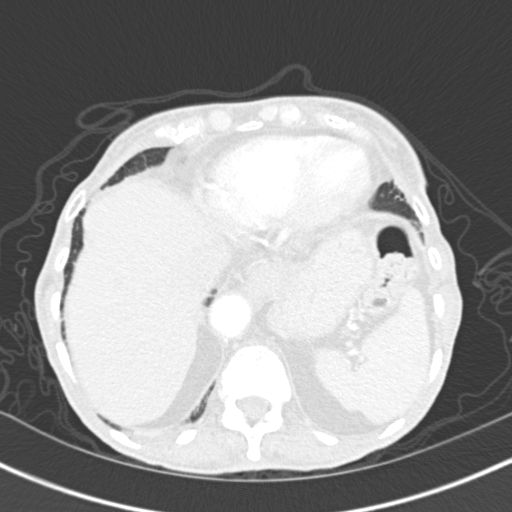
[im 28/128  lung]
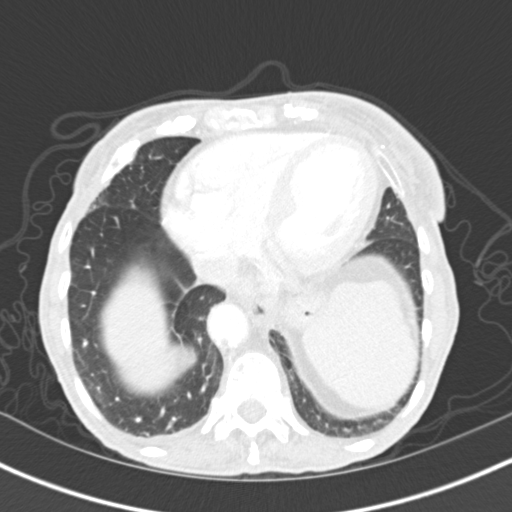
[im 37/128  lung]
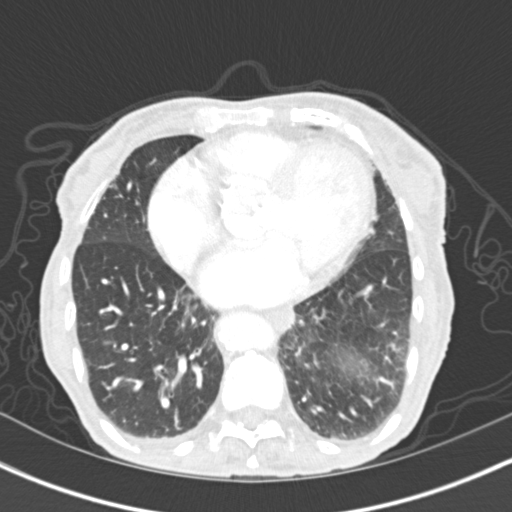
[im 46/128  mediastinal]
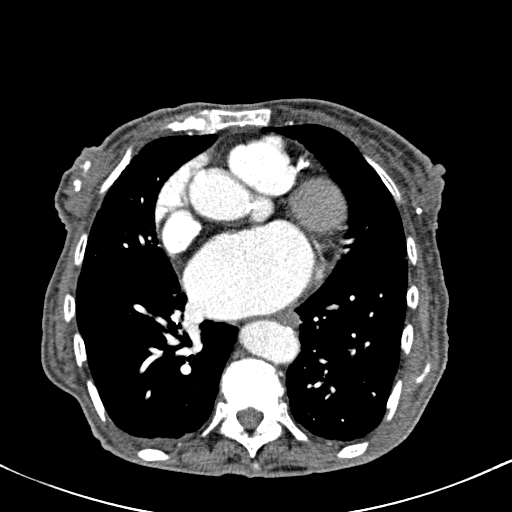
[im 46/128  lung]
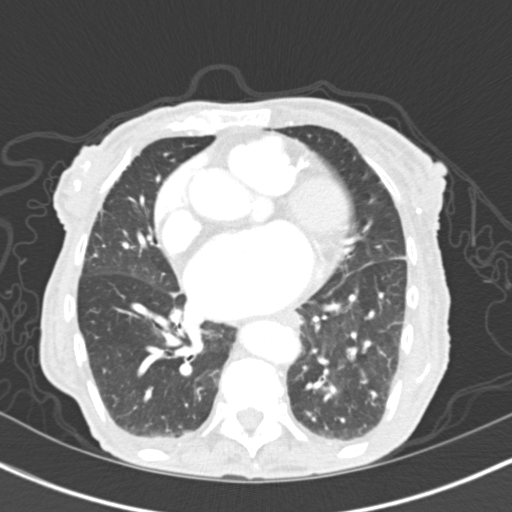
[im 55/128  lung]
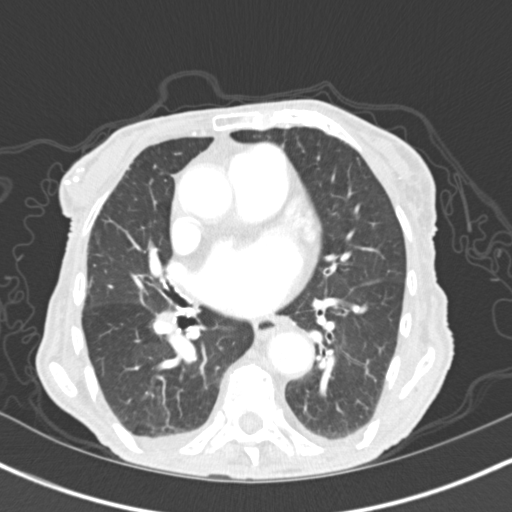
[im 73/128  lung]
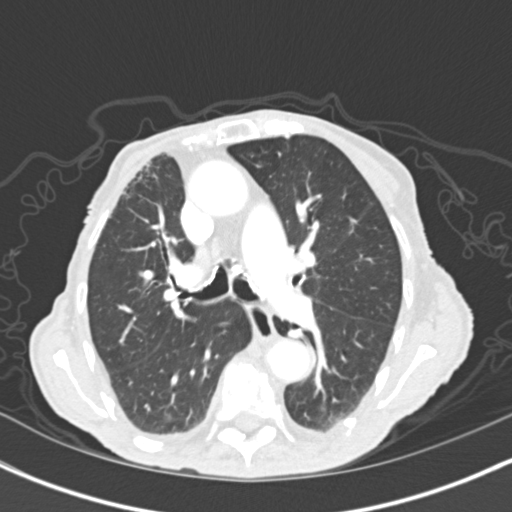
[im 82/128  lung]
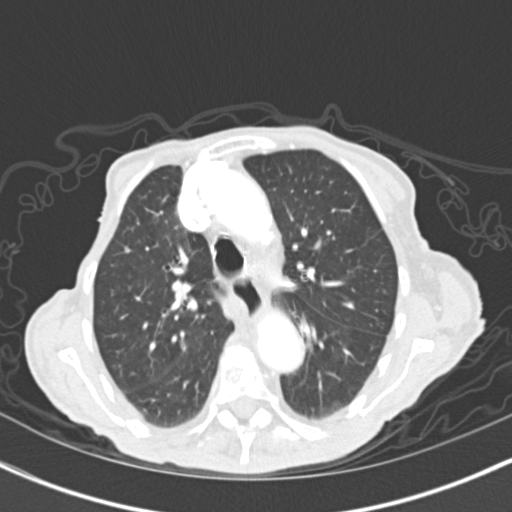
[im 91/128  mediastinal]
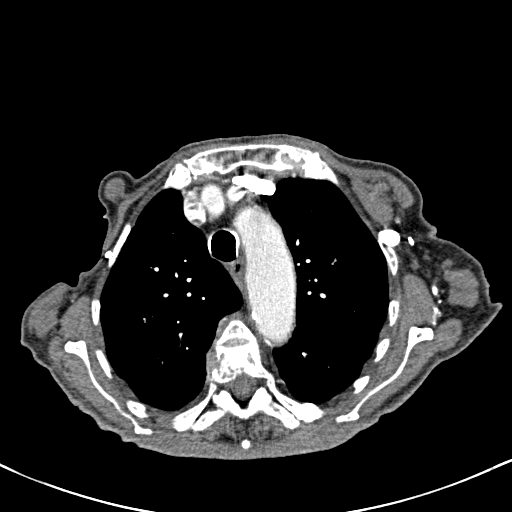
[im 91/128  lung]
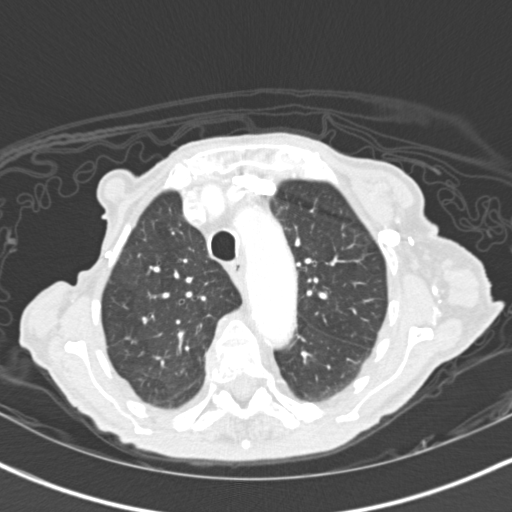
[im 100/128  lung]
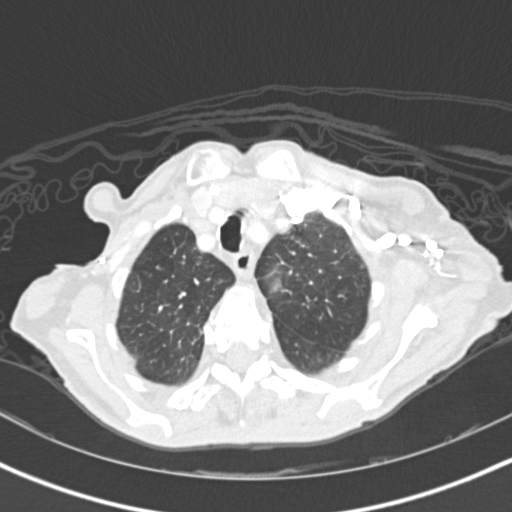
[im 109/128  lung]
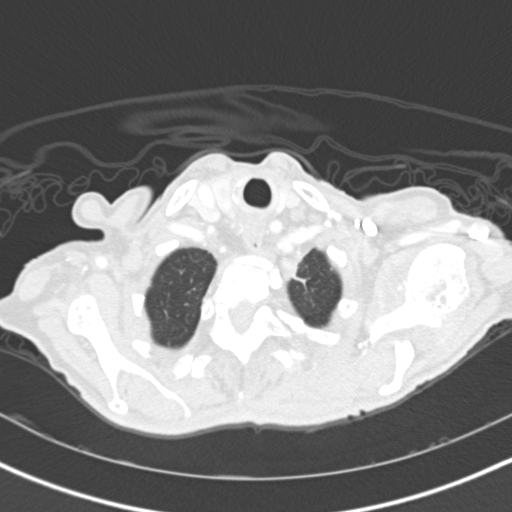
[im 118/128  lung]
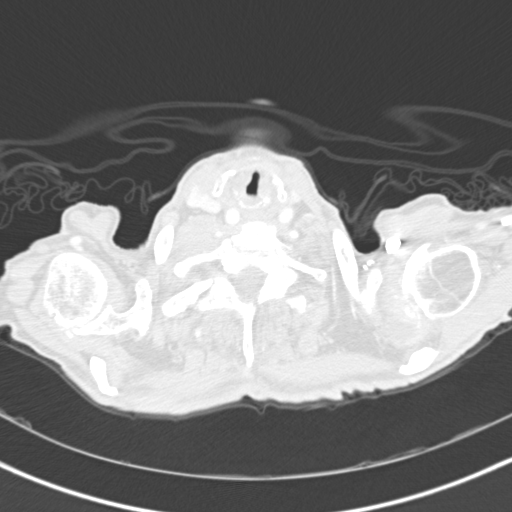

[Series 603: <mpr thick range(1)> · coronal · 0.62mm/px · 3 of 113 slices shown]
[im 23/113  lung]
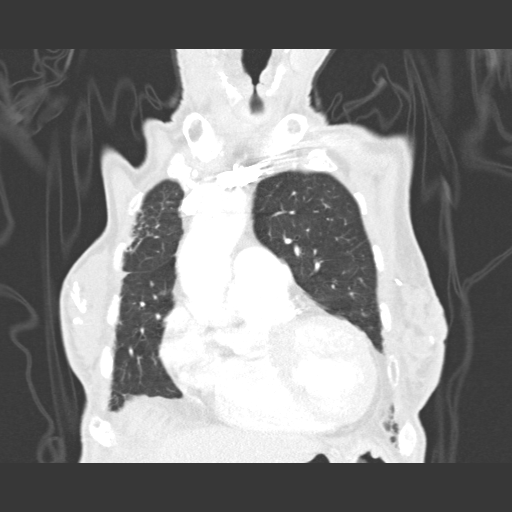
[im 45/113  lung]
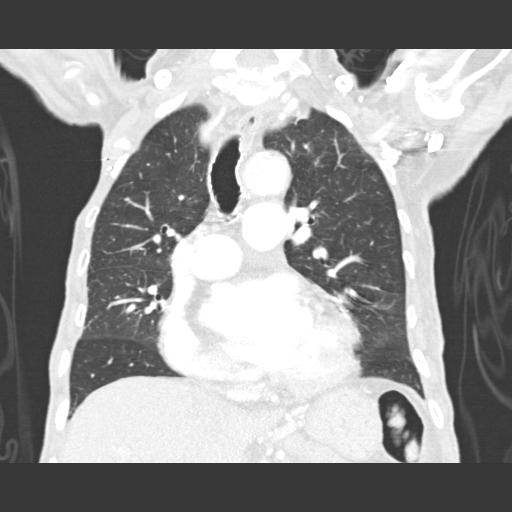
[im 68/113  lung]
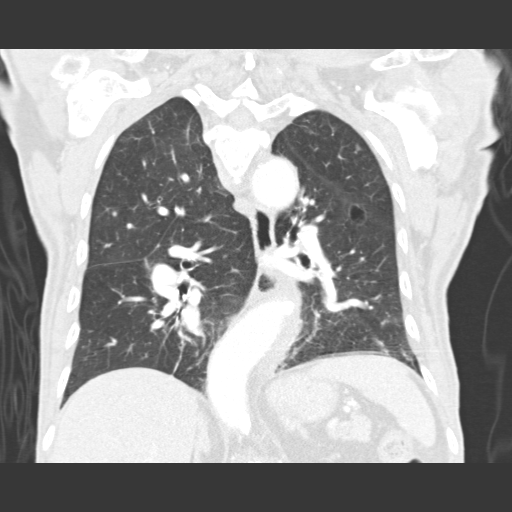

[15 of 36 positions shown; findings below may reference images not displayed]

FINDINGS: Cardiovascular: The heart is enlarged. Coronary artery calcification
is noted. Atherosclerotic calcification is noted in the wall of the
thoracic aorta. Pulmonary arterial enlargement suggests pulmonary
arterial hypertension.

Mediastinum/Nodes: No mediastinal lymphadenopathy. There is no hilar
lymphadenopathy. The esophagus has normal imaging features. There is
no axillary lymphadenopathy.

Lungs/Pleura: Emphysema noted bilaterally with associated bronchial
wall thickening. Subpleural interstitial disease in the right lung
is stable and likely related to prior radiation. 8 mm right lower
lobe pulmonary nodule seen previously is stable at 8 mm on 64 series
5. 4 mm left lower lobe nodule (image 40 series 5) is stable in the
interval. No focal airspace consolidation. No pulmonary edema. Tiny
right pleural effusion noted.

Upper Abdomen: 14 mm hypo attenuating lesion in the central liver
(image 122 series 2) is new in the interval. 8 mm low-density lesion
anterior right liver (image 124) is also new in the interval. 8 mm
hypo attenuating lesion lateral segment left liver is unchanged and
likely represents a cyst. Small upper pole cyst right kidney
unchanged.

Musculoskeletal: Widespread bony metastatic involvement again noted.
The left subpectoral irregular soft tissue attenuation seen on the
previous study is unchanged.
IMPRESSION: 1. Interval development of hypo attenuating lesions in the liver,
concerning for development of hepatic metastases. Dedicated
abdominal pelvic CT with contrast material may prove helpful to
further evaluate.
2. Stable bilateral lower lobe pulmonary nodules.
3. Stable appearance of the irregular subpectoral soft tissue
identified previously in the left anterior chest wall.

## 2017-02-11 ENCOUNTER — Other Ambulatory Visit: Payer: Self-pay | Admitting: Nurse Practitioner
